# Patient Record
Sex: Male | Born: 1970 | Race: White | Hispanic: No | Smoking: Never smoker
Health system: Southern US, Community
[De-identification: ages and names within clinical notes are randomized; demographics above are authoritative.]

## PROBLEM LIST (undated history)

## (undated) DIAGNOSIS — I10 Essential (primary) hypertension: Secondary | ICD-10-CM

## (undated) DIAGNOSIS — H5440 Blindness, one eye, unspecified eye: Secondary | ICD-10-CM

## (undated) DIAGNOSIS — I209 Angina pectoris, unspecified: Secondary | ICD-10-CM

## (undated) DIAGNOSIS — F419 Anxiety disorder, unspecified: Secondary | ICD-10-CM

## (undated) DIAGNOSIS — K219 Gastro-esophageal reflux disease without esophagitis: Secondary | ICD-10-CM

## (undated) DIAGNOSIS — E119 Type 2 diabetes mellitus without complications: Secondary | ICD-10-CM

## (undated) DIAGNOSIS — I219 Acute myocardial infarction, unspecified: Secondary | ICD-10-CM

## (undated) DIAGNOSIS — E785 Hyperlipidemia, unspecified: Secondary | ICD-10-CM

## (undated) DIAGNOSIS — E113599 Type 2 diabetes mellitus with proliferative diabetic retinopathy without macular edema, unspecified eye: Secondary | ICD-10-CM

## (undated) DIAGNOSIS — H269 Unspecified cataract: Secondary | ICD-10-CM

## (undated) DIAGNOSIS — M7500 Adhesive capsulitis of unspecified shoulder: Secondary | ICD-10-CM

## (undated) HISTORY — DX: Blindness, one eye, unspecified eye: H54.40

## (undated) HISTORY — DX: Essential (primary) hypertension: I10

## (undated) HISTORY — DX: Unspecified cataract: H26.9

## (undated) HISTORY — DX: Adhesive capsulitis of unspecified shoulder: M75.00

## (undated) HISTORY — DX: Hyperlipidemia, unspecified: E78.5

## (undated) HISTORY — DX: Gastro-esophageal reflux disease without esophagitis: K21.9

## (undated) HISTORY — DX: Type 2 diabetes mellitus without complications: E11.9

## (undated) HISTORY — PX: CORONARY ARTERY BYPASS GRAFT: SHX141

## (undated) HISTORY — PX: APPENDECTOMY: SHX54

## (undated) HISTORY — DX: Acute myocardial infarction, unspecified: I21.9

## (undated) HISTORY — PX: EYE SURGERY: SHX253

## (undated) HISTORY — DX: Anxiety disorder, unspecified: F41.9

## (undated) HISTORY — PX: CARPAL TUNNEL RELEASE: SHX101

## (undated) HISTORY — DX: Type 2 diabetes mellitus with proliferative diabetic retinopathy without macular edema, unspecified eye: E11.3599

---

## 2004-05-24 ENCOUNTER — Inpatient Hospital Stay: Payer: Self-pay | Admitting: Surgery

## 2004-08-09 ENCOUNTER — Emergency Department: Payer: Self-pay | Admitting: General Practice

## 2005-06-03 ENCOUNTER — Ambulatory Visit: Payer: Self-pay | Admitting: Orthopedic Surgery

## 2006-03-30 ENCOUNTER — Inpatient Hospital Stay: Payer: Self-pay | Admitting: Internal Medicine

## 2007-08-11 HISTORY — PX: SHOULDER SURGERY: SHX246

## 2010-04-15 ENCOUNTER — Ambulatory Visit: Payer: Self-pay | Admitting: Family Medicine

## 2012-08-10 HISTORY — PX: OTHER SURGICAL HISTORY: SHX169

## 2013-05-29 DIAGNOSIS — E109 Type 1 diabetes mellitus without complications: Secondary | ICD-10-CM | POA: Insufficient documentation

## 2013-07-05 DIAGNOSIS — H2510 Age-related nuclear cataract, unspecified eye: Secondary | ICD-10-CM | POA: Insufficient documentation

## 2015-02-04 ENCOUNTER — Other Ambulatory Visit: Payer: Self-pay | Admitting: Family Medicine

## 2015-02-04 ENCOUNTER — Telehealth: Payer: Self-pay | Admitting: Family Medicine

## 2015-02-04 NOTE — Telephone Encounter (Signed)
Pt called needs refill on Tramadol. Pharm is [facility]. Thanks.

## 2015-02-05 MED ORDER — TRAMADOL HCL 50 MG PO TABS: 50.0000 mg | ORAL_TABLET | Freq: Four times a day (QID) | ORAL | Status: DC | PRN

## 2015-02-05 NOTE — Telephone Encounter (Signed)
rx

## 2015-02-05 NOTE — Telephone Encounter (Signed)
rx printed

## 2015-02-26 ENCOUNTER — Telehealth: Payer: Self-pay | Admitting: Family Medicine

## 2015-02-26 NOTE — Telephone Encounter (Signed)
Pt came in and stated that he had been notified that he has jury duty and he says that due to his condition he's unable to sit through it. Pt stated that for the first time the court is asking a hard copy of his excuse/doctors note. Pt would like a call back at [phone] when the letter/note can be picked up.

## 2015-03-08 ENCOUNTER — Telehealth: Payer: Self-pay

## 2015-03-08 NOTE — Telephone Encounter (Signed)
Pt called stated he needs refill on BP medication. Pharm is [facility]. Thanks.

## 2015-03-11 DIAGNOSIS — IMO0002 Reserved for concepts with insufficient information to code with codable children: Secondary | ICD-10-CM | POA: Insufficient documentation

## 2015-03-11 DIAGNOSIS — I10 Essential (primary) hypertension: Secondary | ICD-10-CM | POA: Insufficient documentation

## 2015-03-11 DIAGNOSIS — E1059 Type 1 diabetes mellitus with other circulatory complications: Secondary | ICD-10-CM | POA: Insufficient documentation

## 2015-03-11 DIAGNOSIS — K219 Gastro-esophageal reflux disease without esophagitis: Secondary | ICD-10-CM | POA: Insufficient documentation

## 2015-03-11 DIAGNOSIS — E1065 Type 1 diabetes mellitus with hyperglycemia: Secondary | ICD-10-CM | POA: Insufficient documentation

## 2015-03-11 DIAGNOSIS — E108 Type 1 diabetes mellitus with unspecified complications: Secondary | ICD-10-CM

## 2015-03-11 DIAGNOSIS — E785 Hyperlipidemia, unspecified: Secondary | ICD-10-CM | POA: Insufficient documentation

## 2015-03-11 NOTE — Telephone Encounter (Signed)
Diovan Rx to [facility]

## 2015-04-03 ENCOUNTER — Other Ambulatory Visit: Payer: Self-pay | Admitting: Family Medicine

## 2015-04-10 ENCOUNTER — Telehealth: Payer: Self-pay | Admitting: Family Medicine

## 2015-04-10 ENCOUNTER — Other Ambulatory Visit: Payer: Self-pay | Admitting: Family Medicine

## 2015-04-10 MED ORDER — TRAMADOL HCL 50 MG PO TABS: 100.0000 mg | ORAL_TABLET | Freq: Four times a day (QID) | ORAL | Status: DC | PRN

## 2015-04-10 NOTE — Telephone Encounter (Signed)
Tramadol has historically been written for 2 Tab Q6H - patient has run out Please send new Rx to [facility]

## 2015-04-10 NOTE — Telephone Encounter (Signed)
Pt called in and needed to clear up confusion with his tramadol rx, he thinks [facility] but hes not sure

## 2015-04-30 ENCOUNTER — Other Ambulatory Visit: Payer: Self-pay | Admitting: Family Medicine

## 2015-05-28 ENCOUNTER — Other Ambulatory Visit: Payer: Self-pay | Admitting: Family Medicine

## 2015-05-28 ENCOUNTER — Encounter: Payer: Self-pay | Admitting: Family Medicine

## 2015-05-28 ENCOUNTER — Ambulatory Visit (INDEPENDENT_AMBULATORY_CARE_PROVIDER_SITE_OTHER): Payer: 59 | Admitting: Family Medicine

## 2015-05-28 VITALS — BP 129/77 | HR 83 | Temp 98.5°F | Ht 65.2 in | Wt 196.0 lb

## 2015-05-28 DIAGNOSIS — R140 Abdominal distension (gaseous): Secondary | ICD-10-CM

## 2015-05-28 DIAGNOSIS — E1051 Type 1 diabetes mellitus with diabetic peripheral angiopathy without gangrene: Secondary | ICD-10-CM | POA: Insufficient documentation

## 2015-05-28 DIAGNOSIS — K219 Gastro-esophageal reflux disease without esophagitis: Secondary | ICD-10-CM | POA: Diagnosis not present

## 2015-05-28 DIAGNOSIS — E113599 Type 2 diabetes mellitus with proliferative diabetic retinopathy without macular edema, unspecified eye: Secondary | ICD-10-CM | POA: Insufficient documentation

## 2015-05-28 DIAGNOSIS — I10 Essential (primary) hypertension: Secondary | ICD-10-CM | POA: Diagnosis not present

## 2015-05-28 DIAGNOSIS — E108 Type 1 diabetes mellitus with unspecified complications: Secondary | ICD-10-CM

## 2015-05-28 DIAGNOSIS — E1065 Type 1 diabetes mellitus with hyperglycemia: Secondary | ICD-10-CM | POA: Diagnosis not present

## 2015-05-28 DIAGNOSIS — E785 Hyperlipidemia, unspecified: Secondary | ICD-10-CM | POA: Diagnosis not present

## 2015-05-28 DIAGNOSIS — Z23 Encounter for immunization: Secondary | ICD-10-CM | POA: Diagnosis not present

## 2015-05-28 DIAGNOSIS — IMO0002 Reserved for concepts with insufficient information to code with codable children: Secondary | ICD-10-CM

## 2015-05-28 DIAGNOSIS — E103553 Type 1 diabetes mellitus with stable proliferative diabetic retinopathy, bilateral: Secondary | ICD-10-CM | POA: Insufficient documentation

## 2015-05-28 DIAGNOSIS — R109 Unspecified abdominal pain: Secondary | ICD-10-CM

## 2015-05-28 DIAGNOSIS — E103599 Type 1 diabetes mellitus with proliferative diabetic retinopathy without macular edema, unspecified eye: Secondary | ICD-10-CM

## 2015-05-28 DIAGNOSIS — Z0000 Encounter for general adult medical examination without abnormal findings: Secondary | ICD-10-CM

## 2015-05-28 LAB — URINALYSIS, ROUTINE W REFLEX MICROSCOPIC
Bilirubin, UA: NEGATIVE
Ketones, UA: NEGATIVE
LEUKOCYTES UA: NEGATIVE
Nitrite, UA: NEGATIVE
PH UA: 5 (ref 5.0–7.5)
Protein, UA: NEGATIVE
Specific Gravity, UA: 1.005 (ref 1.005–1.030)
Urobilinogen, Ur: 0.2 mg/dL (ref 0.2–1.0)

## 2015-05-28 LAB — BAYER DCA HB A1C WAIVED: HB A1C (BAYER DCA - WAIVED): 9.4 % — ABNORMAL HIGH (ref <7.0)

## 2015-05-28 LAB — MICROSCOPIC EXAMINATION

## 2015-05-28 MED ORDER — INSULIN LISPRO 100 UNIT/ML (KWIKPEN): 20.0000 [IU] | PEN_INJECTOR | Freq: Three times a day (TID) | SUBCUTANEOUS | Status: DC

## 2015-05-28 MED ORDER — ESOMEPRAZOLE MAGNESIUM 40 MG PO PACK: 40.0000 mg | PACK | Freq: Every day | ORAL | Status: DC

## 2015-05-28 MED ORDER — VALSARTAN 320 MG PO TABS: 320.0000 mg | ORAL_TABLET | Freq: Every day | ORAL | Status: DC

## 2015-05-28 MED ORDER — INSULIN GLARGINE 100 UNIT/ML SOLOSTAR PEN: 60.0000 [IU] | PEN_INJECTOR | Freq: Every day | SUBCUTANEOUS | Status: DC

## 2015-05-28 MED ORDER — DAPAGLIFLOZIN PROPANEDIOL 10 MG PO TABS: 10.0000 mg | ORAL_TABLET | Freq: Every day | ORAL | Status: DC

## 2015-05-28 MED ORDER — ATORVASTATIN CALCIUM 20 MG PO TABS: 20.0000 mg | ORAL_TABLET | Freq: Every day | ORAL | Status: DC

## 2015-05-28 NOTE — Assessment & Plan Note (Signed)
Reviewed with patient's hemoglobin A1c today of 9.4 Discussed need for better control for life Will refer back to endocrinology to further optimize care She indicates he will also try harder

## 2015-05-28 NOTE — Progress Notes (Signed)
BP 129/77 mmHg  Pulse 83  Temp(Src) 98.5 F (36.9 C)  Ht 5' 5.2" (1.656 m)  Wt 196 lb (88.905 kg)  BMI 32.42 kg/m2  SpO2 99%   Subjective:    Patient ID: Mike Kline, male    DOB: [birth year], 44 y.o.   MRN: [id number]  HPI: DOMINICO ROD is a 44 y.o. male  Chief Complaint  Patient presents with  . Annual Exam   doing poorly diabetes going up and down adjusting insulin to carbohydrate intake Still having a great deal of difficulty adjusting insulin and getting good glucose control. 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   Relevant past medical, surgical, family and social history reviewed and updated as indicated. Interim medical history since our last visit reviewed. Allergies and medications reviewed and updated.  Other than noted above  Review of Systems  Constitutional: Negative.   HENT: Negative.   Eyes: Negative.   Respiratory: Negative.   Cardiovascular: Negative.   Gastrointestinal: Negative.   Endocrine: Positive for polyuria.  Genitourinary: Negative.   Musculoskeletal: Positive for joint swelling and gait problem.       Patient unaware if he has claudication as he can't walk very much  Skin: Negative.   Allergic/Immunologic: Negative.   Neurological: Negative.   Hematological: Negative.   Psychiatric/Behavioral: Negative.     Per HPI unless specifically indicated above     Objective:    BP 129/77 mmHg  Pulse 83  Temp(Src) 98.5 F (36.9 C)  Ht 5' 5.2" (1.656 m)  Wt 196 lb (88.905 kg)  BMI 32.42 kg/m2  SpO2 99%  Wt Readings from Last 3 Encounters:  05/28/15 196 lb (88.905 kg)  11/22/14 189 lb (85.73 kg)    Physical Exam  Constitutional: He is oriented to person, place, and time. He appears  well-developed and well-nourished.  HENT:  Head: Normocephalic and atraumatic.  Right Ear: External ear normal.  Left Ear: External ear normal.  Eyes: Conjunctivae and EOM are normal. Pupils are equal, round, and reactive to light.  Neck: Normal range of motion. Neck supple.  Cardiovascular: Normal rate, regular rhythm, normal heart sounds and intact distal pulses.   Pulmonary/Chest: Effort normal and breath sounds normal.  Abdominal: Soft. Bowel sounds are normal. There is no splenomegaly or hepatomegaly.  Genitourinary: Rectum normal, prostate normal and penis normal.  Musculoskeletal:  Patient very stiff and inflexible unable to bend enough to reach his feet. Absent peripheral pulses  Neurological: He is alert and oriented to person, place, and time. He has normal reflexes.  Markedly decreased sensation both feet  Skin: No rash noted. No erythema.  Psychiatric: He has a normal mood and affect. His behavior is normal. Judgment and thought content normal.    No results found for this or any previous visit.    Assessment & Plan:   Problem List Items Addressed This Visit      Cardiovascular and Mediastinum   Hypertension    The current medical regimen is effective;  continue present plan and medications.       Relevant Medications   atorvastatin (LIPITOR) 20 MG tablet   valsartan (DIOVAN) 320 MG tablet   Diabetes mellitus type 1 with peripheral artery disease (HCC)    Patient with absent  peripheral pulses will refer to [place] [facility] to further evaluate for PAD      Relevant Medications   atorvastatin (LIPITOR) 20 MG tablet   dapagliflozin propanediol (FARXIGA) 10 MG TABS tablet   insulin lispro (HUMALOG KWIKPEN) 100 UNIT/ML KiwkPen   Insulin Glargine (LANTUS SOLOSTAR) 100 UNIT/ML Solostar Pen   valsartan (DIOVAN) 320 MG tablet   Other Relevant Orders   Ambulatory referral to Vascular Surgery     Digestive   GERD (gastroesophageal reflux disease)     Discuss abdominal pain will start with abdominal ultrasound      Relevant Medications   esomeprazole (NEXIUM) 40 MG packet   Other Relevant Orders   US Abdomen Complete     Endocrine   Type I diabetes mellitus with manifestations, uncontrolled (HCC)    Reviewed with patient's hemoglobin A1c today of 9.4 Discussed need for better control for life Will refer back to endocrinology to further optimize care She indicates he will also try harder       Relevant Medications   atorvastatin (LIPITOR) 20 MG tablet   dapagliflozin propanediol (FARXIGA) 10 MG TABS tablet   insulin lispro (HUMALOG KWIKPEN) 100 UNIT/ML KiwkPen   Insulin Glargine (LANTUS SOLOSTAR) 100 UNIT/ML Solostar Pen   valsartan (DIOVAN) 320 MG tablet   Other Relevant Orders   Ambulatory referral to Endocrinology     Other   Hyperlipidemia    The current medical regimen is effective;  continue present plan and medications.       Relevant Medications   atorvastatin (LIPITOR) 20 MG tablet   valsartan (DIOVAN) 320 MG tablet   Diabetic, retinopathy, proliferative (HCC)   Relevant Medications   atorvastatin (LIPITOR) 20 MG tablet   dapagliflozin propanediol (FARXIGA) 10 MG TABS tablet   insulin lispro (HUMALOG KWIKPEN) 100 UNIT/ML KiwkPen   Insulin Glargine (LANTUS SOLOSTAR) 100 UNIT/ML Solostar Pen   valsartan (DIOVAN) 320 MG tablet   Other Relevant Orders   Bayer DCA Hb A1c Waived    Other Visit Diagnoses    Immunization due    -  Primary    Relevant Orders    Flu Vaccine QUAD 36+ mos PF IM (Fluarix & Fluzone Quad PF) (Completed)    PE (physical exam), annual        Relevant Orders    Comprehensive metabolic panel    Lipid panel    CBC with Differential/Platelet    Urinalysis, Routine w reflex microscopic (not at [facility])    TSH        Follow up plan: No Follow-up on file.

## 2015-05-28 NOTE — Assessment & Plan Note (Signed)
Patient with absent peripheral pulses will refer to [place] [facility] to further evaluate for PAD

## 2015-05-28 NOTE — Assessment & Plan Note (Signed)
Discuss abdominal pain will start with abdominal ultrasound

## 2015-05-28 NOTE — Assessment & Plan Note (Signed)
The current medical regimen is effective;  continue present plan and medications.  

## 2015-05-29 ENCOUNTER — Encounter: Payer: Self-pay | Admitting: Family Medicine

## 2015-05-29 LAB — COMPREHENSIVE METABOLIC PANEL
ALBUMIN: 4.3 g/dL (ref 3.5–5.5)
ALT: 25 IU/L (ref 0–44)
AST: 24 IU/L (ref 0–40)
Albumin/Globulin Ratio: 1.8 (ref 1.1–2.5)
Alkaline Phosphatase: 109 IU/L (ref 39–117)
BUN / CREAT RATIO: 14 (ref 9–20)
BUN: 15 mg/dL (ref 6–24)
Bilirubin Total: 0.2 mg/dL (ref 0.0–1.2)
CALCIUM: 9.4 mg/dL (ref 8.7–10.2)
CO2: 24 mmol/L (ref 18–29)
CREATININE: 1.05 mg/dL (ref 0.76–1.27)
Chloride: 101 mmol/L (ref 97–106)
GFR, EST AFRICAN AMERICAN: 98 mL/min/{1.73_m2} (ref >59)
GFR, EST NON AFRICAN AMERICAN: 85 mL/min/{1.73_m2} (ref >59)
GLOBULIN, TOTAL: 2.4 g/dL (ref 1.5–4.5)
GLUCOSE: 125 mg/dL — AB (ref 65–99)
Potassium: 4.3 mmol/L (ref 3.5–5.2)
SODIUM: 139 mmol/L (ref 136–144)
TOTAL PROTEIN: 6.7 g/dL (ref 6.0–8.5)

## 2015-05-29 LAB — CBC WITH DIFFERENTIAL/PLATELET
BASOS ABS: 0 10*3/uL (ref 0.0–0.2)
BASOS: 0 %
EOS (ABSOLUTE): 0.2 10*3/uL (ref 0.0–0.4)
Eos: 3 %
Hematocrit: 43.9 % (ref 37.5–51.0)
Hemoglobin: 14.3 g/dL (ref 12.6–17.7)
IMMATURE GRANS (ABS): 0 10*3/uL (ref 0.0–0.1)
IMMATURE GRANULOCYTES: 1 %
LYMPHS: 33 %
Lymphocytes Absolute: 1.9 10*3/uL (ref 0.7–3.1)
MCH: 27.1 pg (ref 26.6–33.0)
MCHC: 32.6 g/dL (ref 31.5–35.7)
MCV: 83 fL (ref 79–97)
MONOS ABS: 0.3 10*3/uL (ref 0.1–0.9)
Monocytes: 5 %
NEUTROS PCT: 58 %
Neutrophils Absolute: 3.5 10*3/uL (ref 1.4–7.0)
PLATELETS: 270 10*3/uL (ref 150–379)
RBC: 5.28 x10E6/uL (ref 4.14–5.80)
RDW: 14.3 % (ref 12.3–15.4)
WBC: 5.9 10*3/uL (ref 3.4–10.8)

## 2015-05-29 LAB — LIPID PANEL
CHOL/HDL RATIO: 3.8 ratio (ref 0.0–5.0)
CHOLESTEROL TOTAL: 204 mg/dL — AB (ref 100–199)
HDL: 54 mg/dL (ref >39)
LDL CALC: 133 mg/dL — AB (ref 0–99)
Triglycerides: 87 mg/dL (ref 0–149)
VLDL CHOLESTEROL CAL: 17 mg/dL (ref 5–40)

## 2015-05-29 LAB — TSH: TSH: 1.57 u[IU]/mL (ref 0.450–4.500)

## 2015-06-24 ENCOUNTER — Telehealth: Payer: Self-pay | Admitting: Family Medicine

## 2015-06-24 MED ORDER — TRAMADOL HCL 50 MG PO TABS: 100.0000 mg | ORAL_TABLET | Freq: Four times a day (QID) | ORAL | Status: DC | PRN

## 2015-06-24 NOTE — Telephone Encounter (Signed)
Forward to provider

## 2015-06-24 NOTE — Telephone Encounter (Signed)
Pt needs Tramadol sent to [facility].

## 2015-06-25 ENCOUNTER — Ambulatory Visit: Payer: 59 | Admitting: Family Medicine

## 2015-07-02 ENCOUNTER — Ambulatory Visit: Payer: 59 | Admitting: Family Medicine

## 2015-07-15 ENCOUNTER — Ambulatory Visit: Payer: Self-pay

## 2015-07-23 ENCOUNTER — Encounter: Payer: Self-pay | Admitting: Family Medicine

## 2015-07-23 ENCOUNTER — Telehealth: Payer: Self-pay

## 2015-07-23 ENCOUNTER — Ambulatory Visit (INDEPENDENT_AMBULATORY_CARE_PROVIDER_SITE_OTHER): Payer: 59 | Admitting: Family Medicine

## 2015-07-23 VITALS — BP 113/71 | HR 86 | Temp 98.5°F | Ht 65.2 in | Wt 199.0 lb

## 2015-07-23 DIAGNOSIS — R1012 Left upper quadrant pain: Secondary | ICD-10-CM

## 2015-07-23 DIAGNOSIS — J019 Acute sinusitis, unspecified: Secondary | ICD-10-CM

## 2015-07-23 DIAGNOSIS — J329 Chronic sinusitis, unspecified: Secondary | ICD-10-CM | POA: Insufficient documentation

## 2015-07-23 DIAGNOSIS — R109 Unspecified abdominal pain: Secondary | ICD-10-CM | POA: Insufficient documentation

## 2015-07-23 MED ORDER — DEXLANSOPRAZOLE 60 MG PO CPDR: 60.0000 mg | DELAYED_RELEASE_CAPSULE | Freq: Every day | ORAL | Status: DC

## 2015-07-23 MED ORDER — AMOXICILLIN 875 MG PO TABS: 875.0000 mg | ORAL_TABLET | Freq: Two times a day (BID) | ORAL | Status: DC

## 2015-07-23 NOTE — Progress Notes (Signed)
BP 113/71 mmHg  Pulse 86  Temp(Src) 98.5 F (36.9 C)  Ht 5' 5.2" (1.656 m)  Wt 199 lb (90.266 kg)  BMI 32.92 kg/m2  SpO2 99%   Subjective:    Patient ID: Mike Kline, male    DOB: 05/18/1971, 44 y.o.   MRN: [id number]  HPI: Mike Kline is a 44 y.o. male  Chief Complaint  Patient presents with  . URI  . Gastroesophageal Reflux   patient recheck has several issues For 2-3 weeks been having a lot of head cold drainage congestion and facial pain feeling bad cough cold some low-grade fever and getting worse.  Patient's abdominal pain continuing has to take Tums and excellent to keep pain at bay had been doing a little better and canceled ultrasounds but is going to reschedule that  Relevant past medical, surgical, family and social history reviewed and updated as indicated. Interim medical history since our last visit reviewed. Allergies and medications reviewed and updated.  Review of Systems  Constitutional: Positive for fatigue. Negative for fever.  HENT: Positive for congestion, rhinorrhea, sinus pressure, sneezing and sore throat.   Respiratory: Positive for cough.   Cardiovascular: Negative.   Gastrointestinal: Positive for abdominal pain and abdominal distention.    Per HPI unless specifically indicated above     Objective:    BP 113/71 mmHg  Pulse 86  Temp(Src) 98.5 F (36.9 C)  Ht 5' 5.2" (1.656 m)  Wt 199 lb (90.266 kg)  BMI 32.92 kg/m2  SpO2 99%  Wt Readings from Last 3 Encounters:  07/23/15 199 lb (90.266 kg)  05/28/15 196 lb (88.905 kg)  11/22/14 189 lb (85.73 kg)    Physical Exam  Constitutional: He is oriented to person, place, and time. He appears well-developed and well-nourished. No distress.  HENT:  Head: Normocephalic and atraumatic.  Right Ear: Hearing and external ear normal.  Left Ear: Hearing and external ear normal.  Nose: Nose normal.  Mouth/Throat: Oropharyngeal exudate present.  Eyes: Conjunctivae and lids are normal. Right eye  exhibits no discharge. Left eye exhibits no discharge. No scleral icterus.  Pulmonary/Chest: Effort normal. No respiratory distress.  Abdominal:  Abdomen tender left upper quadrant  Musculoskeletal: Normal range of motion.  Neurological: He is alert and oriented to person, place, and time.  Skin: Skin is intact. No rash noted.  Psychiatric: He has a normal mood and affect. His speech is normal and behavior is normal. Judgment and thought content normal. Cognition and memory are normal.    Results for orders placed or performed in visit on 05/28/15  Microscopic Examination  Result Value Ref Range   WBC, UA 0-5 0 -  5 /hpf   RBC, UA 0-2 0 -  2 /hpf   Epithelial Cells (non renal) 0-10 0 - 10 /hpf   Bacteria, UA Few None seen/Few  Bayer DCA Hb A1c Waived  Result Value Ref Range   Bayer DCA Hb A1c Waived 9.4 (H) <7.0 %  Comprehensive metabolic panel  Result Value Ref Range   Glucose 125 (H) 65 - 99 mg/dL   BUN 15 6 - 24 mg/dL   Creatinine, Ser 7.821.05 0.76 - 1.27 mg/dL   GFR calc non Af Amer 85 >59 mL/min/1.73   GFR calc Af Amer 98 >59 mL/min/1.73   BUN/Creatinine Ratio 14 9 - 20   Sodium 139 136 - 144 mmol/L   Potassium 4.3 3.5 - 5.2 mmol/L   Chloride 101 97 - 106 mmol/L   CO2  24 18 - 29 mmol/L   Calcium 9.4 8.7 - 10.2 mg/dL   Total Protein 6.7 6.0 - 8.5 g/dL   Albumin 4.3 3.5 - 5.5 g/dL   Globulin, Total 2.4 1.5 - 4.5 g/dL   Albumin/Globulin Ratio 1.8 1.1 - 2.5   Bilirubin Total 0.2 0.0 - 1.2 mg/dL   Alkaline Phosphatase 109 39 - 117 IU/L   AST 24 0 - 40 IU/L   ALT 25 0 - 44 IU/L  Lipid panel  Result Value Ref Range   Cholesterol, Total 204 (H) 100 - 199 mg/dL   Triglycerides 87 0 - 149 mg/dL   HDL 54 >16 mg/dL   VLDL Cholesterol Cal 17 5 - 40 mg/dL   LDL Calculated 109 (H) 0 - 99 mg/dL   Chol/HDL Ratio 3.8 0.0 - 5.0 ratio units  CBC with Differential/Platelet  Result Value Ref Range   WBC 5.9 3.4 - 10.8 x10E3/uL   RBC 5.28 4.14 - 5.80 x10E6/uL   Hemoglobin 14.3 12.6 -  17.7 g/dL   Hematocrit 60.4 54.0 - 51.0 %   MCV 83 79 - 97 fL   MCH 27.1 26.6 - 33.0 pg   MCHC 32.6 31.5 - 35.7 g/dL   RDW 98.1 19.1 - 47.8 %   Platelets 270 150 - 379 x10E3/uL   Neutrophils 58 %   Lymphs 33 %   Monocytes 5 %   Eos 3 %   Basos 0 %   Neutrophils Absolute 3.5 1.4 - 7.0 x10E3/uL   Lymphocytes Absolute 1.9 0.7 - 3.1 x10E3/uL   Monocytes Absolute 0.3 0.1 - 0.9 x10E3/uL   EOS (ABSOLUTE) 0.2 0.0 - 0.4 x10E3/uL   Basophils Absolute 0.0 0.0 - 0.2 x10E3/uL   Immature Granulocytes 1 %   Immature Grans (Abs) 0.0 0.0 - 0.1 x10E3/uL  Urinalysis, Routine w reflex microscopic (not at [facility])  Result Value Ref Range   Specific Gravity, UA 1.005 1.005 - 1.030   pH, UA 5.0 5.0 - 7.5   Color, UA Yellow Yellow   Appearance Ur Clear Clear   Leukocytes, UA Negative Negative   Protein, UA Negative Negative/Trace   Glucose, UA 3+ (A) Negative   Ketones, UA Negative Negative   RBC, UA Trace (A) Negative   Bilirubin, UA Negative Negative   Urobilinogen, Ur 0.2 0.2 - 1.0 mg/dL   Nitrite, UA Negative Negative   Microscopic Examination See below:   TSH  Result Value Ref Range   TSH 1.570 0.450 - 4.500 uIU/mL      Assessment & Plan:   Problem List Items Addressed This Visit      Respiratory   Sinusitis    Discussed care and treatment of sinusitis as of over-the-counter medications Antibiotics      Relevant Medications   amoxicillin (AMOXIL) 875 MG tablet     Other   Abdominal pain - Primary    Discuss abdominal pain will order H pylori test Proceed with ultrasound If all negative will consider abdominal CT      Relevant Orders   Helicobacter pylori abs-IgG+IgA, bld       Follow up plan: Return in about 3 months (around 10/21/2015) for BMP, lipid panel, AST, ALT.

## 2015-07-23 NOTE — Telephone Encounter (Signed)
Rx request for Dexilant DR 60 mg caps.  Dexilant is NOT on the formulary. The rx was originally written 11/14/2014. It was last filled 05/16/2015.  You will have to reorder this Rx & override notice about it not being covered.

## 2015-07-23 NOTE — Assessment & Plan Note (Signed)
Discuss abdominal pain will order H pylori test Proceed with ultrasound If all negative will consider abdominal CT

## 2015-07-23 NOTE — Assessment & Plan Note (Signed)
Discussed care and treatment of sinusitis as of over-the-counter medications Antibiotics

## 2015-07-25 LAB — HELICOBACTER PYLORI ABS-IGG+IGA, BLD

## 2015-07-26 ENCOUNTER — Ambulatory Visit
Admission: RE | Admit: 2015-07-26 | Discharge: 2015-07-26 | Disposition: A | Discharge status: Home / Self Care | Payer: 59 | Source: Ambulatory Visit | Attending: Family Medicine | Admitting: Family Medicine

## 2015-07-26 DIAGNOSIS — K219 Gastro-esophageal reflux disease without esophagitis: Secondary | ICD-10-CM | POA: Diagnosis not present

## 2015-07-26 DIAGNOSIS — R140 Abdominal distension (gaseous): Secondary | ICD-10-CM

## 2015-07-26 DIAGNOSIS — R109 Unspecified abdominal pain: Secondary | ICD-10-CM | POA: Insufficient documentation

## 2015-07-29 ENCOUNTER — Telehealth: Payer: Self-pay | Admitting: Family Medicine

## 2015-07-29 DIAGNOSIS — R1012 Left upper quadrant pain: Secondary | ICD-10-CM

## 2015-07-29 NOTE — Telephone Encounter (Signed)
-----   Message from Lurlean HornsNancy H Wilson, CMA sent at 07/29/2015  5:08 PM EST ----- labs

## 2015-07-29 NOTE — Telephone Encounter (Signed)
Phone call Discussed with patient still having marked left upper quadrant abdominal pain ultrasound unrevealing essentially will schedule abdominal CT Most likely diagnosis though is gastroparesis

## 2015-08-09 ENCOUNTER — Ambulatory Visit
Admission: RE | Admit: 2015-08-09 | Discharge: 2015-08-09 | Disposition: A | Discharge status: Home / Self Care | Payer: 59 | Source: Ambulatory Visit | Attending: Family Medicine | Admitting: Family Medicine

## 2015-08-09 DIAGNOSIS — R1012 Left upper quadrant pain: Secondary | ICD-10-CM

## 2015-08-09 DIAGNOSIS — I2510 Atherosclerotic heart disease of native coronary artery without angina pectoris: Secondary | ICD-10-CM | POA: Insufficient documentation

## 2015-08-09 MED ORDER — IOHEXOL 350 MG/ML SOLN
100.0000 mL | Freq: Once | INTRAVENOUS | Status: AC | PRN
  Administered 2015-08-09: 100 mL via INTRAVENOUS

## 2015-08-13 ENCOUNTER — Telehealth: Payer: Self-pay | Admitting: Family Medicine

## 2015-08-13 NOTE — Telephone Encounter (Signed)
-----   Message from Lurlean HornsNancy H Wilson, CMA sent at 08/13/2015  5:11 PM EST ----- ct

## 2015-08-13 NOTE — Telephone Encounter (Signed)
On call Discussed with patient is artery gotten CT report from endocrinology and is using laxatives feeling much better

## 2015-08-16 ENCOUNTER — Telehealth: Payer: Self-pay | Admitting: Family Medicine

## 2015-08-16 NOTE — Telephone Encounter (Signed)
call

## 2015-08-16 NOTE — Telephone Encounter (Signed)
discuss cont care with uri and no more antibiotcs

## 2015-08-16 NOTE — Telephone Encounter (Signed)
Pt called stated his illness is still lingering wants to know if more antibiotics can be called in to [facility]. Thanks.

## 2015-10-07 ENCOUNTER — Telehealth: Payer: Self-pay | Admitting: Family Medicine

## 2015-10-07 NOTE — Telephone Encounter (Signed)
Pt called stated he needs a refill on Tramadol. Pharm is [facility].  Pt also needs Humalog sent to [facility].    Thanks.

## 2015-10-08 MED ORDER — TRAMADOL HCL 50 MG PO TABS: 100.0000 mg | ORAL_TABLET | Freq: Four times a day (QID) | ORAL | Status: DC | PRN

## 2015-10-08 NOTE — Telephone Encounter (Signed)
Pt called and informed RX is ready for pick up. Thanks.

## 2015-10-22 ENCOUNTER — Encounter: Payer: Self-pay | Admitting: Family Medicine

## 2015-10-22 ENCOUNTER — Telehealth: Payer: Self-pay | Admitting: Gastroenterology

## 2015-10-22 ENCOUNTER — Ambulatory Visit (INDEPENDENT_AMBULATORY_CARE_PROVIDER_SITE_OTHER): Payer: 59 | Admitting: Family Medicine

## 2015-10-22 VITALS — BP 121/74 | HR 84 | Temp 98.0°F | Ht 64.4 in | Wt 204.0 lb

## 2015-10-22 DIAGNOSIS — E7800 Pure hypercholesterolemia, unspecified: Secondary | ICD-10-CM | POA: Insufficient documentation

## 2015-10-22 DIAGNOSIS — I10 Essential (primary) hypertension: Secondary | ICD-10-CM | POA: Diagnosis not present

## 2015-10-22 DIAGNOSIS — E1051 Type 1 diabetes mellitus with diabetic peripheral angiopathy without gangrene: Secondary | ICD-10-CM

## 2015-10-22 DIAGNOSIS — E785 Hyperlipidemia, unspecified: Secondary | ICD-10-CM

## 2015-10-22 DIAGNOSIS — E103599 Type 1 diabetes mellitus with proliferative diabetic retinopathy without macular edema, unspecified eye: Secondary | ICD-10-CM | POA: Diagnosis not present

## 2015-10-22 DIAGNOSIS — R1012 Left upper quadrant pain: Secondary | ICD-10-CM | POA: Diagnosis not present

## 2015-10-22 LAB — LP+ALT+AST PICCOLO, WAIVED
ALT (SGPT) PICCOLO, WAIVED: 21 U/L (ref 10–47)
AST (SGOT) PICCOLO, WAIVED: 35 U/L (ref 11–38)
CHOL/HDL RATIO PICCOLO,WAIVE: 4.3 mg/dL
Cholesterol Piccolo, Waived: 182 mg/dL (ref <200)
HDL Chol Piccolo, Waived: 42 mg/dL — ABNORMAL LOW (ref >59)
LDL Chol Calc Piccolo Waived: 100 mg/dL — ABNORMAL HIGH (ref <100)
TRIGLYCERIDES PICCOLO,WAIVED: 196 mg/dL — AB (ref <150)
VLDL CHOL CALC PICCOLO,WAIVE: 39 mg/dL — AB (ref <30)

## 2015-10-22 LAB — BAYER DCA HB A1C WAIVED: HB A1C: 8.3 % — AB (ref <7.0)

## 2015-10-22 MED ORDER — ATORVASTATIN CALCIUM 40 MG PO TABS: 40.0000 mg | ORAL_TABLET | Freq: Every day | ORAL | Status: DC

## 2015-10-22 NOTE — Assessment & Plan Note (Signed)
Discuss poor control of cholesterol still will increase Lipitor from 20mg  to 40mg 

## 2015-10-22 NOTE — Assessment & Plan Note (Signed)
Has not appointment in a few weeks.

## 2015-10-22 NOTE — Assessment & Plan Note (Signed)
Continued nonspecific abdominal pain possibility of gastroparesis discussed Will refer to gastroenterology to further evaluate.

## 2015-10-22 NOTE — Telephone Encounter (Signed)
I have called patient to make an appointment per referral for left upper quadrant pain. No answer. I have left a message on voicemail. Patient will need an appointment with GI.

## 2015-10-22 NOTE — Assessment & Plan Note (Signed)
Followed by endocrinology 

## 2015-10-22 NOTE — Assessment & Plan Note (Signed)
The current medical regimen is effective;  continue present plan and medications.  

## 2015-10-22 NOTE — Progress Notes (Signed)
BP 121/74 mmHg  Pulse 84  Temp(Src) 98 F (36.7 C)  Ht 5' 4.4" (1.636 m)  Wt 204 lb (92.534 kg)  BMI 34.57 kg/m2  SpO2 96%   Subjective:    Patient ID: Mike Kline, male    DOB: Oct 27, 1970, 45 y.o.   MRN: [id number]  HPI: Mike Kline is a 45 y.o. male  Chief Complaint  Patient presents with  . Diabetes  . Hyperlipidemia  . Hypertension  . upper abdominal pain   follow-up having continued left upper quadrant abdominal pain which bothers him sometimes it's debilitating so severe last couple hours reviewed testing labs which were all normal.   Patient following with endocrinology for diabetes has appointment in a couple weeks once A1c done here so that it will be ready for his endocrinology appointment. Patient's been doing better with his insulin  And also doing well with blood pressure and cholesterol. Relevant past medical, surgical, family and social history reviewed and updated as indicated. Interim medical history since our last visit reviewed. Allergies and medications reviewed and updated.  Review of Systems  Constitutional: Negative.   Respiratory: Negative.   Cardiovascular: Negative.     Per HPI unless specifically indicated above     Objective:    BP 121/74 mmHg  Pulse 84  Temp(Src) 98 F (36.7 C)  Ht 5' 4.4" (1.636 m)  Wt 204 lb (92.534 kg)  BMI 34.57 kg/m2  SpO2 96%  Wt Readings from Last 3 Encounters:  10/22/15 204 lb (92.534 kg)  07/23/15 199 lb (90.266 kg)  05/28/15 196 lb (88.905 kg)    Physical Exam  Constitutional: He is oriented to person, place, and time. He appears well-developed and well-nourished. No distress.  HENT:  Head: Normocephalic and atraumatic.  Right Ear: Hearing normal.  Left Ear: Hearing normal.  Nose: Nose normal.  Eyes: Conjunctivae and lids are normal. Right eye exhibits no discharge. Left eye exhibits no discharge. No scleral icterus.  Cardiovascular: Normal rate, regular rhythm and normal heart sounds.    Pulmonary/Chest: Effort normal and breath sounds normal. No respiratory distress.  Musculoskeletal: Normal range of motion.  Neurological: He is alert and oriented to person, place, and time.  Skin: Skin is intact. No rash noted.  Psychiatric: He has a normal mood and affect. His speech is normal and behavior is normal. Judgment and thought content normal. Cognition and memory are normal.    Results for orders placed or performed in visit on 07/23/15  Helicobacter pylori abs-IgG+IgA, bld  Result Value Ref Range   H. pylori, IgA Abs <9.0 0.0 - 8.9 units      Assessment & Plan:   Problem List Items Addressed This Visit      Cardiovascular and Mediastinum   Hypertension    The current medical regimen is effective;  continue present plan and medications.       Relevant Medications   atorvastatin (LIPITOR) 40 MG tablet   Other Relevant Orders   LP+ALT+AST Piccolo, Waived   Bayer DCA Hb A1c Waived   Basic metabolic panel   Diabetes mellitus type 1 with peripheral artery disease (HCC) - Primary    Followed by endocrinology      Relevant Medications   atorvastatin (LIPITOR) 40 MG tablet   Other Relevant Orders   LP+ALT+AST Piccolo, Waived   Bayer DCA Hb A1c Waived   Basic metabolic panel     Other   Hyperlipidemia    Discuss poor control of cholesterol still will  increase Lipitor from 20mg  to 40mg       Relevant Medications   atorvastatin (LIPITOR) 40 MG tablet   Other Relevant Orders   LP+ALT+AST Piccolo, Waived   Bayer DCA Hb A1c Waived   Basic metabolic panel   Diabetic, retinopathy, proliferative (HCC)    Has not appointment in a few weeks.      Relevant Medications   atorvastatin (LIPITOR) 40 MG tablet   Abdominal pain    Continued nonspecific abdominal pain possibility of gastroparesis discussed Will refer to gastroenterology to further evaluate.      Relevant Orders   Ambulatory referral to Gastroenterology   Hypercholesterolemia   Relevant Medications    atorvastatin (LIPITOR) 40 MG tablet       Follow up plan: Return in about 6 months (around 04/23/2016) for Physical Exam.

## 2015-10-23 ENCOUNTER — Encounter: Payer: Self-pay | Admitting: Family Medicine

## 2015-10-23 LAB — BASIC METABOLIC PANEL
BUN/Creatinine Ratio: 14 (ref 9–20)
BUN: 13 mg/dL (ref 6–24)
CALCIUM: 9.5 mg/dL (ref 8.7–10.2)
CHLORIDE: 99 mmol/L (ref 96–106)
CO2: 23 mmol/L (ref 18–29)
Creatinine, Ser: 0.95 mg/dL (ref 0.76–1.27)
GFR calc non Af Amer: 96 mL/min/{1.73_m2} (ref >59)
GFR, EST AFRICAN AMERICAN: 111 mL/min/{1.73_m2} (ref >59)
Glucose: 65 mg/dL (ref 65–99)
POTASSIUM: 4.5 mmol/L (ref 3.5–5.2)
SODIUM: 140 mmol/L (ref 134–144)

## 2015-10-23 NOTE — Telephone Encounter (Signed)
appointment made with Dr Servando SnareWohl.

## 2015-10-30 ENCOUNTER — Other Ambulatory Visit: Payer: Self-pay | Admitting: Family Medicine

## 2015-11-11 ENCOUNTER — Other Ambulatory Visit: Payer: Self-pay | Admitting: Family Medicine

## 2015-11-11 ENCOUNTER — Telehealth: Payer: Self-pay

## 2015-11-11 NOTE — Telephone Encounter (Signed)
[facility]

## 2015-11-12 ENCOUNTER — Other Ambulatory Visit: Payer: Self-pay | Admitting: Family Medicine

## 2015-11-13 ENCOUNTER — Telehealth: Payer: Self-pay | Admitting: Family Medicine

## 2015-11-13 NOTE — Telephone Encounter (Signed)
Pt notified. Thanks.

## 2015-11-13 NOTE — Telephone Encounter (Signed)
Pt called stated he needs a refill on Tramadol. Please call when RX is ready for pick up. Thanks.

## 2015-11-21 ENCOUNTER — Encounter: Payer: Self-pay | Admitting: Gastroenterology

## 2015-11-21 ENCOUNTER — Ambulatory Visit (INDEPENDENT_AMBULATORY_CARE_PROVIDER_SITE_OTHER): Payer: 59 | Admitting: Gastroenterology

## 2015-11-21 VITALS — BP 139/70 | HR 80 | Temp 98.2°F | Ht 64.0 in | Wt 202.0 lb

## 2015-11-21 DIAGNOSIS — R109 Unspecified abdominal pain: Secondary | ICD-10-CM | POA: Diagnosis not present

## 2015-11-21 NOTE — Progress Notes (Signed)
in   Gastroenterology Consultation  Referring Provider:     Steele Sizer, MD Primary Care Physician:  Vonita Moss, MD Primary Gastroenterologist:  Dr. Servando Snare     Reason for Consultation:      Abdominal pain        HPI:   Mike Kline is a 45 y.o. y/o male referred for consultation & management of  Abdominal pain by Dr. Vonita Moss, MD.   This patient comes in today with a report of abdominal pain in the left side of his abdomen. The patient states that the pain is worse when he moves around. He also states it is worse when he mows his lawn.  The patient has multiple joint and muscle pain. There is no report of the pain getting worse with eating or drinking. The patient also denies any change in bowel habits. The patient suffers from adhesive capsulitis. There is no report of any unexplained weight loss.  The patient has had imaging that has not show any cause for his symptoms. The patient also reports that he has bloating with only the left side of his abdomen protruding.  Past Medical History  Diagnosis Date  . Hypertension   . Adhesive capsulitis     permanent disability 07/2010, left shoulder  . Hyperlipidemia   . GERD (gastroesophageal reflux disease)   . Diabetic, retinopathy, proliferative (HCC)   . Blindness of left eye   . Diabetes mellitus without complication [facility])     Past Surgical History  Procedure Laterality Date  . Appendectomy    . Carpal tunnel release Bilateral   . Eye surgery Left   . Cataract surgery Bilateral 2014    Prior to Admission medications   Medication Sig Start Date End Date Taking? Authorizing Provider  atorvastatin (LIPITOR) 40 MG tablet Take 1 tablet (40 mg total) by mouth daily. 10/22/15  Yes Steele Sizer, MD  dapagliflozin propanediol (FARXIGA) 10 MG TABS tablet Take 10 mg by mouth daily. 05/28/15  Yes Steele Sizer, MD  fexofenadine (ALLEGRA) 30 MG tablet Take 30 mg by mouth at bedtime.   Yes Historical Provider, MD  HUMALOG  KWIKPEN 100 UNIT/ML KiwkPen USE THREE TIMES A DAY AS DIRECTED.(UP TO 50 UNITS A DAY OR AS DIRECTED BY SLIDING SCALE) 11/11/15  Yes Steele Sizer, MD  Insulin Glargine (LANTUS SOLOSTAR) 100 UNIT/ML Solostar Pen Inject 60 Units into the skin daily at 10 pm. 05/28/15  Yes Steele Sizer, MD  lansoprazole (PREVACID) 30 MG capsule Take 30 mg by mouth daily at 12 noon.   Yes Historical Provider, MD  metFORMIN (GLUCOPHAGE-XR) 500 MG 24 hr tablet 1,000 mg daily. Reported on 10/22/2015 07/10/15  Yes Historical Provider, MD  ranitidine (ZANTAC) 150 MG tablet Take 150 mg by mouth 2 (two) times daily.   Yes Historical Provider, MD  traMADol (ULTRAM) 50 MG tablet TAKE 2 TABLETS EVERY 6 HOURS AS NEEDED FOR PAIN 11/11/15  Yes Steele Sizer, MD  valsartan (DIOVAN) 320 MG tablet TAKE 1 TABLET DAILY. 10/30/15  Yes Megan Holly Bodily, DO    History reviewed. No pertinent family history.   Social History  Substance Use Topics  . Smoking status: Never Smoker   . Smokeless tobacco: Never Used  . Alcohol Use: No    Allergies as of 11/21/2015  . (No Known Allergies)    Review of Systems:    All systems reviewed and negative except where noted in HPI.   Physical Exam:  BP 139/70  mmHg  Pulse 80  Temp(Src) 98.2 F (36.8 C) (Oral)  Ht 5\' 4"  (1.626 m)  Wt 202 lb (91.627 kg)  BMI 34.66 kg/m2 No LMP for male patient. Psych:  Alert and cooperative. Normal mood and affect. General:   Alert,  Well-developed, well-nourished, pleasant and cooperative in NAD Head:  Normocephalic and atraumatic. Eyes:  Sclera clear, no icterus.   Conjunctiva pink. Ears:  Normal auditory acuity. Nose:  No deformity, discharge, or lesions. Mouth:  No deformity or lesions,oropharynx pink & moist. Neck:  Supple; no masses or thyromegaly. Lungs:  Respirations even and unlabored.  Clear throughout to auscultation.   No wheezes, crackles, or rhonchi. No acute distress. Heart:  Regular rate and rhythm; no murmurs, clicks, rubs, or  gallops. Abdomen:  Normal bowel sounds.  No bruits.  Soft,Tenderness to one finger palpation while flexing the abdominal wall muscles, and non-distended without masses, hepatosplenomegaly or hernias noted.  No guarding or rebound tenderness.  Negative Carnett sign.   Rectal:  Deferred.  Msk:  Multiple scars on his arms. The range of motion of his knees and shoulder are restricted Pulses:  Normal pulses noted. Extremities:  No clubbing or edema.  No cyanosis. Neurologic:  Alert and oriented x3;  grossly normal neurologically. Skin:  Intact without significant lesions or rashes.  No jaundice. Lymph Nodes:  No significant cervical adenopathy. Psych:  Alert and cooperative. Normal mood and affect.  Imaging Studies: No results found.  Assessment and Plan:   Mike Kline is a 45 y.o. y/o male who comes in today with abdominal pain that is consistent with muscular pain. There are no G.I. Components of the pain. It is unlikely to be related to his diabetes since he is not having any nausea or vomiting. The patient has chronic muscular and joint pain. The patient has a positive Carnett Sign. He reports that a started after he was taking up his son extensively to put him to sleep at night.  The patient has been told to use warm compresses to his left abdomen and he should consider wearing a support belt. The patient has been explained the plan and agrees with it.   Note: This dictation was prepared with Dragon dictation along with smaller phrase technology. Any transcriptional errors that result from this process are unintentional.

## 2015-12-02 ENCOUNTER — Other Ambulatory Visit: Payer: Self-pay | Admitting: Family Medicine

## 2015-12-02 NOTE — Telephone Encounter (Signed)
Your patient 

## 2015-12-13 ENCOUNTER — Other Ambulatory Visit: Payer: Self-pay | Admitting: Family Medicine

## 2015-12-16 ENCOUNTER — Telehealth: Payer: Self-pay | Admitting: Family Medicine

## 2015-12-16 ENCOUNTER — Other Ambulatory Visit: Payer: Self-pay | Admitting: Family Medicine

## 2015-12-16 NOTE — Telephone Encounter (Signed)
Pt called stated he needs a refill on Tramadol. Pharm [facility]. Pt has 4 pills left. Thanks.

## 2015-12-17 ENCOUNTER — Other Ambulatory Visit: Payer: Self-pay | Admitting: Family Medicine

## 2015-12-17 ENCOUNTER — Other Ambulatory Visit: Payer: Self-pay

## 2015-12-17 DIAGNOSIS — M7500 Adhesive capsulitis of unspecified shoulder: Secondary | ICD-10-CM | POA: Insufficient documentation

## 2015-12-17 NOTE — Telephone Encounter (Signed)
Pt called and asked that we double check this, pt stated he had last refill was on 11/13/15. Pt upset, stated he is almost out of this medication. Please call pt ASAP. Thanks.

## 2015-12-17 NOTE — Telephone Encounter (Signed)
Pt called and stated he contacted the Pharmacy and they stated it was not too soon for a refill on Tramadol. Pt stated pharmacy stated the problem is its always prescribed with no refills. Thanks.

## 2015-12-17 NOTE — Telephone Encounter (Signed)
Phone call Discussed with patient high-dose tramadol will need to be prescribed by pain clinic discuss lower dose tramadol which patient is interested in Patient will taper tramadol 1 pill a week to get down to 4 a day. Give refill still then

## 2015-12-17 NOTE — Assessment & Plan Note (Signed)
Takes tramadol high-dose

## 2015-12-30 ENCOUNTER — Other Ambulatory Visit: Payer: Self-pay | Admitting: Family Medicine

## 2016-01-03 ENCOUNTER — Other Ambulatory Visit: Payer: Self-pay

## 2016-01-03 DIAGNOSIS — IMO0002 Reserved for concepts with insufficient information to code with codable children: Secondary | ICD-10-CM

## 2016-01-03 DIAGNOSIS — E1065 Type 1 diabetes mellitus with hyperglycemia: Secondary | ICD-10-CM

## 2016-01-03 DIAGNOSIS — E108 Type 1 diabetes mellitus with unspecified complications: Principal | ICD-10-CM

## 2016-01-03 NOTE — Telephone Encounter (Signed)
[facility],  [facility]

## 2016-01-07 MED ORDER — INSULIN GLARGINE 100 UNIT/ML SOLOSTAR PEN: 60.0000 [IU] | PEN_INJECTOR | Freq: Every day | SUBCUTANEOUS | Status: DC

## 2016-02-27 ENCOUNTER — Other Ambulatory Visit: Payer: Self-pay | Admitting: Family Medicine

## 2016-02-28 MED ORDER — TRAMADOL HCL 50 MG PO TABS: 50.0000 mg | ORAL_TABLET | Freq: Three times a day (TID) | ORAL | Status: DC | PRN

## 2016-02-28 NOTE — Telephone Encounter (Signed)
I will fill #[phone] until [facility] is back.

## 2016-03-04 ENCOUNTER — Other Ambulatory Visit: Payer: Self-pay | Admitting: Family Medicine

## 2016-03-04 ENCOUNTER — Telehealth: Payer: Self-pay | Admitting: Family Medicine

## 2016-03-04 MED ORDER — TRAMADOL HCL 50 MG PO TABS: ORAL_TABLET | ORAL | 1 refills | Status: DC

## 2016-03-04 NOTE — Telephone Encounter (Signed)
printed

## 2016-03-04 NOTE — Telephone Encounter (Signed)
Called patient, Rx for Tramadol was taken over to [facility], left VM

## 2016-04-08 ENCOUNTER — Other Ambulatory Visit: Payer: Self-pay | Admitting: Family Medicine

## 2016-04-09 ENCOUNTER — Telehealth: Payer: Self-pay | Admitting: Family Medicine

## 2016-04-09 NOTE — Telephone Encounter (Signed)
Pt came in for a refill on Tramadol. Pt was informed he would need to wait for Dr. Dossie Arbourrissman to return 04/14/16. Patient became very angry stated he would never come back to this office again. Fleet ContrasRachel stated she did not feel comfortable refilling this medication. Stated he would talk to the Print production plannerffice Manager. Thanks.

## 2016-04-14 ENCOUNTER — Other Ambulatory Visit: Payer: Self-pay | Admitting: Family Medicine

## 2016-04-14 ENCOUNTER — Telehealth: Payer: Self-pay | Admitting: Family Medicine

## 2016-04-14 NOTE — Telephone Encounter (Signed)
Pt would like to speak to PCP about traMADol (ULTRAM) 50 MG tablet

## 2016-04-14 NOTE — Telephone Encounter (Signed)
Call pt 

## 2016-04-14 NOTE — Progress Notes (Signed)
Discuss  with patient over use tramadol patient's gone through withdrawal doesn't ever want to take another tramadol again or a controlled drug. Discussed diabetic peripheral neuropathy care and treatment suggested Cymbalta Will observe for now.

## 2016-04-16 ENCOUNTER — Telehealth: Payer: Self-pay

## 2016-04-16 MED ORDER — PRAMIPEXOLE DIHYDROCHLORIDE 0.125 MG PO TABS
0.1250 mg | ORAL_TABLET | Freq: Every evening | ORAL | 3 refills | Status: DC
Start: 2016-04-16 — End: 2016-05-28

## 2016-04-16 NOTE — Telephone Encounter (Signed)
Call pt 

## 2016-04-16 NOTE — Telephone Encounter (Signed)
[facility] patient requesting refill for   Pramipexole 0.125mg  Tab  Rx not in patient's current med list

## 2016-04-16 NOTE — Telephone Encounter (Signed)
Phone call Discussed with patient has used Mirapex in the past for the symptoms and leg symptoms will try again.

## 2016-04-16 NOTE — Telephone Encounter (Signed)
No answer will call back.

## 2016-04-16 NOTE — Addendum Note (Signed)
Addended byVonita Moss: Fatimah Sundquist on: 04/16/2016 05:22 PM   Modules accepted: Orders

## 2016-04-28 ENCOUNTER — Encounter (INDEPENDENT_AMBULATORY_CARE_PROVIDER_SITE_OTHER): Payer: Self-pay

## 2016-05-28 ENCOUNTER — Ambulatory Visit (INDEPENDENT_AMBULATORY_CARE_PROVIDER_SITE_OTHER): Payer: 59 | Admitting: Family Medicine

## 2016-05-28 ENCOUNTER — Encounter: Payer: Self-pay | Admitting: Family Medicine

## 2016-05-28 VITALS — BP 161/83 | HR 85 | Ht 64.0 in | Wt 202.0 lb

## 2016-05-28 DIAGNOSIS — M7502 Adhesive capsulitis of left shoulder: Secondary | ICD-10-CM | POA: Diagnosis not present

## 2016-05-28 DIAGNOSIS — Z0000 Encounter for general adult medical examination without abnormal findings: Secondary | ICD-10-CM

## 2016-05-28 DIAGNOSIS — J019 Acute sinusitis, unspecified: Secondary | ICD-10-CM | POA: Diagnosis not present

## 2016-05-28 DIAGNOSIS — E1051 Type 1 diabetes mellitus with diabetic peripheral angiopathy without gangrene: Secondary | ICD-10-CM

## 2016-05-28 DIAGNOSIS — I10 Essential (primary) hypertension: Secondary | ICD-10-CM | POA: Diagnosis not present

## 2016-05-28 DIAGNOSIS — M7501 Adhesive capsulitis of right shoulder: Secondary | ICD-10-CM

## 2016-05-28 DIAGNOSIS — E7800 Pure hypercholesterolemia, unspecified: Secondary | ICD-10-CM | POA: Diagnosis not present

## 2016-05-28 DIAGNOSIS — G4700 Insomnia, unspecified: Secondary | ICD-10-CM

## 2016-05-28 LAB — URINALYSIS, ROUTINE W REFLEX MICROSCOPIC
BILIRUBIN UA: NEGATIVE
Ketones, UA: NEGATIVE
Leukocytes, UA: NEGATIVE
Nitrite, UA: NEGATIVE
PH UA: 6 (ref 5.0–7.5)
Protein, UA: NEGATIVE
RBC, UA: NEGATIVE
Specific Gravity, UA: <1.005 — ABNORMAL LOW (ref 1.005–1.030)
UUROB: 0.2 mg/dL (ref 0.2–1.0)

## 2016-05-28 LAB — MICROALBUMIN, URINE WAIVED
Creatinine, Urine Waived: 10 mg/dL (ref 10–300)
MICROALB, UR WAIVED: 10 mg/L (ref 0–19)

## 2016-05-28 MED ORDER — ATORVASTATIN CALCIUM 40 MG PO TABS: 40.0000 mg | ORAL_TABLET | Freq: Every day | ORAL | 4 refills | Status: DC

## 2016-05-28 MED ORDER — AMOXICILLIN-POT CLAVULANATE 875-125 MG PO TABS
1.0000 | ORAL_TABLET | Freq: Two times a day (BID) | ORAL | 0 refills | Status: DC
Start: 2016-05-28 — End: 2016-06-19

## 2016-05-28 MED ORDER — PRAMIPEXOLE DIHYDROCHLORIDE 0.125 MG PO TABS: 0.1250 mg | ORAL_TABLET | Freq: Every evening | ORAL | 4 refills | Status: DC

## 2016-05-28 MED ORDER — TRAZODONE HCL 50 MG PO TABS: 25.0000 mg | ORAL_TABLET | Freq: Every evening | ORAL | 3 refills | Status: DC | PRN

## 2016-05-28 MED ORDER — VALSARTAN 320 MG PO TABS: 320.0000 mg | ORAL_TABLET | Freq: Every day | ORAL | 4 refills | Status: DC

## 2016-05-28 NOTE — Assessment & Plan Note (Signed)
Discuss insomnia care and treatment will start with trazodone

## 2016-05-28 NOTE — Progress Notes (Signed)
BP (!) 161/83   Pulse 85   Ht '5\' 4"'  (1.626 m)   Wt 202 lb (91.6 kg)   SpO2 98%   BMI 34.67 kg/m    Subjective:    Patient ID: Mike Kline, male    DOB: [birth year], 45 y.o.   MRN: [id number]  HPI: Mike Kline is a 45 y.o. male  Chief Complaint  Patient presents with  . Annual Exam  . PHQ-9    SCORE:8  Patient pain doing okay taking 2 BC just about every day but gets good pain relief with that. Is able to work out and do [employer] training again which feels some much better. Biggest concern is not sleeping [place] stopped tramadol. Last A1c was 10 done at [facility] clinic endocrinology patient feels it should be much better now as he's done better this was during the problems of withdrawal from tramadol.  Has developed a head cold cough congestion Has been ongoing for 2 months just not going away with facial pressure feeling bad and heavy coughing especially at night. Having a great deal of thick mucus coming out of his head with pressure.  Pressure also elevated takes medications faithfully no issues or concerns chart review blood pressure also elevated endocrinology in August. Other blood pressure readings earlier in the year were normal. Has not been checking blood pressure outside of offices.  Relevant past medical, surgical, family and social history reviewed and updated as indicated. Interim medical history since our last visit reviewed. Allergies and medications reviewed and updated.  Other than patient's usual complaints Review of Systems  Constitutional: Negative.   HENT: Negative.   Eyes: Negative.   Respiratory: Negative.   Cardiovascular: Negative.   Gastrointestinal: Negative.   Endocrine: Negative.   Genitourinary: Negative.   Musculoskeletal: Negative.   Skin: Negative.   Allergic/Immunologic: Negative.   Neurological: Negative.   Hematological: Negative.   Psychiatric/Behavioral: Negative.     Per HPI unless specifically indicated above     Objective:      BP (!) 161/83   Pulse 85   Ht '5\' 4"'  (1.626 m)   Wt 202 lb (91.6 kg)   SpO2 98%   BMI 34.67 kg/m   Wt Readings from Last 3 Encounters:  05/28/16 202 lb (91.6 kg)  11/21/15 202 lb (91.6 kg)  10/22/15 204 lb (92.5 kg)    Physical Exam  Constitutional: He is oriented to person, place, and time. He appears well-developed and well-nourished.  HENT:  Head: Normocephalic and atraumatic.  Right Ear: External ear normal.  Left Ear: External ear normal.  Eyes: Conjunctivae and EOM are normal. Pupils are equal, round, and reactive to light.  Neck: Normal range of motion. Neck supple.  Cardiovascular: Normal rate, regular rhythm, normal heart sounds and intact distal pulses.   Pulmonary/Chest: Effort normal and breath sounds normal.  Abdominal: Soft. Bowel sounds are normal. There is no splenomegaly or hepatomegaly.  Genitourinary: Rectum normal, prostate normal and penis normal.  Musculoskeletal: Normal range of motion.  Neurological: He is alert and oriented to person, place, and time. He has normal reflexes.  Skin: No rash noted. No erythema.  Psychiatric: He has a normal mood and affect. His behavior is normal. Judgment and thought content normal.    Results for orders placed or performed in visit on 10/22/15  LP+ALT+AST Piccolo, [employer]  Result Value Ref Range   ALT (SGPT) Piccolo, Waived 21 10 - 47 U/L   AST (SGOT) Piccolo, Waived 35 11 -  38 U/L   Cholesterol Piccolo, Waived 182 <200 mg/dL   HDL Chol Piccolo, Waived 42 (L) >59 mg/dL   Triglycerides Piccolo,Waived 196 (H) <150 mg/dL   Chol/HDL Ratio Piccolo,Waive 4.3 mg/dL   LDL Chol Calc Piccolo Waived 100 (H) <100 mg/dL   VLDL Chol Calc Piccolo,Waive 39 (H) <30 mg/dL  Bayer DCA Hb A1c Waived  Result Value Ref Range   Bayer DCA Hb A1c Waived 8.3 (H) <0.1 %  Basic metabolic panel  Result Value Ref Range   Glucose 65 65 - 99 mg/dL   BUN 13 6 - 24 mg/dL   Creatinine, Ser 0.95 0.76 - 1.27 mg/dL   GFR calc non Af Amer 96 >59  mL/min/1.73   GFR calc Af Amer 111 >59 mL/min/1.73   BUN/Creatinine Ratio 14 9 - 20   Sodium 140 134 - 144 mmol/L   Potassium 4.5 3.5 - 5.2 mmol/L   Chloride 99 96 - 106 mmol/L   CO2 23 18 - 29 mmol/L   Calcium 9.5 8.7 - 10.2 mg/dL      Assessment & Plan:   Problem List Items Addressed This Visit      Cardiovascular and Mediastinum   Hypertension    Elevated blood pressure patient will do better with lifestyle will treat sinus infection if blood pressure stay and elevated will need to start medications then recheck a month later.      Relevant Medications   valsartan (DIOVAN) 320 MG tablet   atorvastatin (LIPITOR) 40 MG tablet   Diabetes mellitus type 1 with peripheral artery disease (HCC) - Primary    Followed by endo      Relevant Medications   valsartan (DIOVAN) 320 MG tablet   atorvastatin (LIPITOR) 40 MG tablet   Other Relevant Orders   CBC w/Diff   Comp Met (CMET)   Urinalysis, Routine w reflex microscopic   TSH   Lipid panel   PSA   Microalbumin, urine     Musculoskeletal and Integument   Adhesive capsulitis    stable        Other   Hyperlipidemia    The current medical regimen is effective;  continue present plan and medications.       Relevant Medications   valsartan (DIOVAN) 320 MG tablet   atorvastatin (LIPITOR) 40 MG tablet   Insomnia    Discuss insomnia care and treatment will start with trazodone       Other Visit Diagnoses    PE (physical exam), annual       Relevant Orders   CBC w/Diff   Comp Met (CMET)   Urinalysis, Routine w reflex microscopic   TSH   Lipid panel   PSA   Microalbumin, urine   Acute sinusitis, recurrence not specified, unspecified location       Discussed sinusitis care and treatment nasal rinse, Mucinex, use of antibiotics will treat with Augmentin patient education given   Relevant Medications   amoxicillin-clavulanate (AUGMENTIN) 875-125 MG tablet       Follow up plan: Return in about 6 months (around  11/26/2016) for BMP,  Lipids, ALT, AST.

## 2016-05-28 NOTE — Assessment & Plan Note (Signed)
Elevated blood pressure patient will do better with lifestyle will treat sinus infection if blood pressure stay and elevated will need to start medications then recheck a month later.

## 2016-05-28 NOTE — Assessment & Plan Note (Signed)
Followed by endo.  

## 2016-05-28 NOTE — Addendum Note (Signed)
Addended by: Luella CookMCINTYRE, DIRECE E on: 05/28/2016 03:42 PM   Modules accepted: Orders

## 2016-05-28 NOTE — Assessment & Plan Note (Signed)
The current medical regimen is effective;  continue present plan and medications.  

## 2016-05-28 NOTE — Assessment & Plan Note (Signed)
stable °

## 2016-05-29 LAB — COMPREHENSIVE METABOLIC PANEL
A/G RATIO: 1.8 (ref 1.2–2.2)
ALT: 27 IU/L (ref 0–44)
AST: 20 IU/L (ref 0–40)
Albumin: 4.6 g/dL (ref 3.5–5.5)
Alkaline Phosphatase: 110 IU/L (ref 39–117)
BILIRUBIN TOTAL: 0.3 mg/dL (ref 0.0–1.2)
BUN / CREAT RATIO: 15 (ref 9–20)
BUN: 13 mg/dL (ref 6–24)
CHLORIDE: 99 mmol/L (ref 96–106)
CO2: 24 mmol/L (ref 18–29)
Calcium: 9.6 mg/dL (ref 8.7–10.2)
Creatinine, Ser: 0.88 mg/dL (ref 0.76–1.27)
GFR calc non Af Amer: 102 mL/min/{1.73_m2} (ref >59)
GFR, EST AFRICAN AMERICAN: 118 mL/min/{1.73_m2} (ref >59)
Globulin, Total: 2.5 g/dL (ref 1.5–4.5)
Glucose: 163 mg/dL — ABNORMAL HIGH (ref 65–99)
POTASSIUM: 4.3 mmol/L (ref 3.5–5.2)
Sodium: 139 mmol/L (ref 134–144)
TOTAL PROTEIN: 7.1 g/dL (ref 6.0–8.5)

## 2016-05-29 LAB — CBC WITH DIFFERENTIAL/PLATELET
BASOS: 0 %
Basophils Absolute: 0 10*3/uL (ref 0.0–0.2)
EOS (ABSOLUTE): 0.1 10*3/uL (ref 0.0–0.4)
Eos: 1 %
Hematocrit: 41.5 % (ref 37.5–51.0)
Hemoglobin: 14 g/dL (ref 12.6–17.7)
IMMATURE GRANS (ABS): 0 10*3/uL (ref 0.0–0.1)
Immature Granulocytes: 0 %
LYMPHS: 16 %
Lymphocytes Absolute: 1.5 10*3/uL (ref 0.7–3.1)
MCH: 27.7 pg (ref 26.6–33.0)
MCHC: 33.7 g/dL (ref 31.5–35.7)
MCV: 82 fL (ref 79–97)
MONOS ABS: 0.5 10*3/uL (ref 0.1–0.9)
Monocytes: 6 %
Neutrophils Absolute: 6.8 10*3/uL (ref 1.4–7.0)
Neutrophils: 77 %
PLATELETS: 279 10*3/uL (ref 150–379)
RBC: 5.06 x10E6/uL (ref 4.14–5.80)
RDW: 13.6 % (ref 12.3–15.4)
WBC: 9 10*3/uL (ref 3.4–10.8)

## 2016-05-29 LAB — LIPID PANEL
CHOL/HDL RATIO: 2.7 ratio (ref 0.0–5.0)
Cholesterol, Total: 120 mg/dL (ref 100–199)
HDL: 44 mg/dL (ref >39)
LDL Calculated: 62 mg/dL (ref 0–99)
TRIGLYCERIDES: 71 mg/dL (ref 0–149)
VLDL Cholesterol Cal: 14 mg/dL (ref 5–40)

## 2016-05-29 LAB — TSH: TSH: 1.72 u[IU]/mL (ref 0.450–4.500)

## 2016-05-29 LAB — PSA: PROSTATE SPECIFIC AG, SERUM: 0.3 ng/mL (ref 0.0–4.0)

## 2016-06-01 ENCOUNTER — Encounter: Payer: Self-pay | Admitting: Family Medicine

## 2016-06-16 ENCOUNTER — Telehealth: Payer: Self-pay | Admitting: Family Medicine

## 2016-06-16 MED ORDER — AZITHROMYCIN 250 MG PO TABS: ORAL_TABLET | ORAL | 0 refills | Status: DC

## 2016-06-16 NOTE — Telephone Encounter (Signed)
Routing to provider  

## 2016-06-16 NOTE — Telephone Encounter (Signed)
rx sent

## 2016-06-16 NOTE — Telephone Encounter (Signed)
Pt called and stated that he still has a cough from his appt 05/28/16. He has completed the amoxicillin-clavulanate (AUGMENTIN) 875-125 MG tablet but the cough won't go away. [facility]

## 2016-06-19 ENCOUNTER — Ambulatory Visit (INDEPENDENT_AMBULATORY_CARE_PROVIDER_SITE_OTHER): Payer: 59 | Admitting: Family Medicine

## 2016-06-19 ENCOUNTER — Ambulatory Visit
Admission: RE | Admit: 2016-06-19 | Discharge: 2016-06-19 | Disposition: A | Discharge status: Home / Self Care | Payer: 59 | Source: Ambulatory Visit | Attending: Family Medicine | Admitting: Family Medicine

## 2016-06-19 ENCOUNTER — Telehealth: Payer: Self-pay | Admitting: Family Medicine

## 2016-06-19 ENCOUNTER — Encounter: Payer: Self-pay | Admitting: Family Medicine

## 2016-06-19 VITALS — BP 166/74 | HR 86 | Temp 98.2°F | Wt 202.8 lb

## 2016-06-19 DIAGNOSIS — R05 Cough: Secondary | ICD-10-CM

## 2016-06-19 DIAGNOSIS — I517 Cardiomegaly: Secondary | ICD-10-CM | POA: Insufficient documentation

## 2016-06-19 DIAGNOSIS — R059 Cough, unspecified: Secondary | ICD-10-CM

## 2016-06-19 DIAGNOSIS — R918 Other nonspecific abnormal finding of lung field: Secondary | ICD-10-CM | POA: Diagnosis not present

## 2016-06-19 MED ORDER — PREDNISONE 10 MG PO TABS: ORAL_TABLET | ORAL | 0 refills | Status: DC

## 2016-06-19 MED ORDER — BENZONATATE 100 MG PO CAPS: 100.0000 mg | ORAL_CAPSULE | Freq: Two times a day (BID) | ORAL | 0 refills | Status: DC | PRN

## 2016-06-19 MED ORDER — ALBUTEROL SULFATE (2.5 MG/3ML) 0.083% IN NEBU: 2.5000 mg | INHALATION_SOLUTION | Freq: Once | RESPIRATORY_TRACT | Status: DC

## 2016-06-19 NOTE — Telephone Encounter (Signed)
Patient notified

## 2016-06-19 NOTE — Progress Notes (Signed)
BP (!) 166/74 (BP Location: Left Arm, Patient Position: Sitting, Cuff Size: Large)   Pulse 86   Temp 98.2 F (36.8 C)   Wt 202 lb 12.8 oz (92 kg)   SpO2 99%   BMI 34.81 kg/m    Subjective:    Patient ID: Mike Kline, male    DOB: 03/03/1971, 45 y.o.   MRN: [id number]  HPI: Mike Kline is a 45 y.o. male  Chief Complaint  Patient presents with  . Cough   UPPER RESPIRATORY TRACT INFECTION Duration: 3 months Worst symptom: cough Fever: no Cough: yes Shortness of breath: yes Wheezing: yes Chest pain: yes, with cough Chest tightness: yes Chest congestion: no Nasal congestion: yes Runny nose: no Post nasal drip: no Sneezing: yes Sore throat: yes Swollen glands: no Sinus pressure: yes Headache: yes Face pain: yes Toothache: yes Ear pain: no  Ear pressure: no  Eyes red/itching:no Eye drainage/crusting: no  Vomiting: no Rash: no Fatigue: yes Sick contacts: yes Strep contacts: no  Context: worse Recurrent sinusitis: no Relief with OTC cold/cough medications: no  Treatments attempted: cold/sinus, mucinex, anti-histamine, pseudoephedrine, cough syrup and antibiotics   Relevant past medical, surgical, family and social history reviewed and updated as indicated. Interim medical history since our last visit reviewed. Allergies and medications reviewed and updated.  Review of Systems  Constitutional: Negative.   HENT: Positive for congestion, postnasal drip, rhinorrhea, sinus pressure and sneezing. Negative for dental problem, drooling, ear discharge, ear pain, facial swelling, hearing loss, mouth sores, nosebleeds, sinus pain, sore throat, tinnitus, trouble swallowing and voice change.   Respiratory: Positive for cough, shortness of breath and wheezing. Negative for apnea, choking, chest tightness and stridor.   Cardiovascular: Negative.   Psychiatric/Behavioral: Negative.     Per HPI unless specifically indicated above     Objective:    BP (!) 166/74 (BP  Location: Left Arm, Patient Position: Sitting, Cuff Size: Large)   Pulse 86   Temp 98.2 F (36.8 C)   Wt 202 lb 12.8 oz (92 kg)   SpO2 99%   BMI 34.81 kg/m   Wt Readings from Last 3 Encounters:  06/19/16 202 lb 12.8 oz (92 kg)  05/28/16 202 lb (91.6 kg)  11/21/15 202 lb (91.6 kg)    Physical Exam  Constitutional: He is oriented to person, place, and time. He appears well-developed and well-nourished. No distress.  HENT:  Head: Normocephalic and atraumatic.  Right Ear: Hearing and external ear normal.  Left Ear: Hearing and external ear normal.  Nose: Nose normal.  Mouth/Throat: Oropharynx is clear and moist. No oropharyngeal exudate.  Eyes: Conjunctivae, EOM and lids are normal. Pupils are equal, round, and reactive to light. Right eye exhibits no discharge. Left eye exhibits no discharge. No scleral icterus.  Neck: Normal range of motion. Neck supple. No JVD present. No tracheal deviation present. No thyromegaly present.  Cardiovascular: Normal rate, regular rhythm, normal heart sounds and intact distal pulses.  Exam reveals no gallop and no friction rub.   No murmur heard. Pulmonary/Chest: Effort normal and breath sounds normal. No stridor. No respiratory distress. He has no wheezes. He has no rales. He exhibits no tenderness.  Barking cough  Musculoskeletal: Normal range of motion.  Lymphadenopathy:    He has no cervical adenopathy.  Neurological: He is alert and oriented to person, place, and time.  Skin: Skin is warm, dry and intact. No rash noted. He is not diaphoretic. No erythema. No pallor.  Psychiatric: He has a  normal mood and affect. His speech is normal and behavior is normal. Judgment and thought content normal. Cognition and memory are normal.  Nursing note and vitals reviewed.   Results for orders placed or performed in visit on 05/28/16  CBC w/Diff  Result Value Ref Range   WBC 9.0 3.4 - 10.8 x10E3/uL   RBC 5.06 4.14 - 5.80 x10E6/uL   Hemoglobin 14.0 12.6 -  17.7 g/dL   Hematocrit 41.5 37.5 - 51.0 %   MCV 82 79 - 97 fL   MCH 27.7 26.6 - 33.0 pg   MCHC 33.7 31.5 - 35.7 g/dL   RDW 13.6 12.3 - 15.4 %   Platelets 279 150 - 379 x10E3/uL   Neutrophils 77 Not Estab. %   Lymphs 16 Not Estab. %   Monocytes 6 Not Estab. %   Eos 1 Not Estab. %   Basos 0 Not Estab. %   Neutrophils Absolute 6.8 1.4 - 7.0 x10E3/uL   Lymphocytes Absolute 1.5 0.7 - 3.1 x10E3/uL   Monocytes Absolute 0.5 0.1 - 0.9 x10E3/uL   EOS (ABSOLUTE) 0.1 0.0 - 0.4 x10E3/uL   Basophils Absolute 0.0 0.0 - 0.2 x10E3/uL   Immature Granulocytes 0 Not Estab. %   Immature Grans (Abs) 0.0 0.0 - 0.1 x10E3/uL  Comp Met (CMET)  Result Value Ref Range   Glucose 163 (H) 65 - 99 mg/dL   BUN 13 6 - 24 mg/dL   Creatinine, Ser 0.88 0.76 - 1.27 mg/dL   GFR calc non Af Amer 102 >59 mL/min/1.73   GFR calc Af Amer 118 >59 mL/min/1.73   BUN/Creatinine Ratio 15 9 - 20   Sodium 139 134 - 144 mmol/L   Potassium 4.3 3.5 - 5.2 mmol/L   Chloride 99 96 - 106 mmol/L   CO2 24 18 - 29 mmol/L   Calcium 9.6 8.7 - 10.2 mg/dL   Total Protein 7.1 6.0 - 8.5 g/dL   Albumin 4.6 3.5 - 5.5 g/dL   Globulin, Total 2.5 1.5 - 4.5 g/dL   Albumin/Globulin Ratio 1.8 1.2 - 2.2   Bilirubin Total 0.3 0.0 - 1.2 mg/dL   Alkaline Phosphatase 110 39 - 117 IU/L   AST 20 0 - 40 IU/L   ALT 27 0 - 44 IU/L  Urinalysis, Routine w reflex microscopic  Result Value Ref Range   Specific Gravity, UA <1.005 (L) 1.005 - 1.030   pH, UA 6.0 5.0 - 7.5   Color, UA Yellow Yellow   Appearance Ur Clear Clear   Leukocytes, UA Negative Negative   Protein, UA Negative Negative/Trace   Glucose, UA 1+ (A) Negative   Ketones, UA Negative Negative   RBC, UA Negative Negative   Bilirubin, UA Negative Negative   Urobilinogen, Ur 0.2 0.2 - 1.0 mg/dL   Nitrite, UA Negative Negative  TSH  Result Value Ref Range   TSH 1.720 0.450 - 4.500 uIU/mL  Lipid panel  Result Value Ref Range   Cholesterol, Total 120 100 - 199 mg/dL   Triglycerides 71 0 -  149 mg/dL   HDL 44 >39 mg/dL   VLDL Cholesterol Cal 14 5 - 40 mg/dL   LDL Calculated 62 0 - 99 mg/dL   Chol/HDL Ratio 2.7 0.0 - 5.0 ratio units  PSA  Result Value Ref Range   Prostate Specific Ag, Serum 0.3 0.0 - 4.0 ng/mL  Microalbumin, Urine Waived  Result Value Ref Range   Microalb, Ur Waived 10 0 - 19 mg/L   Creatinine, Urine Waived  10 10 - 300 mg/dL   Microalb/Creat Ratio 30-300 (H) <30 mg/g      Assessment & Plan:   Problem List Items Addressed This Visit    None    Visit Diagnoses    Cough    -  Primary   No better with abx, lungs clear, no sign of bacterial infection. Slightly better after neb. Will start him on prednisone and tessalon perles. Recheck 2 weeks.   Relevant Medications   albuterol (PROVENTIL) (2.5 MG/3ML) 0.083% nebulizer solution 2.5 mg   Other Relevant Orders   DG Chest 2 View       Follow up plan: Return in about 2 weeks (around 07/03/2016) for Lung recheck and BP check with spiro.

## 2016-06-19 NOTE — Telephone Encounter (Signed)
Please let him know that his x-ray was normal. Thanks!

## 2016-06-23 ENCOUNTER — Telehealth: Payer: Self-pay | Admitting: Family Medicine

## 2016-06-23 ENCOUNTER — Emergency Department: Payer: 59

## 2016-06-23 ENCOUNTER — Emergency Department
Admission: EM | Admit: 2016-06-23 | Discharge: 2016-06-23 | Disposition: A | Discharge status: Home / Self Care | Payer: 59 | Attending: Emergency Medicine | Admitting: Emergency Medicine

## 2016-06-23 DIAGNOSIS — J40 Bronchitis, not specified as acute or chronic: Secondary | ICD-10-CM | POA: Diagnosis not present

## 2016-06-23 DIAGNOSIS — Z79899 Other long term (current) drug therapy: Secondary | ICD-10-CM | POA: Diagnosis not present

## 2016-06-23 DIAGNOSIS — I10 Essential (primary) hypertension: Secondary | ICD-10-CM | POA: Diagnosis not present

## 2016-06-23 DIAGNOSIS — E109 Type 1 diabetes mellitus without complications: Secondary | ICD-10-CM | POA: Insufficient documentation

## 2016-06-23 DIAGNOSIS — R05 Cough: Secondary | ICD-10-CM | POA: Diagnosis present

## 2016-06-23 DIAGNOSIS — Z7984 Long term (current) use of oral hypoglycemic drugs: Secondary | ICD-10-CM | POA: Diagnosis not present

## 2016-06-23 LAB — BASIC METABOLIC PANEL
ANION GAP: 10 (ref 5–15)
BUN: 12 mg/dL (ref 6–20)
CALCIUM: 8.8 mg/dL — AB (ref 8.9–10.3)
CO2: 24 mmol/L (ref 22–32)
Chloride: 101 mmol/L (ref 101–111)
Creatinine, Ser: 1.01 mg/dL (ref 0.61–1.24)
GFR calc Af Amer: >60 mL/min (ref >60)
GLUCOSE: 184 mg/dL — AB (ref 65–99)
Potassium: 3.5 mmol/L (ref 3.5–5.1)
Sodium: 135 mmol/L (ref 135–145)

## 2016-06-23 LAB — CBC
HEMATOCRIT: 42.1 % (ref 40.0–52.0)
HEMOGLOBIN: 14.2 g/dL (ref 13.0–18.0)
MCH: 27.9 pg (ref 26.0–34.0)
MCHC: 33.8 g/dL (ref 32.0–36.0)
MCV: 82.4 fL (ref 80.0–100.0)
Platelets: 265 10*3/uL (ref 150–440)
RBC: 5.11 MIL/uL (ref 4.40–5.90)
RDW: 13.2 % (ref 11.5–14.5)
WBC: 12.8 10*3/uL — ABNORMAL HIGH (ref 3.8–10.6)

## 2016-06-23 MED ORDER — IPRATROPIUM-ALBUTEROL 0.5-2.5 (3) MG/3ML IN SOLN
3.0000 mL | Freq: Once | RESPIRATORY_TRACT | Status: AC
  Administered 2016-06-23: 3 mL via RESPIRATORY_TRACT

## 2016-06-23 MED ORDER — HYDROCOD POLST-CPM POLST ER 10-8 MG/5ML PO SUER
ORAL | Status: AC
  Filled 2016-06-23: qty 5

## 2016-06-23 MED ORDER — HYDROCOD POLST-CPM POLST ER 10-8 MG/5ML PO SUER
5.0000 mL | Freq: Once | ORAL | Status: AC
  Administered 2016-06-23: 5 mL via ORAL

## 2016-06-23 MED ORDER — KETOROLAC TROMETHAMINE 60 MG/2ML IM SOLN
15.0000 mg | Freq: Once | INTRAMUSCULAR | Status: AC
  Administered 2016-06-23: 15 mg via INTRAMUSCULAR

## 2016-06-23 MED ORDER — ALBUTEROL SULFATE (2.5 MG/3ML) 0.083% IN NEBU
5.0000 mg | INHALATION_SOLUTION | Freq: Once | RESPIRATORY_TRACT | Status: AC
  Administered 2016-06-23: 5 mg via RESPIRATORY_TRACT

## 2016-06-23 MED ORDER — ALBUTEROL SULFATE (2.5 MG/3ML) 0.083% IN NEBU
INHALATION_SOLUTION | RESPIRATORY_TRACT | Status: AC
  Filled 2016-06-23: qty 6

## 2016-06-23 MED ORDER — KETOROLAC TROMETHAMINE 30 MG/ML IJ SOLN
INTRAMUSCULAR | Status: AC
  Filled 2016-06-23: qty 1

## 2016-06-23 MED ORDER — HYDROCOD POLST-CPM POLST ER 10-8 MG/5ML PO SUER: 5.0000 mL | Freq: Four times a day (QID) | ORAL | 0 refills | Status: DC | PRN

## 2016-06-23 MED ORDER — ALBUTEROL SULFATE HFA 108 (90 BASE) MCG/ACT IN AERS: 2.0000 | INHALATION_SPRAY | RESPIRATORY_TRACT | 0 refills | Status: DC | PRN

## 2016-06-23 MED ORDER — IPRATROPIUM-ALBUTEROL 0.5-2.5 (3) MG/3ML IN SOLN
RESPIRATORY_TRACT | Status: AC
  Filled 2016-06-23: qty 3

## 2016-06-23 MED ORDER — DOXYCYCLINE HYCLATE 100 MG PO CAPS: 100.0000 mg | ORAL_CAPSULE | Freq: Two times a day (BID) | ORAL | 0 refills | Status: DC

## 2016-06-23 NOTE — ED Notes (Signed)
Pt reports he has had non productive cough since Saturday.

## 2016-06-23 NOTE — Telephone Encounter (Signed)
Patient had x-ray done 4 days ago which did not show pneumonia. If he is getting worse, please have him come in to see someone to see if he needs another antibiotic.

## 2016-06-23 NOTE — ED Provider Notes (Signed)
[facility] Emergency Department Provider Note  ____________________________________________  Time seen: Approximately 5:46 PM  I have reviewed the triage vital signs and the nursing notes.   HISTORY  Chief Complaint Cough and Shortness of Breath    HPI Mike Kline is a 45 y.o. male who complains of frequent nonproductive coughing fits over the past week. Prior to that he had had a URI that seemed viral with sore throat and rhinorrhea. Afterward he developed forceful rapid coughing. He completed a course of Augmentin 2 weeks ago from his primary care doctor for sinusitis. He then was given a course of azithromycin which he finished 3 days ago. Denies fevers or chills. No chest pain except for some discomfort in the left lower chest when he coughs very hard. Feels little bit wheezy and mildly short of breath. Has a history of diabetes, takes his medications. Has an appointment with his primary care doctor tomorrow. No recent travel trauma hospitalization or surgery. No history of DVT or PE    Past Medical History:  Diagnosis Date  . Adhesive capsulitis    permanent disability 07/2010, left shoulder  . Blindness of left eye   . Diabetes mellitus without complication (HCC)   . Diabetic, retinopathy, proliferative (HCC)   . GERD (gastroesophageal reflux disease)   . Hyperlipidemia   . Hypertension      Patient Active Problem List   Diagnosis Date Noted  . Insomnia 05/28/2016  . Adhesive capsulitis 12/17/2015  . Hypercholesterolemia 10/22/2015  . Abdominal pain 07/23/2015  . Diabetic, retinopathy, proliferative (HCC) 05/28/2015  . Diabetes mellitus type 1 with peripheral artery disease (HCC) 05/28/2015  . Hypertension   . Type I diabetes mellitus with manifestations, uncontrolled (HCC)   . Hyperlipidemia   . GERD (gastroesophageal reflux disease)   . Nuclear sclerotic cataract 07/05/2013  . Type 1 diabetes mellitus (HCC) 05/29/2013     Past  Surgical History:  Procedure Laterality Date  . APPENDECTOMY    . CARPAL TUNNEL RELEASE Bilateral   . cataract surgery Bilateral 2014  . EYE SURGERY Left      Prior to Admission medications   Medication Sig Start Date End Date Taking? Authorizing Provider  atorvastatin (LIPITOR) 40 MG tablet Take 1 tablet (40 mg total) by mouth daily. 05/28/16   Steele SizerMark A Crissman, MD  azithromycin (ZITHROMAX) 250 MG tablet 2 now then 1 a day 06/16/16   Steele SizerMark A Crissman, MD  benzonatate (TESSALON) 100 MG capsule Take 1 capsule (100 mg total) by mouth 2 (two) times daily as needed for cough. 06/19/16   Megan P Johnson, DO  chlorpheniramine-HYDROcodone (TUSSIONEX PENNKINETIC ER) 10-8 MG/5ML SUER Take 5 mLs by mouth every 6 (six) hours as needed for cough. 06/23/16   Sharman CheekPhillip Rahsaan Weakland, MD  doxycycline (VIBRAMYCIN) 100 MG capsule Take 1 capsule (100 mg total) by mouth 2 (two) times daily. 06/23/16   Sharman CheekPhillip Caralynn Gelber, MD  fexofenadine (ALLEGRA) 30 MG tablet Take 30 mg by mouth at bedtime.    Historical Provider, MD  HUMALOG KWIKPEN 100 UNIT/ML KiwkPen USE THREE TIMES A DAY AS DIRECTED.(UP TO 50 UNITS A DAY OR AS DIRECTED BY SLIDING SCALE) 12/30/15   Steele SizerMark A Crissman, MD  Insulin Glargine (LANTUS SOLOSTAR) 100 UNIT/ML Solostar Pen Inject 60 Units into the skin daily at 10 pm. 01/07/16   Steele SizerMark A Crissman, MD  lansoprazole (PREVACID) 30 MG capsule Take 30 mg by mouth daily at 12 noon.    Historical Provider, MD  metFORMIN (GLUCOPHAGE-XR) 500 MG  24 hr tablet 1,000 mg daily. Reported on 10/22/2015 07/10/15   Historical Provider, MD  pramipexole (MIRAPEX) 0.125 MG tablet Take 1 tablet (0.125 mg total) by mouth every evening. 05/28/16   Steele Sizer, MD  predniSONE (DELTASONE) 10 MG tablet 6 tabs today and tomorrow, 5 tabs for next 2 days, decrease by 1 every other day until gone. 06/19/16   Megan P Johnson, DO  ranitidine (ZANTAC) 150 MG tablet Take 150 mg by mouth 2 (two) times daily.    Historical Provider, MD  traZODone  (DESYREL) 50 MG tablet Take 0.5-1 tablets (25-50 mg total) by mouth at bedtime as needed for sleep. 05/28/16   Steele Sizer, MD  valsartan (DIOVAN) 320 MG tablet Take 1 tablet (320 mg total) by mouth daily. 05/28/16   Steele Sizer, MD     Allergies Tramadol   No family history on file.  Social History Social History  Substance Use Topics  . Smoking status: Never Smoker  . Smokeless tobacco: Never Used  . Alcohol use No    Review of Systems  Constitutional:   No fever or chills.  ENT:   Positive sore throat. Positive rhinorrhea. Cardiovascular:   No chest pain. Respiratory:   Positive mild shortness breath and nonproductive cough. Gastrointestinal:   Negative for abdominal pain, vomiting and diarrhea.   Musculoskeletal:   Negative for focal pain or swelling 10-point ROS otherwise negative.  ____________________________________________   PHYSICAL EXAM:  VITAL SIGNS: ED Triage Vitals  Enc Vitals Group     BP 06/23/16 1259 (!) 159/91     Pulse Rate 06/23/16 1257 85     Resp 06/23/16 1257 (!) 28     Temp --      Temp src --      SpO2 06/23/16 1257 100 %     Weight 06/23/16 1257 200 lb (90.7 kg)     Height 06/23/16 1257 5\' 7"  (1.702 m)     Head Circumference --      Peak Flow --      Pain Score 06/23/16 1258 8     Pain Loc --      Pain Edu? --      Excl. in GC? --     Vital signs reviewed, nursing assessments reviewed.   Constitutional:   Alert and oriented.Uncomfortable but not in distress. Eyes:   No scleral icterus. No conjunctival pallor. PERRL. EOMI.  No nystagmus. ENT   Head:   Normocephalic and atraumatic.   Nose:   No congestion/rhinnorhea. No septal hematoma   Mouth/Throat:   MMM, mild pharyngeal erythema. No peritonsillar mass.    Neck:   No stridor. No SubQ emphysema. No meningismus. Hematological/Lymphatic/Immunilogical:   No cervical lymphadenopathy. Cardiovascular:   RRR. Symmetric bilateral radial and DP pulses.  No murmurs.   Respiratory:   Normal respiratory effort without tachypnea nor retractions. Diffuse expiratory wheezing. Coarse breath sounds diffusely. Normal expiratory phase.  Gastrointestinal:   Soft and nontender. Non distended. There is no CVA tenderness.  No rebound, rigidity, or guarding. Genitourinary:   deferred Musculoskeletal:   Nontender with normal range of motion in all extremities. No joint effusions.  No lower extremity tenderness.  No edema. Neurologic:   Normal speech and language.  CN 2-10 normal. Motor grossly intact. No gross focal neurologic deficits are appreciated.  Skin:    Skin is warm, dry and intact. No rash noted.  No petechiae, purpura, or bullae.  ____________________________________________    LABS (pertinent positives/negatives) (all  labs ordered are listed, but only abnormal results are displayed) Labs Reviewed  BASIC METABOLIC PANEL - Abnormal; Notable for the following:       Result Value   Glucose, Bld 184 (*)    Calcium 8.8 (*)    All other components within normal limits  CBC - Abnormal; Notable for the following:    WBC 12.8 (*)    All other components within normal limits   ____________________________________________   EKG    ____________________________________________    RADIOLOGY  Chest x-ray unremarkable  ____________________________________________   PROCEDURES Procedures  ____________________________________________   INITIAL IMPRESSION / ASSESSMENT AND PLAN / ED COURSE  Pertinent labs & imaging results that were available during my care of the patient were reviewed by me and considered in my medical decision making (see chart for details).  Patient well appearing no acute distress, presents with acute bronchitis. The pattern of his symptoms are concerning for pertussis, but the patient is artery and is to course of azithromycin and so likely this is artery been treated and he is not infectious is still having residual airway  inflammation. He is on a course of prednisone from his primary care doctor starting at 60 mg daily she is currently down to 40 mg daily. I advised him to continue this and continue monitoring his blood sugars and corrected with with his insulins as advised by his doctor.  No evidence of pneumothorax, acute pneumonia, sepsis, ACS PE dissection pericarditis. I'll give doxycycline for further atypical agent coverage given his diabetes and ongoing symptoms. He also may gain and anti-inflammatory effect from this as well. Albuterol, Tussionex.     Clinical Course    ____________________________________________   FINAL CLINICAL IMPRESSION(S) / ED DIAGNOSES  Final diagnoses:  Bronchitis       Portions of this note were generated with dragon dictation software. Dictation errors may occur despite best attempts at proofreading.    Sharman CheekPhillip Kody Vigil, MD 06/23/16 [phone]

## 2016-06-23 NOTE — Telephone Encounter (Signed)
Dr. Laural BenesJohnson, you saw patient last week for this.

## 2016-06-23 NOTE — ED Triage Notes (Signed)
Pt has had a cough for the past couple of weeks and has had a course of abx without relief, states since Saturday the cough has worsened with wheezing.. Pt has a constant barking cough upon arrival..

## 2016-06-23 NOTE — Telephone Encounter (Signed)
Patient is scheduled for tomorrow, they will try otc cough medication

## 2016-06-23 NOTE — Telephone Encounter (Signed)
Pt's wife called and is concerned the pt may have pneumonia. Would like to know if something can be ordered to have his lungs checked for fluid or pneumonia. Wife stated pt is wheezing really badly. Please call pt's wife to advise. Thanks.

## 2016-06-24 ENCOUNTER — Ambulatory Visit: Payer: 59 | Admitting: Family Medicine

## 2016-11-26 ENCOUNTER — Ambulatory Visit: Payer: 59 | Admitting: Family Medicine

## 2016-12-15 ENCOUNTER — Ambulatory Visit (INDEPENDENT_AMBULATORY_CARE_PROVIDER_SITE_OTHER): Payer: 59 | Admitting: Family Medicine

## 2016-12-15 ENCOUNTER — Encounter: Payer: Self-pay | Admitting: Family Medicine

## 2016-12-15 VITALS — BP 136/76 | HR 96 | Wt 202.8 lb

## 2016-12-15 DIAGNOSIS — E7800 Pure hypercholesterolemia, unspecified: Secondary | ICD-10-CM | POA: Diagnosis not present

## 2016-12-15 DIAGNOSIS — E1051 Type 1 diabetes mellitus with diabetic peripheral angiopathy without gangrene: Secondary | ICD-10-CM | POA: Diagnosis not present

## 2016-12-15 DIAGNOSIS — E1065 Type 1 diabetes mellitus with hyperglycemia: Secondary | ICD-10-CM | POA: Diagnosis not present

## 2016-12-15 DIAGNOSIS — K219 Gastro-esophageal reflux disease without esophagitis: Secondary | ICD-10-CM

## 2016-12-15 DIAGNOSIS — IMO0002 Reserved for concepts with insufficient information to code with codable children: Secondary | ICD-10-CM

## 2016-12-15 DIAGNOSIS — I10 Essential (primary) hypertension: Secondary | ICD-10-CM

## 2016-12-15 DIAGNOSIS — E108 Type 1 diabetes mellitus with unspecified complications: Secondary | ICD-10-CM

## 2016-12-15 NOTE — Progress Notes (Signed)
BP 136/76 (BP Location: Left Arm)   Pulse 96   Wt 202 lb 12.8 oz (92 kg)   SpO2 99%   BMI 31.76 kg/m    Subjective:    Patient ID: Mike Kline, male    DOB: [birth year], 46 y.o.   MRN: [id number]  HPI: Mike Kline is a 46 y.o. male  Chief Complaint  Patient presents with  . Follow-up  . Hypertension  . Hyperlipidemia   Patient recheck all in all doing well except for diabetes with A1c still remained elevated struggling to maintain diabetes in control but doing home glucose monitoring which helps a great deal. Blood pressure endocrinology added hydrochlorothiazide and getting better control of blood pressure. Taking medications without problems area Cholesterol also doing well. Patient embarking on an exercise program and better nutrition which should help all of the above. Relevant past medical, surgical, family and social history reviewed and updated as indicated. Interim medical history since our last visit reviewed. Allergies and medications reviewed and updated.  Review of Systems  Constitutional: Negative.   Respiratory: Negative.   Cardiovascular: Negative.     Per HPI unless specifically indicated above     Objective:    BP 136/76 (BP Location: Left Arm)   Pulse 96   Wt 202 lb 12.8 oz (92 kg)   SpO2 99%   BMI 31.76 kg/m   Wt Readings from Last 3 Encounters:  12/15/16 202 lb 12.8 oz (92 kg)  06/23/16 200 lb (90.7 kg)  06/19/16 202 lb 12.8 oz (92 kg)    Physical Exam  Constitutional: He is oriented to person, place, and time. He appears well-developed and well-nourished.  HENT:  Head: Normocephalic and atraumatic.  Eyes: Conjunctivae and EOM are normal.  Neck: Normal range of motion.  Cardiovascular: Normal rate, regular rhythm and normal heart sounds.   Pulmonary/Chest: Effort normal and breath sounds normal.  Musculoskeletal: Normal range of motion.  Neurological: He is alert and oriented to person, place, and time.  Skin: No erythema.    Psychiatric: He has a normal mood and affect. His behavior is normal. Judgment and thought content normal.    Results for orders placed or performed during the hospital encounter of 06/23/16  Basic metabolic panel  Result Value Ref Range   Sodium 135 135 - 145 mmol/L   Potassium 3.5 3.5 - 5.1 mmol/L   Chloride 101 101 - 111 mmol/L   CO2 24 22 - 32 mmol/L   Glucose, Bld 184 (H) 65 - 99 mg/dL   BUN 12 6 - 20 mg/dL   Creatinine, Ser 4.09 0.61 - 1.24 mg/dL   Calcium 8.8 (L) 8.9 - 10.3 mg/dL   GFR calc non Af Amer >60 >60 mL/min   GFR calc Af Amer >60 >60 mL/min   Anion gap 10 5 - 15  CBC  Result Value Ref Range   WBC 12.8 (H) 3.8 - 10.6 K/uL   RBC 5.11 4.40 - 5.90 MIL/uL   Hemoglobin 14.2 13.0 - 18.0 g/dL   HCT 81.1 91.4 - 78.2 %   MCV 82.4 80.0 - 100.0 fL   MCH 27.9 26.0 - 34.0 pg   MCHC 33.8 32.0 - 36.0 g/dL   RDW 95.6 21.3 - 08.6 %   Platelets 265 150 - 440 K/uL      Assessment & Plan:   Problem List Items Addressed This Visit      Cardiovascular and Mediastinum   Hypertension    The current medical  regimen is effective;  continue present plan and medications.       Relevant Medications   valsartan-hydrochlorothiazide (DIOVAN-HCT) 320-12.5 MG tablet   Other Relevant Orders   Basic metabolic panel   LP+ALT+AST Piccolo, Waived   Diabetes mellitus type 1 with peripheral artery disease (HCC)    Followed by endocrinology      Relevant Medications   valsartan-hydrochlorothiazide (DIOVAN-HCT) 320-12.5 MG tablet     Digestive   GERD (gastroesophageal reflux disease)    The current medical regimen is effective;  continue present plan and medications.         Endocrine   Type I diabetes mellitus with manifestations, uncontrolled (HCC)   Relevant Medications   valsartan-hydrochlorothiazide (DIOVAN-HCT) 320-12.5 MG tablet   Other Relevant Orders   Basic metabolic panel   LP+ALT+AST Piccolo, Waived     Other   Hypercholesterolemia - Primary    The current  medical regimen is effective;  continue present plan and medications.       Relevant Medications   valsartan-hydrochlorothiazide (DIOVAN-HCT) 320-12.5 MG tablet   Other Relevant Orders   Basic metabolic panel   LP+ALT+AST Piccolo, Waived   RESOLVED: Hyperlipidemia   Relevant Medications   valsartan-hydrochlorothiazide (DIOVAN-HCT) 320-12.5 MG tablet   Other Relevant Orders   Basic metabolic panel   LP+ALT+AST Piccolo, Waived       Follow up plan: Return in about 6 months (around 06/17/2017) for Physical Exam.

## 2016-12-15 NOTE — Assessment & Plan Note (Signed)
The current medical regimen is effective;  continue present plan and medications.  

## 2016-12-15 NOTE — Assessment & Plan Note (Signed)
Followed by endocrinology 

## 2016-12-16 ENCOUNTER — Encounter: Payer: Self-pay | Admitting: Family Medicine

## 2016-12-16 LAB — BASIC METABOLIC PANEL
BUN/Creatinine Ratio: 13 (ref 9–20)
BUN: 13 mg/dL (ref 6–24)
CO2: 24 mmol/L (ref 18–29)
Calcium: 8.9 mg/dL (ref 8.7–10.2)
Chloride: 92 mmol/L — ABNORMAL LOW (ref 96–106)
Creatinine, Ser: 1.04 mg/dL (ref 0.76–1.27)
GFR calc Af Amer: 98 mL/min/{1.73_m2} (ref >59)
GFR calc non Af Amer: 85 mL/min/{1.73_m2} (ref >59)
Glucose: 394 mg/dL — ABNORMAL HIGH (ref 65–99)
Potassium: 4.7 mmol/L (ref 3.5–5.2)
Sodium: 130 mmol/L — ABNORMAL LOW (ref 134–144)

## 2016-12-16 LAB — LP+ALT+AST PICCOLO, WAIVED
ALT (SGPT) Piccolo, Waived: 35 U/L (ref 10–47)
AST (SGOT) Piccolo, Waived: 31 U/L (ref 11–38)
Chol/HDL Ratio Piccolo,Waive: 3.3 mg/dL
Cholesterol Piccolo, Waived: 103 mg/dL (ref <200)
HDL Chol Piccolo, Waived: 31 mg/dL — ABNORMAL LOW (ref >59)
LDL Chol Calc Piccolo Waived: 55 mg/dL (ref <100)
Triglycerides Piccolo,Waived: 85 mg/dL (ref <150)
VLDL Chol Calc Piccolo,Waive: 17 mg/dL (ref <30)

## 2016-12-17 ENCOUNTER — Other Ambulatory Visit: Payer: Self-pay

## 2016-12-17 DIAGNOSIS — E7800 Pure hypercholesterolemia, unspecified: Secondary | ICD-10-CM

## 2016-12-17 NOTE — Telephone Encounter (Signed)
Last OV: 12/15/16 Next OV: 06/22/17  Lab Results  Component Value Date   CHOL 103 12/15/2016   HDL 44 05/28/2016   LDLCALC 62 05/28/2016   TRIG 85 12/15/2016   CHOLHDL 2.7 05/28/2016   Lab Results  Component Value Date   CREATININE 1.04 12/15/2016   BUN 13 12/15/2016   NA 130 (L) 12/15/2016   K 4.7 12/15/2016   CL 92 (L) 12/15/2016   CO2 24 12/15/2016

## 2016-12-18 ENCOUNTER — Other Ambulatory Visit: Payer: Self-pay

## 2016-12-18 DIAGNOSIS — E7800 Pure hypercholesterolemia, unspecified: Secondary | ICD-10-CM

## 2016-12-18 NOTE — Telephone Encounter (Signed)
Routing to provider. Next appt in 06/2017.

## 2016-12-21 MED ORDER — ATORVASTATIN CALCIUM 40 MG PO TABS
40.0000 mg | ORAL_TABLET | Freq: Every day | ORAL | 4 refills | Status: DC
Start: 2016-12-21 — End: 2018-09-05

## 2017-06-22 ENCOUNTER — Encounter: Payer: 59 | Admitting: Family Medicine

## 2017-09-29 ENCOUNTER — Ambulatory Visit (INDEPENDENT_AMBULATORY_CARE_PROVIDER_SITE_OTHER): Payer: 59 | Admitting: Family Medicine

## 2017-09-29 ENCOUNTER — Encounter: Payer: Self-pay | Admitting: Family Medicine

## 2017-09-29 VITALS — BP 122/70 | HR 91 | Temp 98.5°F | Wt 202.0 lb

## 2017-09-29 DIAGNOSIS — R6889 Other general symptoms and signs: Secondary | ICD-10-CM | POA: Diagnosis not present

## 2017-09-29 DIAGNOSIS — J111 Influenza due to unidentified influenza virus with other respiratory manifestations: Secondary | ICD-10-CM

## 2017-09-29 LAB — VERITOR FLU A/B WAIVED
INFLUENZA B: NEGATIVE
Influenza A: POSITIVE — AB

## 2017-09-29 MED ORDER — HYDROCOD POLST-CPM POLST ER 10-8 MG/5ML PO SUER: 2.5000 mL | Freq: Two times a day (BID) | ORAL | 0 refills | Status: DC | PRN

## 2017-09-29 MED ORDER — AZITHROMYCIN 250 MG PO TABS: ORAL_TABLET | ORAL | 0 refills | Status: DC

## 2017-09-29 NOTE — Progress Notes (Signed)
BP 122/70   Pulse 91   Temp 98.5 F (36.9 C) (Oral)   Wt 202 lb (91.6 kg)   SpO2 92%   BMI 31.64 kg/m    Subjective:    Patient ID: Mike Kline, male    DOB: 25-Dec-1970, 47 y.o.   MRN: [id number]  HPI: Mike Kline is a 47 y.o. male  Chief Complaint  Patient presents with  . Cough  . Nasal Congestion  Patient with marked flu symptoms cough cold congestion feeling bad been ongoing for 6 days now and getting worse.  Cough and a great deal call to even just talking. Tried some over-the-counter medications with minimal success. Fever and generalized aching  Relevant past medical, surgical, family and social history reviewed and updated as indicated. Interim medical history since our last visit reviewed. Allergies and medications reviewed and updated.  Review of Systems  Constitutional: Positive for chills, diaphoresis, fatigue and fever.  HENT: Positive for congestion, sinus pressure and sore throat.   Respiratory: Positive for cough, choking and shortness of breath.   Cardiovascular: Negative.     Per HPI unless specifically indicated above     Objective:    BP 122/70   Pulse 91   Temp 98.5 F (36.9 C) (Oral)   Wt 202 lb (91.6 kg)   SpO2 92%   BMI 31.64 kg/m   Wt Readings from Last 3 Encounters:  09/29/17 202 lb (91.6 kg)  12/15/16 202 lb 12.8 oz (92 kg)  06/23/16 200 lb (90.7 kg)    Physical Exam  Constitutional: He is oriented to person, place, and time. He appears well-developed and well-nourished.  HENT:  Head: Normocephalic and atraumatic.  Eyes: Conjunctivae and EOM are normal.  Neck: Normal range of motion.  Cardiovascular: Normal rate, regular rhythm and normal heart sounds.  Pulmonary/Chest: Effort normal.  Marked rhonchi and some wheezes especially in right lower lobe.  Musculoskeletal: Normal range of motion.  Neurological: He is alert and oriented to person, place, and time.  Skin: No erythema.  Psychiatric: He has a normal mood and affect.  His behavior is normal. Judgment and thought content normal.  Flu test positive  Results for orders placed or performed in visit on 12/15/16  Basic metabolic panel  Result Value Ref Range   Glucose 394 (H) 65 - 99 mg/dL   BUN 13 6 - 24 mg/dL   Creatinine, Ser 1.19 0.76 - 1.27 mg/dL   GFR calc non Af Amer 85 >59 mL/min/1.73   GFR calc Af Amer 98 >59 mL/min/1.73   BUN/Creatinine Ratio 13 9 - 20   Sodium 130 (L) 134 - 144 mmol/L   Potassium 4.7 3.5 - 5.2 mmol/L   Chloride 92 (L) 96 - 106 mmol/L   CO2 24 18 - 29 mmol/L   Calcium 8.9 8.7 - 10.2 mg/dL  LP+ALT+AST Piccolo, Waived  Result Value Ref Range   ALT (SGPT) Piccolo, Waived 35 10 - 47 U/L   AST (SGOT) Piccolo, Waived 31 11 - 38 U/L   Cholesterol Piccolo, Waived 103 <200 mg/dL   HDL Chol Piccolo, Waived 31 (L) >59 mg/dL   Triglycerides Piccolo,Waived 85 <150 mg/dL   Chol/HDL Ratio Piccolo,Waive 3.3 mg/dL   LDL Chol Calc Piccolo Waived 55 <100 mg/dL   VLDL Chol Calc Piccolo,Waive 17 <30 mg/dL      Assessment & Plan:   Problem List Items Addressed This Visit    None    Visit Diagnoses    Flu-like symptoms    -  Primary   Relevant Orders   Veritor Flu A/B Waived   Influenza       Relevant Medications   azithromycin (ZITHROMAX) 250 MG tablet      Discuss flu care and treatment symptoms started 6 days ago so too late for antiviral medications but with patient's symptoms of sinus infection bronchitis possible early pneumonia will give Z-Pak and Tussionex. Patient education given on care Tylenol Mucinex Tylenol sinus not driving etc. Follow up plan: Return if symptoms worsen or fail to improve, for As scheduled.

## 2018-08-11 ENCOUNTER — Encounter: Payer: Self-pay | Admitting: Emergency Medicine

## 2018-08-11 ENCOUNTER — Emergency Department: Payer: Medicare Other

## 2018-08-11 ENCOUNTER — Emergency Department
Admission: EM | Admit: 2018-08-11 | Discharge: 2018-08-11 | Disposition: A | Discharge status: Home / Self Care | Payer: Medicare Other | Attending: Emergency Medicine | Admitting: Emergency Medicine

## 2018-08-11 ENCOUNTER — Other Ambulatory Visit: Payer: Self-pay

## 2018-08-11 DIAGNOSIS — Z794 Long term (current) use of insulin: Secondary | ICD-10-CM | POA: Insufficient documentation

## 2018-08-11 DIAGNOSIS — J209 Acute bronchitis, unspecified: Secondary | ICD-10-CM

## 2018-08-11 DIAGNOSIS — I10 Essential (primary) hypertension: Secondary | ICD-10-CM | POA: Diagnosis not present

## 2018-08-11 DIAGNOSIS — Z79899 Other long term (current) drug therapy: Secondary | ICD-10-CM | POA: Insufficient documentation

## 2018-08-11 DIAGNOSIS — R05 Cough: Secondary | ICD-10-CM | POA: Diagnosis present

## 2018-08-11 DIAGNOSIS — E103599 Type 1 diabetes mellitus with proliferative diabetic retinopathy without macular edema, unspecified eye: Secondary | ICD-10-CM | POA: Insufficient documentation

## 2018-08-11 LAB — INFLUENZA PANEL BY PCR (TYPE A & B)
INFLAPCR: NEGATIVE
INFLBPCR: NEGATIVE

## 2018-08-11 LAB — GLUCOSE, CAPILLARY: GLUCOSE-CAPILLARY: 253 mg/dL — AB (ref 70–99)

## 2018-08-11 MED ORDER — HYDROCOD POLST-CPM POLST ER 10-8 MG/5ML PO SUER
5.0000 mL | Freq: Once | ORAL | Status: AC
  Administered 2018-08-11: 5 mL via ORAL
  Filled 2018-08-11: qty 5

## 2018-08-11 MED ORDER — ALBUTEROL SULFATE (2.5 MG/3ML) 0.083% IN NEBU: 5.0000 mg | INHALATION_SOLUTION | Freq: Once | RESPIRATORY_TRACT | Status: DC

## 2018-08-11 MED ORDER — BENZONATATE 100 MG PO CAPS: 100.0000 mg | ORAL_CAPSULE | Freq: Four times a day (QID) | ORAL | 0 refills | Status: DC | PRN

## 2018-08-11 MED ORDER — AZITHROMYCIN 250 MG PO TABS: ORAL_TABLET | ORAL | 0 refills | Status: DC

## 2018-08-11 MED ORDER — AZITHROMYCIN 250 MG PO TABS: ORAL_TABLET | ORAL | 0 refills | Status: AC

## 2018-08-11 NOTE — ED Notes (Signed)
Called pharmacy because medication (Tussionex) needed to be verified,.

## 2018-08-11 NOTE — ED Provider Notes (Signed)
[facility] Emergency Department Provider Note ______   First MD Initiated Contact with Patient 08/11/18 2021     (approximate)  I have reviewed the triage vital signs and the nursing notes.   HISTORY  Chief Complaint Cough and Dehydration    HPI Mike Kline is a 48 y.o. male with below list of chronic medical conditions including diabetes presents to the emergency department with 2-day history of cough congestion chills.  Patient also states that he feels dehydrated as well.  Patient did not receive a flu vaccine this year.  No known sick contact.   Past Medical History:  Diagnosis Date  . Adhesive capsulitis    permanent disability 07/2010, left shoulder  . Blindness of left eye   . Diabetes mellitus without complication (HCC)   . Diabetic, retinopathy, proliferative (HCC)   . GERD (gastroesophageal reflux disease)   . Hyperlipidemia   . Hypertension     Patient Active Problem List   Diagnosis Date Noted  . Insomnia 05/28/2016  . Adhesive capsulitis 12/17/2015  . Hypercholesterolemia 10/22/2015  . Diabetic, retinopathy, proliferative (HCC) 05/28/2015  . Diabetes mellitus type 1 with peripheral artery disease (HCC) 05/28/2015  . Hypertension   . Type I diabetes mellitus with manifestations, uncontrolled (HCC)   . GERD (gastroesophageal reflux disease)   . Nuclear sclerotic cataract 07/05/2013  . Type 1 diabetes mellitus (HCC) 05/29/2013    Past Surgical History:  Procedure Laterality Date  . APPENDECTOMY    . CARPAL TUNNEL RELEASE Bilateral   . cataract surgery Bilateral 2014  . EYE SURGERY Left     Prior to Admission medications   Medication Sig Start Date End Date Taking? Authorizing Provider  albuterol (PROVENTIL HFA) 108 (90 Base) MCG/ACT inhaler Inhale 2 puffs into the lungs every 4 (four) hours as needed for wheezing or shortness of breath. 06/23/16   Sharman Cheek, MD  atorvastatin (LIPITOR) 40 MG tablet Take 1 tablet  (40 mg total) by mouth daily. 12/21/16   Johnson, Megan P, DO  azithromycin (ZITHROMAX Z-PAK) 250 MG tablet Take 2 tablets (500 mg) on  Day 1,  followed by 1 tablet (250 mg) once daily on Days 2 through 5. 08/11/18 08/16/18  Darci Current, MD  azithromycin (ZITHROMAX) 250 MG tablet 2 now then 1 a day 09/29/17   Steele Sizer, MD  benzonatate (TESSALON PERLES) 100 MG capsule Take 1 capsule (100 mg total) by mouth every 6 (six) hours as needed for cough. 08/11/18 08/11/19  Darci Current, MD  chlorpheniramine-HYDROcodone [facility] PENNKINETIC ER) 10-8 MG/5ML SUER Take 2.5-5 mLs by mouth every 12 (twelve) hours as needed for cough. 09/29/17   Steele Sizer, MD  fexofenadine (ALLEGRA) 30 MG tablet Take 30 mg by mouth at bedtime.    [provider]  HUMALOG KWIKPEN 100 UNIT/ML KiwkPen USE THREE TIMES A DAY AS DIRECTED.(UP TO 50 UNITS A DAY OR AS DIRECTED BY SLIDING SCALE) 12/30/15   Steele Sizer, MD  Insulin Glargine (LANTUS SOLOSTAR) 100 UNIT/ML Solostar Pen Inject 60 Units into the skin daily at 10 pm. 01/07/16   Steele Sizer, MD  lansoprazole (PREVACID) 30 MG capsule Take 30 mg by mouth daily at 12 noon.    [provider]  metFORMIN (GLUCOPHAGE-XR) 500 MG 24 hr tablet 1,000 mg daily. Reported on 10/22/2015 07/10/15   [provider]  pramipexole (MIRAPEX) 0.125 MG tablet Take 1 tablet (0.125 mg total) by mouth every evening. Patient not taking: Reported  on 09/29/2017 05/28/16   Steele Sizer, MD  ranitidine (ZANTAC) 150 MG tablet Take 150 mg by mouth 2 (two) times daily.    [provider]  valsartan-hydrochlorothiazide (DIOVAN-HCT) 320-12.5 MG tablet Take 1 tablet by mouth daily.    [provider]    Allergies Tramadol  No family history on file.  Social History Social History   Tobacco Use  . Smoking status: Never Smoker  . Smokeless tobacco: Never Used  Substance Use Topics  . Alcohol use: No  . Drug use: No    Review of  Systems Constitutional: Positive for chills Eyes: No visual changes. ENT: No sore throat. Cardiovascular: Denies chest pain. Respiratory: Denies shortness of breath.  Positive for cough Gastrointestinal: No abdominal pain.  No nausea, no vomiting.  No diarrhea.  No constipation. Genitourinary: Negative for dysuria. Musculoskeletal: Negative for neck pain.  Negative for back pain. Integumentary: Negative for rash. Neurological: Negative for headaches, focal weakness or numbness.  ____________________________________________   PHYSICAL EXAM:  VITAL SIGNS: ED Triage Vitals  Enc Vitals Group     BP 08/11/18 2128 117/61     Pulse Rate 08/11/18 1902 95     Resp 08/11/18 1902 18     Temp 08/11/18 2128 98.1 F (36.7 C)     Temp Source 08/11/18 1902 Oral     SpO2 08/11/18 2128 96 %     Weight 08/11/18 1902 74.8 kg (165 lb)     Height 08/11/18 1902 1.702 m (5\' 7" )     Head Circumference --      Peak Flow --      Pain Score 08/11/18 1902 10     Pain Loc --      Pain Edu? --      Excl. in GC? --     Constitutional: Alert and oriented. Well appearing and in no acute distress. Eyes: Conjunctivae are normal. Mouth/Throat: Mucous membranes are moist.  Oropharynx non-erythematous. Neck: No stridor.   Cardiovascular: Normal rate, regular rhythm. Good peripheral circulation. Grossly normal heart sounds. Respiratory: Normal respiratory effort.  No retractions. Lungs CTAB. Gastrointestinal: Soft and nontender. No distention.  Musculoskeletal: No lower extremity tenderness nor edema. No gross deformities of extremities. Neurologic:  Normal speech and language. No gross focal neurologic deficits are appreciated.  Skin:  Skin is warm, dry and intact. No rash noted.   ____________________________________________   LABS (all labs ordered are listed, but only abnormal results are displayed)  Labs Reviewed  GLUCOSE, CAPILLARY - Abnormal; Notable for the following components:      Result  Value   Glucose-Capillary 253 (*)    All other components within normal limits  INFLUENZA PANEL BY PCR (TYPE A & B)   _____________  RADIOLOGY I, Vidette N Ronniesha Seibold, personally viewed and evaluated these images (plain radiographs) as part of my medical decision making, as well as reviewing the written report by the radiologist.  ED MD interpretation: No active cardiopulmonary disease noted on chest x-ray.  Official radiology report(s): Dg Chest 2 View  Result Date: 08/11/2018 CLINICAL DATA:  Worsening cough. Short of breath and chills over the last several days. EXAM: CHEST - 2 VIEW COMPARISON:  06/23/2016 FINDINGS: Normal heart, mediastinum and hila. Clear lungs.  No pleural effusion or pneumothorax. Skeletal structures are intact. IMPRESSION: No active cardiopulmonary disease. Electronically Signed   By: Amie Portland M.D.   On: 08/11/2018 19:42     Procedures   ____________________________________________   INITIAL IMPRESSION / ASSESSMENT AND PLAN /  ED COURSE  As part of my medical decision making, I reviewed the following data within the electronic MEDICAL RECORD NUMBER  48 year old male presenting with above-stated history and physical exam concerning for pneumonia bronchitis or influenza.  Chest x-ray revealed no evidence of pneumonia influenza negative.  Suspect bronchitis as etiology for the patient's symptoms.  Patient given Tussionex and azithromycin in the emergency department will be prescribed Tessalon and azithromycin for home ____________________________________________  FINAL CLINICAL IMPRESSION(S) / ED DIAGNOSES  Final diagnoses:  Acute bronchitis, unspecified organism     MEDICATIONS GIVEN DURING THIS VISIT:  Medications  albuterol (PROVENTIL) (2.5 MG/3ML) 0.083% nebulizer solution 5 mg (has no administration in time range)  chlorpheniramine-HYDROcodone (TUSSIONEX) 10-8 MG/5ML suspension 5 mL (5 mLs Oral Given 08/11/18 2125)     ED Discharge Orders          Ordered    benzonatate (TESSALON PERLES) 100 MG capsule  Every 6 hours PRN,   Status:  Discontinued     08/11/18 2101    azithromycin (ZITHROMAX Z-PAK) 250 MG tablet  Status:  Discontinued     08/11/18 2101    azithromycin (ZITHROMAX Z-PAK) 250 MG tablet     08/11/18 2127    benzonatate (TESSALON PERLES) 100 MG capsule  Every 6 hours PRN     08/11/18 2127           Note:  This document was prepared using Dragon voice recognition software and may include unintentional dictation errors.    Darci CurrentBrown, [place] N, MD 08/11/18 [phone]

## 2018-08-11 NOTE — ED Triage Notes (Signed)
Pt here with c/o worsening cough, shob, chills over the past few days, wife states possible dehydration as well. Pt diabetic, bg in triage 235. No resp distress at this time.

## 2018-09-05 ENCOUNTER — Encounter

## 2018-09-05 ENCOUNTER — Ambulatory Visit
Admission: RE | Admit: 2018-09-05 | Discharge: 2018-09-05 | Disposition: A | Discharge status: Home / Self Care | Payer: Medicare Other | Source: Ambulatory Visit | Attending: Family Medicine | Admitting: Family Medicine

## 2018-09-05 ENCOUNTER — Ambulatory Visit (INDEPENDENT_AMBULATORY_CARE_PROVIDER_SITE_OTHER): Payer: Medicare Other | Admitting: Family Medicine

## 2018-09-05 ENCOUNTER — Encounter: Payer: Self-pay | Admitting: Family Medicine

## 2018-09-05 VITALS — BP 148/84 | HR 89 | Temp 98.5°F | Ht 67.0 in | Wt 176.6 lb

## 2018-09-05 DIAGNOSIS — R062 Wheezing: Secondary | ICD-10-CM | POA: Insufficient documentation

## 2018-09-05 DIAGNOSIS — J069 Acute upper respiratory infection, unspecified: Secondary | ICD-10-CM

## 2018-09-05 MED ORDER — ALBUTEROL SULFATE (2.5 MG/3ML) 0.083% IN NEBU
2.5000 mg | INHALATION_SOLUTION | Freq: Once | RESPIRATORY_TRACT | Status: AC
  Administered 2018-09-05: 2.5 mg via RESPIRATORY_TRACT

## 2018-09-05 MED ORDER — PROMETHAZINE-DM 6.25-15 MG/5ML PO SYRP: 5.0000 mL | ORAL_SOLUTION | Freq: Four times a day (QID) | ORAL | 0 refills | Status: DC | PRN

## 2018-09-05 MED ORDER — PREDNISONE 10 MG PO TABS: ORAL_TABLET | ORAL | 0 refills | Status: DC

## 2018-09-05 MED ORDER — DOXYCYCLINE HYCLATE 100 MG PO TABS: 100.0000 mg | ORAL_TABLET | Freq: Two times a day (BID) | ORAL | 0 refills | Status: DC

## 2018-09-05 NOTE — Progress Notes (Signed)
BP (!) 148/84 (BP Location: Left Arm, Patient Position: Sitting, Cuff Size: Normal)   Pulse 89   Temp 98.5 F (36.9 C)   Ht 5\' 7"  (1.702 m)   Wt 176 lb 9 oz (80.1 kg)   SpO2 99%   BMI 27.65 kg/m    Subjective:    Patient ID: Mike Kline, male    DOB: 09/13/1970, 48 y.o.   MRN: [id number]  HPI: Mike DrownDavid T Gilder is a 48 y.o. male  Chief Complaint  Patient presents with  . Cough    patient has been on several different medications, he finished his prednisone this morning, still coughing, not any better. He was diagnosed with pneumonia at Urgent Care   Here today with over a month of URI sxs. Dx'd Jan 3rd in ER with bronchitis and given a zpak which did not help. Then had another zpak and still not getting better. Tessalon perles not helping with the cough. [facility] and given albuterol, levaquin, cough syrups, and prednisone with mild temporary relief but still feeling about the same overall.   Relevant past medical, surgical, family and social history reviewed and updated as indicated. Interim medical history since our last visit reviewed. Allergies and medications reviewed and updated.  Review of Systems  Per HPI unless specifically indicated above     Objective:    BP (!) 148/84 (BP Location: Left Arm, Patient Position: Sitting, Cuff Size: Normal)   Pulse 89   Temp 98.5 F (36.9 C)   Ht 5\' 7"  (1.702 m)   Wt 176 lb 9 oz (80.1 kg)   SpO2 99%   BMI 27.65 kg/m   Wt Readings from Last 3 Encounters:  09/05/18 176 lb 9 oz (80.1 kg)  08/11/18 165 lb (74.8 kg)  09/29/17 202 lb (91.6 kg)    Physical Exam Vitals signs and nursing note reviewed.  Constitutional:      Appearance: He is well-developed.  HENT:     Head: Atraumatic.     Right Ear: External ear normal.     Left Ear: External ear normal.     Nose:     Comments: Nasal mucosa erythematous    Mouth/Throat:     Pharynx: Posterior oropharyngeal erythema present. No oropharyngeal exudate.   Eyes:     Conjunctiva/sclera: Conjunctivae normal.     Pupils: Pupils are equal, round, and reactive to light.  Neck:     Musculoskeletal: Normal range of motion and neck supple.  Cardiovascular:     Rate and Rhythm: Normal rate and regular rhythm.  Pulmonary:     Effort: Pulmonary effort is normal. No respiratory distress.     Breath sounds: Wheezing present. No rales.  Musculoskeletal: Normal range of motion.  Lymphadenopathy:     Cervical: No cervical adenopathy.  Skin:    General: Skin is warm and dry.  Neurological:     Mental Status: He is alert and oriented to person, place, and time.  Psychiatric:        Behavior: Behavior normal.     Results for orders placed or performed during the hospital encounter of 08/11/18  Glucose, capillary  Result Value Ref Range   Glucose-Capillary 253 (H) 70 - 99 mg/dL  Influenza panel by PCR (type A & B)  Result Value Ref Range   Influenza A By PCR NEGATIVE NEGATIVE   Influenza B By PCR NEGATIVE NEGATIVE      Assessment & Plan:   Problem  List Items Addressed This Visit    None    Visit Diagnoses    Upper respiratory tract infection, unspecified type    -  Primary   Appears improved on exam but still with significant inflammation in chest. Will start doxycycline and another round of pred. Promethazine DM for cough   Wheezing       Nebulizer tx given in clinic with good relief.    Relevant Medications   albuterol (PROVENTIL) (2.5 MG/3ML) 0.083% nebulizer solution 2.5 mg (Completed)   Other Relevant Orders   DG Chest 2 View (Completed)       Follow up plan: Return in about 2 weeks (around 09/19/2018) for Lung recheck.

## 2018-09-12 ENCOUNTER — Telehealth: Payer: Self-pay | Admitting: Family Medicine

## 2018-09-12 NOTE — Telephone Encounter (Signed)
If he's still not better he needs to be seen. He's had 2 rounds of prednisone and 3 antibiotics now

## 2018-09-12 NOTE — Telephone Encounter (Signed)
Copied from CRM [id number]. Topic: General - Inquiry >> Sep 12, 2018 10:03 AM Maia Petties wrote: Reason for CRM: pt states that he is still coughing constantly. Sometimes it is productive but other times it is not and he'll cough for an hour non-stop. He takes the cough medicine at night because it knocks him out (promethazine-dextromethorphan (PROMETHAZINE-DM) 6.25-15 MG/5ML syrup). Pt states that he had xray 09/05/2018 and was advised he did not have pneumonia. 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 Pt is asking for advise on what to do. Pt has had tessalon pearls before and they didn't help him. OTC cough medication is not helping either. Please call back.

## 2018-09-12 NOTE — Telephone Encounter (Signed)
Appointment scheduled.

## 2018-09-13 ENCOUNTER — Encounter: Payer: Self-pay | Admitting: Family Medicine

## 2018-09-13 ENCOUNTER — Ambulatory Visit (INDEPENDENT_AMBULATORY_CARE_PROVIDER_SITE_OTHER): Payer: Medicare Other | Admitting: Family Medicine

## 2018-09-13 VITALS — BP 131/68 | HR 86 | Temp 98.4°F | Ht 67.0 in | Wt 172.4 lb

## 2018-09-13 DIAGNOSIS — R062 Wheezing: Secondary | ICD-10-CM | POA: Diagnosis not present

## 2018-09-13 DIAGNOSIS — J40 Bronchitis, not specified as acute or chronic: Secondary | ICD-10-CM | POA: Diagnosis not present

## 2018-09-13 MED ORDER — CETIRIZINE HCL 10 MG PO TABS: 10.0000 mg | ORAL_TABLET | Freq: Every day | ORAL | 11 refills | Status: DC

## 2018-09-13 MED ORDER — ALBUTEROL SULFATE (2.5 MG/3ML) 0.083% IN NEBU
2.5000 mg | INHALATION_SOLUTION | Freq: Once | RESPIRATORY_TRACT | Status: AC
  Administered 2018-09-13: 2.5 mg via RESPIRATORY_TRACT

## 2018-09-13 MED ORDER — ALBUTEROL SULFATE HFA 108 (90 BASE) MCG/ACT IN AERS: 2.0000 | INHALATION_SPRAY | RESPIRATORY_TRACT | 3 refills | Status: DC | PRN

## 2018-09-13 MED ORDER — PROMETHAZINE-DM 6.25-15 MG/5ML PO SYRP: 5.0000 mL | ORAL_SOLUTION | Freq: Four times a day (QID) | ORAL | 0 refills | Status: DC | PRN

## 2018-09-13 NOTE — Progress Notes (Signed)
BP 131/68 (BP Location: Right Arm, Patient Position: Sitting, Cuff Size: Normal)   Pulse 86   Temp 98.4 F (36.9 C) (Oral)   Ht 5\' 7"  (1.702 m)   Wt 172 lb 6.4 oz (78.2 kg)   SpO2 98%   BMI 27.00 kg/m    Subjective:    Patient ID: Mike Kline, male    DOB: 11/15/1970, 48 y.o.   MRN: [id number]  HPI: Mike DrownDavid T Charity is a 48 y.o. male  Chief Complaint  Patient presents with  . Cough    Ongoing since Christmas. Cough worsening. Productive. Patient states he becomes dizzy from coughing so hard.  . Shortness of Breath  . Chest Pain   Here today following up on pneumonia diagnosed at UC 2 weeks ago. Has now completed a course of azithromycin, levaquin, and doxycycline plus two steroid tapers. Not feeling much better, and nothing seems to be helping other than the phenergan DM for bedtime. States he's still having chest tightness, severe cough, SOB. No fevers the past week, and CXR last week without evidence of pneumonia.   Relevant past medical, surgical, family and social history reviewed and updated as indicated. Interim medical history since our last visit reviewed. Allergies and medications reviewed and updated.  Review of Systems  Per HPI unless specifically indicated above     Objective:    BP 131/68 (BP Location: Right Arm, Patient Position: Sitting, Cuff Size: Normal)   Pulse 86   Temp 98.4 F (36.9 C) (Oral)   Ht 5\' 7"  (1.702 m)   Wt 172 lb 6.4 oz (78.2 kg)   SpO2 98%   BMI 27.00 kg/m   Wt Readings from Last 3 Encounters:  09/13/18 172 lb 6.4 oz (78.2 kg)  09/05/18 176 lb 9 oz (80.1 kg)  08/11/18 165 lb (74.8 kg)    Physical Exam Vitals signs and nursing note reviewed.  Constitutional:      Appearance: He is well-developed.  HENT:     Head: Atraumatic.     Right Ear: External ear normal.     Left Ear: External ear normal.     Nose:     Comments: Nasal mucosa erythematous    Mouth/Throat:     Pharynx: Posterior oropharyngeal erythema present. No  oropharyngeal exudate.  Eyes:     Conjunctiva/sclera: Conjunctivae normal.     Pupils: Pupils are equal, round, and reactive to light.  Neck:     Musculoskeletal: Normal range of motion and neck supple.  Cardiovascular:     Rate and Rhythm: Normal rate and regular rhythm.  Pulmonary:     Effort: Pulmonary effort is normal. No respiratory distress.     Breath sounds: Wheezing present. No rales.  Musculoskeletal: Normal range of motion.  Lymphadenopathy:     Cervical: No cervical adenopathy.  Skin:    General: Skin is warm and dry.  Neurological:     Mental Status: He is alert and oriented to person, place, and time.  Psychiatric:        Behavior: Behavior normal.     Results for orders placed or performed during the hospital encounter of 08/11/18  Glucose, capillary  Result Value Ref Range   Glucose-Capillary 253 (H) 70 - 99 mg/dL  Influenza panel by PCR (type A & B)  Result Value Ref Range   Influenza A By PCR NEGATIVE NEGATIVE   Influenza B By PCR NEGATIVE NEGATIVE      Assessment & Plan:   Problem List Items  Addressed This Visit    None    Visit Diagnoses    Bronchitis    -  Primary   Wheezing       Relevant Medications   albuterol (PROVENTIL) (2.5 MG/3ML) 0.083% nebulizer solution 2.5 mg (Completed)    Clinically appears to be improving. Suspect residual sxs from inflammation. Excellent improvement with neb tx in clinic. symbicort inhaler sample given, albuterol and phenergan DM. Strict return precautions given. Recommended initiating daily zyrtec in case allergic component to sxs.   Follow up plan: Return if symptoms worsen or fail to improve.

## 2018-09-13 NOTE — Patient Instructions (Signed)
Use your albuterol inhaler every 4-6 hours, use your symbicort inhaler twice daily (rinse mouth with water after each use)  Use the phenergan DM as needed for cough, especially at bedtime. Do not drive with it because it can be very sedating  Start taking zyrtec (allergy tablet) every day

## 2018-09-19 ENCOUNTER — Ambulatory Visit: Payer: Medicare Other | Admitting: Family Medicine

## 2018-10-21 ENCOUNTER — Other Ambulatory Visit: Payer: Self-pay | Admitting: Family Medicine

## 2018-10-21 NOTE — Telephone Encounter (Signed)
Called pt no answer lmom. If he calls back he needs an appointment to have med refilled

## 2018-10-21 NOTE — Telephone Encounter (Signed)
Requested medication (s) are due for refill today: yes  Requested medication (s) are on the active medication list: yes  Last refill:  Last refilled on 05/28/16 #90 with 4 refills  Future visit scheduled: No  Notes to clinic:  Last refilled in 2017.LOV on 09/13/18    Requested Prescriptions  Pending Prescriptions Disp Refills   pramipexole (MIRAPEX) 0.25 MG tablet [Pharmacy Med Name: PRAMIPEXOLE 0.25 MG TABLET] 15 tablet 0    Sig: TAKE 1/2 TABLET EACH EVENING.     Neurology:  Parkinsonian Agents Passed - 10/21/2018  2:12 PM      Passed - Last BP in normal range    BP Readings from Last 1 Encounters:  09/13/18 131/68         Passed - Valid encounter within last 12 months    Recent Outpatient Visits          1 month ago Bronchitis   [facility] Particia [place], [place]   1 month ago Upper respiratory tract infection, unspecified type   [facility], Salley Hews, [place]   1 year ago Flu-like symptoms   [facility] [place], Redge Gainer, [place]   1 year ago Hypercholesterolemia   [facility] Dossie [place], Redge Gainer, MD   2 years ago Cough   [facility] [place], [place], [place]

## 2018-10-21 NOTE — Telephone Encounter (Signed)
Needs appointment

## 2018-10-24 ENCOUNTER — Encounter: Payer: Self-pay | Admitting: Family Medicine

## 2018-10-24 NOTE — Telephone Encounter (Signed)
Left message on machine for pt to return call to the office.  

## 2018-10-24 NOTE — Telephone Encounter (Signed)
Letter printed to mail for med refill.

## 2018-10-27 ENCOUNTER — Other Ambulatory Visit: Payer: Self-pay | Admitting: Family Medicine

## 2018-10-27 MED ORDER — PRAMIPEXOLE DIHYDROCHLORIDE 0.125 MG PO TABS: 0.1250 mg | ORAL_TABLET | Freq: Every evening | ORAL | 1 refills | Status: DC

## 2019-04-14 ENCOUNTER — Ambulatory Visit
Admission: RE | Admit: 2019-04-14 | Discharge: 2019-04-14 | Disposition: A | Discharge status: Home / Self Care | Payer: Medicare Other | Attending: Family Medicine | Admitting: Family Medicine

## 2019-04-14 ENCOUNTER — Other Ambulatory Visit: Payer: Self-pay

## 2019-04-14 ENCOUNTER — Encounter: Payer: Self-pay | Admitting: Family Medicine

## 2019-04-14 ENCOUNTER — Ambulatory Visit
Admission: RE | Admit: 2019-04-14 | Discharge: 2019-04-14 | Disposition: A | Discharge status: Home / Self Care | Payer: Medicare Other | Source: Ambulatory Visit | Attending: Family Medicine | Admitting: Family Medicine

## 2019-04-14 ENCOUNTER — Ambulatory Visit (INDEPENDENT_AMBULATORY_CARE_PROVIDER_SITE_OTHER): Payer: Medicare Other | Admitting: Family Medicine

## 2019-04-14 VITALS — BP 134/76 | HR 84 | Temp 98.7°F | Ht 67.0 in | Wt 195.0 lb

## 2019-04-14 DIAGNOSIS — R1013 Epigastric pain: Secondary | ICD-10-CM

## 2019-04-14 DIAGNOSIS — R079 Chest pain, unspecified: Secondary | ICD-10-CM | POA: Insufficient documentation

## 2019-04-14 DIAGNOSIS — R0602 Shortness of breath: Secondary | ICD-10-CM | POA: Insufficient documentation

## 2019-04-14 DIAGNOSIS — R9431 Abnormal electrocardiogram [ECG] [EKG]: Secondary | ICD-10-CM

## 2019-04-14 DIAGNOSIS — F419 Anxiety disorder, unspecified: Secondary | ICD-10-CM

## 2019-04-14 DIAGNOSIS — Z23 Encounter for immunization: Secondary | ICD-10-CM

## 2019-04-14 DIAGNOSIS — K219 Gastro-esophageal reflux disease without esophagitis: Secondary | ICD-10-CM

## 2019-04-14 MED ORDER — FLUOXETINE HCL 20 MG PO TABS: 20.0000 mg | ORAL_TABLET | Freq: Every day | ORAL | 3 refills | Status: DC

## 2019-04-14 MED ORDER — SUCRALFATE 1 G PO TABS: 1.0000 g | ORAL_TABLET | Freq: Three times a day (TID) | ORAL | 0 refills | Status: DC

## 2019-04-14 MED ORDER — PANTOPRAZOLE SODIUM 40 MG PO TBEC: 40.0000 mg | DELAYED_RELEASE_TABLET | Freq: Every day | ORAL | 3 refills | Status: DC

## 2019-04-14 NOTE — Patient Instructions (Signed)
[place]

## 2019-04-14 NOTE — Progress Notes (Signed)
BP 134/76   Pulse 84   Temp 98.7 F (37.1 C) (Oral)   Ht 5\' 7"  (1.702 m)   Wt 195 lb (88.5 kg)   SpO2 97%   BMI 30.54 kg/m    Subjective:    Patient ID: Mike Kline, male    DOB: April 20, 1971, 48 y.o.   MRN: [id number]  HPI: Mike Kline is a 48 y.o. male  Chief Complaint  Patient presents with  . Abdominal Pain    upper abdomen discomfort. pt states that bending down and eating make the pain worse. states symptoms started long time ago but gotten worse lately   Patient presenting today for multiple issues.  Chest pain and SOB radiating across chest that's been coming and going for about 6+ months. Does have a hx of reflux and states his friend's prilosec does seem to ease it some. Also has been diagnosed with pulled muscles in the past near this area as he lifts heavy and does boxing exercises often. One episode of vomiting, but phenergan helped. Has some bloating, early satiety, and epigastric pain that seems worse with eating or drinking. No hematemesis, melena, fevers, chills, diarrhea, constipation. Concerned about a hiatal hernia as well as sister has one and states similar issues. No known hx of heart disease or gallbladder issues.   Notes the SOB has been happening since his episode of bronchitis about 8 months ago, but worse since the chest pain started coming on stronger the past few weeks. Seems to be worse with exertion and notes his exercise tolerance is lower than before being sick. Not trying anything OTC for this.   Anxiety for a long period of time now. States he's always just toughed it out but now that the pandemic and his divorce is going on he's struggling more than ever. Feeling constantly anxious, worrying over everything and lacking motivation to go out and do anything. Denies SI/HI.   Depression screen [facility] 2/9 12/15/2016 05/28/2016  Decreased Interest 0 0  Down, Depressed, Hopeless 0 2  PHQ - 2 Score 0 2  Altered sleeping - 3  Tired, decreased energy - 3   Change in appetite - 0  Feeling bad or failure about yourself  - 0  Trouble concentrating - 0  Moving slowly or fidgety/restless - 0  Suicidal thoughts - 0  PHQ-9 Score - 8  No flowsheet data found.  Relevant past medical, surgical, family and social history reviewed and updated as indicated. Interim medical history since our last visit reviewed. Allergies and medications reviewed and updated.  Review of Systems  Per HPI unless specifically indicated above     Objective:    BP 134/76   Pulse 84   Temp 98.7 F (37.1 C) (Oral)   Ht 5\' 7"  (1.702 m)   Wt 195 lb (88.5 kg)   SpO2 97%   BMI 30.54 kg/m   Wt Readings from Last 3 Encounters:  04/14/19 195 lb (88.5 kg)  09/13/18 172 lb 6.4 oz (78.2 kg)  09/05/18 176 lb 9 oz (80.1 kg)    Physical Exam Vitals signs and nursing note reviewed.  Constitutional:      Appearance: Normal appearance.  HENT:     Head: Atraumatic.  Eyes:     Extraocular Movements: Extraocular movements intact.     Conjunctiva/sclera: Conjunctivae normal.  Neck:     Musculoskeletal: Normal range of motion and neck supple.  Cardiovascular:     Rate and Rhythm: Normal rate and regular  rhythm.     Heart sounds: Normal heart sounds.  Pulmonary:     Effort: Pulmonary effort is normal. No respiratory distress.     Breath sounds: Normal breath sounds. No wheezing or rales.  Abdominal:     General: Bowel sounds are normal. There is no distension.     Palpations: Abdomen is soft. There is no mass.     Tenderness: There is abdominal tenderness (mild lower central abdominal pain, moderate epigastric pain that is more mild b/l.). There is no right CVA tenderness, left CVA tenderness, guarding or rebound.     Comments: Neg murphy's sign  Musculoskeletal: Normal range of motion.  Skin:    General: Skin is warm and dry.  Neurological:     General: No focal deficit present.     Mental Status: He is oriented to person, place, and time.  Psychiatric:        Mood  and Affect: Mood normal.        Thought Content: Thought content normal.        Judgment: Judgment normal.     Results for orders placed or performed during the hospital encounter of 08/11/18  Glucose, capillary  Result Value Ref Range   Glucose-Capillary 253 (H) 70 - 99 mg/dL  Influenza panel by PCR (type A & B)  Result Value Ref Range   Influenza A By PCR NEGATIVE NEGATIVE   Influenza B By PCR NEGATIVE NEGATIVE      Assessment & Plan:   Problem List Items Addressed This Visit      Digestive   GERD (gastroesophageal reflux disease)    Will start daily protonix, and prn carafate given current flare which could be gastritis vs gastric ulcer. Will also r/o H pylori infection given persistence of sxs and bloating/early satiety. Referral placed to GI for further evaluation and consideration for colonoscopy as he's due      Relevant Medications   sucralfate (CARAFATE) 1 g tablet   pantoprazole (PROTONIX) 40 MG tablet   Other Relevant Orders   Ambulatory referral to Gastroenterology     Other   Anxiety    Will start prozac daily and monitor closely for benefit. Already takes low dose elavil for insomnia and neuropathy. May need to add a prn such as hydroxyzine if still not under good control. Consider counseling if desired      Relevant Medications   FLUoxetine (PROZAC) 20 MG tablet    Other Visit Diagnoses    Epigastric pain    -  Primary   Unclear etiology, less supsicion for gallbladder etiology. Suspect gastritis. Refer to GI, start protonix and carafate, bland diet. Return precautions given   Relevant Orders   CBC with Differential/Platelet   Comprehensive metabolic panel   Lipase   Helicobacter pylori abs-IgG+IgA, bld   Ambulatory referral to Gastroenterology   Chest pain, unspecified type       EKG showing possible old infarct, but no acute changes. Discussed without cardiac enzymes could not r/o infarct and to go to ER if worsening/not improving   Relevant Orders    EKG 12-Lead (Completed)   DG Chest 2 View   SOB (shortness of breath)       Suspect lingering scarring/inflammation from significant bronchitis/pneumonia earlier this year. CTAB today but obtain CXR for reassurance   Relevant Orders   DG Chest 2 View   Abnormal EKG       Will refer to Cardiology for further evaluation. Strict ER precautions given if not  improving in meantime   Relevant Orders   Ambulatory referral to Cardiology   Flu vaccine need       Relevant Orders   Flu Vaccine QUAD 36+ mos IM (Completed)    Greater than 45 min spent today in direct care and coordination with patient.    Follow up plan: Return in about 4 weeks (around 05/12/2019) for anxiety, CP f/u.

## 2019-04-14 NOTE — Assessment & Plan Note (Signed)
Will start prozac daily and monitor closely for benefit. Already takes low dose elavil for insomnia and neuropathy. May need to add a prn such as hydroxyzine if still not under good control. Consider counseling if desired

## 2019-04-14 NOTE — Assessment & Plan Note (Signed)
Will start daily protonix, and prn carafate given current flare which could be gastritis vs gastric ulcer. Will also r/o H pylori infection given persistence of sxs and bloating/early satiety. Referral placed to GI for further evaluation and consideration for colonoscopy as he's due

## 2019-04-18 ENCOUNTER — Telehealth: Payer: Self-pay

## 2019-04-18 LAB — CBC WITH DIFFERENTIAL/PLATELET
Basophils Absolute: 0 10*3/uL (ref 0.0–0.2)
Basos: 1 %
EOS (ABSOLUTE): 0.1 10*3/uL (ref 0.0–0.4)
Eos: 2 %
Hematocrit: 48.7 % (ref 37.5–51.0)
Hemoglobin: 15.8 g/dL (ref 13.0–17.7)
Immature Grans (Abs): 0 10*3/uL (ref 0.0–0.1)
Immature Granulocytes: 1 %
Lymphocytes Absolute: 1.3 10*3/uL (ref 0.7–3.1)
Lymphs: 21 %
MCH: 27.3 pg (ref 26.6–33.0)
MCHC: 32.4 g/dL (ref 31.5–35.7)
MCV: 84 fL (ref 79–97)
Monocytes Absolute: 0.4 10*3/uL (ref 0.1–0.9)
Monocytes: 7 %
Neutrophils Absolute: 4 10*3/uL (ref 1.4–7.0)
Neutrophils: 68 %
Platelets: 285 10*3/uL (ref 150–450)
RBC: 5.78 x10E6/uL (ref 4.14–5.80)
RDW: 13.7 % (ref 11.6–15.4)
WBC: 5.9 10*3/uL (ref 3.4–10.8)

## 2019-04-18 LAB — COMPREHENSIVE METABOLIC PANEL
ALT: 18 IU/L (ref 0–44)
AST: 17 IU/L (ref 0–40)
Albumin/Globulin Ratio: 1.6 (ref 1.2–2.2)
Albumin: 4.6 g/dL (ref 4.0–5.0)
Alkaline Phosphatase: 95 IU/L (ref 39–117)
BUN/Creatinine Ratio: 9 (ref 9–20)
BUN: 11 mg/dL (ref 6–24)
Bilirubin Total: 0.3 mg/dL (ref 0.0–1.2)
CO2: 25 mmol/L (ref 20–29)
Calcium: 9.6 mg/dL (ref 8.7–10.2)
Chloride: 97 mmol/L (ref 96–106)
Creatinine, Ser: 1.21 mg/dL (ref 0.76–1.27)
GFR calc Af Amer: 80 mL/min/{1.73_m2} (ref >59)
GFR calc non Af Amer: 69 mL/min/{1.73_m2} (ref >59)
Globulin, Total: 2.9 g/dL (ref 1.5–4.5)
Glucose: 170 mg/dL — ABNORMAL HIGH (ref 65–99)
Potassium: 4.1 mmol/L (ref 3.5–5.2)
Sodium: 137 mmol/L (ref 134–144)
Total Protein: 7.5 g/dL (ref 6.0–8.5)

## 2019-04-18 LAB — HELICOBACTER PYLORI ABS-IGG+IGA, BLD
H. pylori, IgA Abs: <9 units (ref 0.0–8.9)
H. pylori, IgG AbS: 0.3 Index Value (ref 0.00–0.79)

## 2019-04-18 LAB — LIPASE: Lipase: 14 U/L (ref 13–78)

## 2019-04-18 NOTE — Telephone Encounter (Signed)
Patient requesting RX be changed to a 90 day supply on Fluoxetine.

## 2019-04-19 NOTE — Telephone Encounter (Signed)
Patient notified

## 2019-04-19 NOTE — Telephone Encounter (Signed)
This is a new start medication which is why it was written for 1 month. Once we settle on a medication/dose we will start doing 90 day supplies

## 2019-04-20 ENCOUNTER — Encounter: Payer: Self-pay | Admitting: Gastroenterology

## 2019-04-20 ENCOUNTER — Ambulatory Visit (INDEPENDENT_AMBULATORY_CARE_PROVIDER_SITE_OTHER): Payer: Medicare Other | Admitting: Gastroenterology

## 2019-04-20 ENCOUNTER — Other Ambulatory Visit: Payer: Self-pay

## 2019-04-20 VITALS — BP 111/72 | HR 77 | Temp 98.3°F | Wt 196.4 lb

## 2019-04-20 DIAGNOSIS — R1900 Intra-abdominal and pelvic swelling, mass and lump, unspecified site: Secondary | ICD-10-CM

## 2019-04-20 DIAGNOSIS — R0789 Other chest pain: Secondary | ICD-10-CM

## 2019-04-20 DIAGNOSIS — K219 Gastro-esophageal reflux disease without esophagitis: Secondary | ICD-10-CM | POA: Diagnosis not present

## 2019-04-20 NOTE — Patient Instructions (Signed)

## 2019-04-23 ENCOUNTER — Encounter: Payer: Self-pay | Admitting: Family Medicine

## 2019-04-23 NOTE — Progress Notes (Signed)
Mike Kline [address]  [address]  [place], [place] [address]  Main: [phone]  Fax: [phone]   Gastroenterology Consultation  Referring Provider:     Particia Nearing,* Primary Care Physician:  Steele Sizer, MD Reason for Consultation:     Epigastric pain        HPI:    Chief Complaint  Patient presents with   Abdominal Pain    Patient is having epigastric pain     CARBON TREADWAY is a 48 y.o. y/o male referred for consultation & management  by Dr. Steele Sizer, MD.  Patient reports intermittent, 4 to 6-week history of chest wall pain, not related to meals or exertion.  Sometimes takes his breath away.  Describes it as a dull pain, nonradiating, 5/10.  No nausea or vomiting.  Describes working out in pain that is elicited by pressing on the chest in the area of the pain.  However, took his friend's omeprazole which helped.  PCP put him on pantoprazole which also helps.  Is pending cardiology work-up due to chest wall pain.  No prior EGD or colonoscopy.  No dysphagia.  No altered bowel habits or blood in stool.  No family history of colon cancer.  Past Medical History:  Diagnosis Date   Adhesive capsulitis    permanent disability 07/2010, left shoulder   Blindness of left eye    Diabetes mellitus without complication (HCC)    Diabetic, retinopathy, proliferative (HCC)    GERD (gastroesophageal reflux disease)    Hyperlipidemia    Hypertension     Past Surgical History:  Procedure Laterality Date   APPENDECTOMY     CARPAL TUNNEL RELEASE Bilateral    cataract surgery Bilateral 2014   EYE SURGERY Left     Prior to Admission medications   Medication Sig Start Date End Date Taking? Authorizing Provider  albuterol (PROVENTIL HFA) 108 (90 Base) MCG/ACT inhaler Inhale 2 puffs into the lungs every 4 (four) hours as needed for wheezing or shortness of breath. 09/13/18  Yes Particia Nearing, PA-C  amLODipine (NORVASC) 5 MG tablet   09/03/18  Yes [provider]  cetirizine (ZYRTEC) 10 MG tablet Take 1 tablet (10 mg total) by mouth daily. 09/13/18  Yes Particia Nearing, PA-C  FLUoxetine (PROZAC) 20 MG tablet Take 1 tablet (20 mg total) by mouth daily. 04/14/19  Yes Particia Nearing, PA-C  HUMALOG KWIKPEN 100 UNIT/ML KiwkPen USE THREE TIMES A DAY AS DIRECTED.(UP TO 50 UNITS A DAY OR AS DIRECTED BY SLIDING SCALE) 12/30/15  Yes Crissman, Redge Gainer, MD  hydrochlorothiazide (HYDRODIURIL) 25 MG tablet  09/03/18  Yes [provider]  Insulin Glargine (LANTUS SOLOSTAR) 100 UNIT/ML Solostar Pen Inject 60 Units into the skin daily at 10 pm. 01/07/16  Yes Steele Sizer, MD  losartan (COZAAR) 100 MG tablet  09/03/18  Yes [provider]  pantoprazole (PROTONIX) 40 MG tablet Take 1 tablet (40 mg total) by mouth daily. 04/14/19  Yes Particia Nearing, PA-C  pramipexole (MIRAPEX) 0.125 MG tablet Take 1 tablet (0.125 mg total) by mouth every evening. 10/27/18  Yes Crissman, Redge Gainer, MD  sucralfate (CARAFATE) 1 g tablet Take 1 tablet (1 g total) by mouth 4 (four) times daily -  with meals and at bedtime. 04/14/19  Yes Particia Nearing, PA-C  amitriptyline Caleb Popp) 25 MG tablet Take by mouth. 02/02/18 02/02/19  [provider]    History reviewed. No pertinent family history.  Social History   Tobacco Use   Smoking status: Never Smoker   Smokeless tobacco: Never Used  Substance Use Topics   Alcohol use: No   Drug use: No    Allergies as of 04/20/2019   (No Known Allergies)    Review of Systems:    All systems reviewed and negative except where noted in HPI.   Physical Exam:  BP 111/72 (BP Location: Left Arm, Patient Position: Sitting, Cuff Size: Normal)    Pulse 77    Temp 98.3 F (36.8 C) (Oral)    Wt 196 lb 6 oz (89.1 kg)    BMI 30.76 kg/m  No LMP for male patient. Psych:  Alert and cooperative. Normal mood and affect. General:   Alert,  Well-developed, well-nourished,  pleasant and cooperative in NAD Head:  Normocephalic and atraumatic. Eyes:  Sclera clear, no icterus.   Conjunctiva pink. Ears:  Normal auditory acuity. Nose:  No deformity, discharge, or lesions. Mouth:  No deformity or lesions,oropharynx pink & moist. Neck:  Supple; no masses or thyromegaly. Chest: Reproducible chest wall pain in the mid chest Abdomen: Unilateral small nontender abdominal wall hernia present , normal bowel sounds.  No bruits.  Soft, non-tender and non-distended without masses, or hepatosplenomegaly noted.  No guarding or rebound tenderness.    Msk:  Symmetrical without gross deformities. Good, equal movement & strength bilaterally. Pulses:  Normal pulses noted. Extremities:  No clubbing or edema.  No cyanosis. Neurologic:  Alert and oriented x3;  grossly normal neurologically. Skin:  Intact without significant lesions or rashes. No jaundice. Lymph Nodes:  No significant cervical adenopathy. Psych:  Alert and cooperative. Normal mood and affect.   Labs: CBC    Component Value Date/Time   WBC 5.9 04/14/2019 1141   WBC 12.8 (H) 06/23/2016 1301   RBC 5.78 04/14/2019 1141   RBC 5.11 06/23/2016 1301   HGB 15.8 04/14/2019 1141   HCT 48.7 04/14/2019 1141   PLT 285 04/14/2019 1141   MCV 84 04/14/2019 1141   MCH 27.3 04/14/2019 1141   MCH 27.9 06/23/2016 1301   MCHC 32.4 04/14/2019 1141   MCHC 33.8 06/23/2016 1301   RDW 13.7 04/14/2019 1141   LYMPHSABS 1.3 04/14/2019 1141   EOSABS 0.1 04/14/2019 1141   BASOSABS 0.0 04/14/2019 1141   CMP     Component Value Date/Time   NA 137 04/14/2019 1141   K 4.1 04/14/2019 1141   CL 97 04/14/2019 1141   CO2 25 04/14/2019 1141   GLUCOSE 170 (H) 04/14/2019 1141   GLUCOSE 184 (H) 06/23/2016 1301   BUN 11 04/14/2019 1141   CREATININE 1.21 04/14/2019 1141   CALCIUM 9.6 04/14/2019 1141   PROT 7.5 04/14/2019 1141   ALBUMIN 4.6 04/14/2019 1141   AST 17 04/14/2019 1141   AST 31 12/15/2016 1418   ALT 18 04/14/2019 1141   ALT  35 12/15/2016 1418   ALKPHOS 95 04/14/2019 1141   BILITOT 0.3 04/14/2019 1141   GFRNONAA 69 04/14/2019 1141   GFRAA 80 04/14/2019 1141    Imaging Studies: Dg Chest 2 View  Result Date: 04/14/2019 CLINICAL DATA:  Shortness of breath, chest pain, and nausea for approximately 6 months. EXAM: CHEST - 2 VIEW COMPARISON:  09/05/2018 FINDINGS: The heart size and mediastinal contours are within normal limits. Both lungs are clear. The visualized skeletal structures are unremarkable. IMPRESSION: No active cardiopulmonary disease. Electronically Signed   By: Marlaine Hind M.D.   On: 04/14/2019 17:09    Assessment and  Plan:   Mike DrownDavid T Kline is a 48 y.o. y/o male has been referred for chest wall pain  Pain is reproducible on palpation Likely musculoskeletal however, patient is pending cardiology work-up due to old changes seen on EKG by PCP  After cardiology work-up is complete, if pain continues, can consider EGD  He is also due for screening colonoscopy However, cardiology clearance and work-up will need to be completed before any endoscopic procedures  Patient is doing well with Protonix once daily currently and may have some underlying reflux given that some of his symptoms get better after PPI  (Risks of PPI use were discussed with patient including bone loss, C. Diff diarrhea, pneumonia, infections, CKD, electrolyte abnormalities. Pt. Verbalizes understanding and chooses to continue the medication.)  Patient educated extensively on acid reflux lifestyle modification, including buying a bed wedge, not eating 3 hrs before bedtime, diet modifications, and handout given for the same.   Referred to general surgery for small abdominal wall hernia.  No pain present at the site.  If patient starts having pain at the site or increase in this growth, he was asked to go to the ER.    Dr Mike BouillonVarnita Alina Gilkey  Speech recognition software was used to dictate the above note.

## 2019-04-24 NOTE — Telephone Encounter (Signed)
Needs appt

## 2019-04-25 ENCOUNTER — Encounter: Payer: Self-pay | Admitting: Family Medicine

## 2019-04-26 ENCOUNTER — Other Ambulatory Visit: Payer: Self-pay | Admitting: Family Medicine

## 2019-04-27 MED ORDER — AMITRIPTYLINE HCL 50 MG PO TABS: 50.0000 mg | ORAL_TABLET | Freq: Every day | ORAL | 0 refills | Status: DC

## 2019-04-27 MED ORDER — SILDENAFIL CITRATE 100 MG PO TABS: 50.0000 mg | ORAL_TABLET | Freq: Every day | ORAL | 0 refills | Status: DC | PRN

## 2019-04-27 NOTE — Telephone Encounter (Signed)
Called patient, discussed d/c of prozac as it doesn't seem to be helping and seems to be causing side effects. Will increase elavil to help with moods, anxiety and sleep further. Will also add prn viagra for his worsening ED issues. Risks and precautions reviewed at length there. F/u in 1 month for recheck

## 2019-05-08 ENCOUNTER — Encounter: Payer: Self-pay | Admitting: Surgery

## 2019-05-08 ENCOUNTER — Other Ambulatory Visit: Payer: Self-pay

## 2019-05-08 ENCOUNTER — Ambulatory Visit: Payer: Medicare Other | Admitting: Surgery

## 2019-05-08 DIAGNOSIS — R1900 Intra-abdominal and pelvic swelling, mass and lump, unspecified site: Secondary | ICD-10-CM | POA: Diagnosis not present

## 2019-05-08 NOTE — Patient Instructions (Addendum)
CT scheduled 05/17/2019 @ 2:45 pm @ [facility]. Nothing to eat or drink 4 hours prior. Please pick up your prep kit today at   [facility].  Mike Kline [id number]  Please see your follow up appointment listed below.

## 2019-05-10 NOTE — Progress Notes (Signed)
Patient ID: Mike Kline, male   DOB: May 09, 1971, 48 y.o.   MRN: [id number]  HPI Mike Kline is a 48 y.o. male seen in consultation at the request of Dr. Maximino Greenland for a potential ventral hernia.  He does have significant history significant for diabetes and reports a new lump to the left of the abdominal wall that was seen on physical exam.  He reports some chronic abdominal pain and epigastric pain.  Pain is intermittent pain is not related to meals.  He does report that when he is having some physical activity pain seems to be worse. He also admits to having some chest discomfort and some reflux symptoms.  He reports having GERD for the last few weeks and helps with PPI.  No prior endoscopies. Given all this atypical chest pain symptoms he has been seen by cardiology who recently did a stress echo and the results are pending.  Of note he did have a history of appendectomy in the past but no other major abdominal operations.  He does inject insulin to his abdominal wall. He denies any fevers or any chills.   HPI  Past Medical History:  Diagnosis Date  . Adhesive capsulitis    permanent disability 07/2010, left shoulder  . Blindness of left eye   . Diabetes mellitus without complication (HCC)   . Diabetic, retinopathy, proliferative (HCC)   . GERD (gastroesophageal reflux disease)   . Hyperlipidemia   . Hypertension     Past Surgical History:  Procedure Laterality Date  . APPENDECTOMY    . CARPAL TUNNEL RELEASE Bilateral   . cataract surgery Bilateral 2014  . EYE SURGERY Left   . SHOULDER SURGERY Left 2009    Family History  Problem Relation Age of Onset  . Bone cancer Mother     Social History Social History   Tobacco Use  . Smoking status: Never Smoker  . Smokeless tobacco: Never Used  Substance Use Topics  . Alcohol use: Yes    Comment: socially  . Drug use: No    No Known Allergies  Current Outpatient Medications  Medication Sig Dispense Refill  . albuterol  (PROVENTIL HFA) 108 (90 Base) MCG/ACT inhaler Inhale 2 puffs into the lungs every 4 (four) hours as needed for wheezing or shortness of breath. 1 Inhaler 3  . amitriptyline (ELAVIL) 50 MG tablet Take 1 tablet (50 mg total) by mouth at bedtime. 30 tablet 0  . amLODipine (NORVASC) 5 MG tablet     . cetirizine (ZYRTEC) 10 MG tablet Take 1 tablet (10 mg total) by mouth daily. 30 tablet 11  . HUMALOG KWIKPEN 100 UNIT/ML KiwkPen USE THREE TIMES A DAY AS DIRECTED.(UP TO 50 UNITS A DAY OR AS DIRECTED BY SLIDING SCALE) 15 mL 6  . hydrochlorothiazide (HYDRODIURIL) 25 MG tablet     . Insulin Glargine (LANTUS SOLOSTAR) 100 UNIT/ML Solostar Pen Inject 60 Units into the skin daily at 10 pm. 45 mL 12  . losartan (COZAAR) 100 MG tablet     . pantoprazole (PROTONIX) 40 MG tablet Take 1 tablet (40 mg total) by mouth daily. 30 tablet 3  . pramipexole (MIRAPEX) 0.125 MG tablet Take 1 tablet (0.125 mg total) by mouth every evening. 90 tablet 1  . sildenafil (VIAGRA) 100 MG tablet Take 0.5-1 tablets (50-100 mg total) by mouth daily as needed for erectile dysfunction. 30 tablet 0  . traZODone (DESYREL) 50 MG tablet Take 0.5-1 tablets (25-50 mg total) by mouth at bedtime as needed  forsleep. 30 tablet 0  . sucralfate (CARAFATE) 1 g tablet Take 1 tablet (1 g total) by mouth 4 (four) times daily -  with meals and at bedtime. 120 tablet 0   No current facility-administered medications for this visit.      Review of Systems Full ROS  was asked and was negative except for the information on the HPI  Physical Exam Blood pressure (!) 149/84, pulse (!) 108, temperature 98.6 F (37 C), temperature source Temporal, height 5\' 7"  (1.702 m), weight 196 lb 3.2 oz (89 kg), SpO2 96 %. CONSTITUTIONAL: NAD EYES: Pupils are equal, round, and reactive to light, Sclera are non-icteric. EARS, NOSE, MOUTH AND THROAT: The oropharynx is clear. The oral mucosa is pink and moist. Hearing is intact to voice. LYMPH NODES:  Lymph nodes in the  neck are normal. RESPIRATORY:  Lungs are clear. There is normal respiratory effort, with equal breath sounds bilaterally, and without pathologic use of accessory muscles. CARDIOVASCULAR: Heart is regular without murmurs, gallops, or rubs. GI: The abdomen is  soft, nontender, and nondistended. There are no palpable masses. There is no hepatosplenomegaly. There are normal bowel sounds in all quadrants. GU: Rectal deferred.   MUSCULOSKELETAL: Normal muscle strength and tone. No cyanosis or edema.   SKIN: Turgor is good and there are no pathologic skin lesions or ulcers. NEUROLOGIC: Motor and sensation is grossly normal. Cranial nerves are grossly intact. PSYCH:  Oriented to person, place and time. Affect is normal.  Data Reviewed  I have personally reviewed the patient's imaging, laboratory findings and medical records.    Assessment/Plan  48 year old male with abdominal pain left lower quadrant abdominal wall mass.  On physical exam seems to be soft tissue mass from abdominal wall but I cannot definitively rule out a ventral hernia.  For those reasons I will obtain a CT scan of the abdomen and pelvis with contrast.  There is no need for emergent surgical intervention at this time.  CT scan will also allow [place] to assess a potential hiatal hernia.  We will also await on stress test recently performed  A copy of this report was sent to the referring provider.    Caroleen Hamman, MD FACS General Surgeon 05/10/2019, 10:58 AM

## 2019-05-17 ENCOUNTER — Ambulatory Visit
Admission: RE | Admit: 2019-05-17 | Discharge: 2019-05-17 | Disposition: A | Discharge status: Home / Self Care | Payer: Medicare Other | Source: Ambulatory Visit | Attending: Surgery | Admitting: Surgery

## 2019-05-17 ENCOUNTER — Other Ambulatory Visit: Payer: Self-pay

## 2019-05-17 DIAGNOSIS — R1900 Intra-abdominal and pelvic swelling, mass and lump, unspecified site: Secondary | ICD-10-CM | POA: Insufficient documentation

## 2019-05-17 MED ORDER — IOHEXOL 300 MG/ML  SOLN
100.0000 mL | Freq: Once | INTRAMUSCULAR | Status: AC | PRN
  Administered 2019-05-17: 100 mL via INTRAVENOUS

## 2019-05-18 ENCOUNTER — Telehealth: Payer: Self-pay

## 2019-05-18 ENCOUNTER — Other Ambulatory Visit
Admission: RE | Admit: 2019-05-18 | Discharge: 2019-05-18 | Disposition: A | Discharge status: Home / Self Care | Payer: Medicare Other | Source: Ambulatory Visit | Attending: Internal Medicine | Admitting: Internal Medicine

## 2019-05-18 DIAGNOSIS — Z01812 Encounter for preprocedural laboratory examination: Secondary | ICD-10-CM | POA: Insufficient documentation

## 2019-05-18 DIAGNOSIS — Z20828 Contact with and (suspected) exposure to other viral communicable diseases: Secondary | ICD-10-CM | POA: Insufficient documentation

## 2019-05-18 LAB — SARS CORONAVIRUS 2 (TAT 6-24 HRS): SARS Coronavirus 2: NEGATIVE

## 2019-05-18 NOTE — Telephone Encounter (Signed)
Patient notified of CT results and reminded to keep appointment 05/29/19.

## 2019-05-22 ENCOUNTER — Ambulatory Visit: Payer: Self-pay | Admitting: Surgery

## 2019-05-22 ENCOUNTER — Other Ambulatory Visit: Payer: Self-pay

## 2019-05-22 ENCOUNTER — Encounter
Admission: RE | Disposition: A | Discharge status: Home / Self Care | Payer: Self-pay | Source: Home / Self Care | Attending: Internal Medicine

## 2019-05-22 ENCOUNTER — Encounter: Payer: Self-pay | Admitting: *Deleted

## 2019-05-22 ENCOUNTER — Ambulatory Visit
Admission: RE | Admit: 2019-05-22 | Discharge: 2019-05-22 | Disposition: A | Discharge status: Home / Self Care | Payer: Medicare Other | Attending: Internal Medicine | Admitting: Internal Medicine

## 2019-05-22 DIAGNOSIS — E782 Mixed hyperlipidemia: Secondary | ICD-10-CM | POA: Diagnosis not present

## 2019-05-22 DIAGNOSIS — E10319 Type 1 diabetes mellitus with unspecified diabetic retinopathy without macular edema: Secondary | ICD-10-CM | POA: Insufficient documentation

## 2019-05-22 DIAGNOSIS — E1042 Type 1 diabetes mellitus with diabetic polyneuropathy: Secondary | ICD-10-CM | POA: Diagnosis not present

## 2019-05-22 DIAGNOSIS — K219 Gastro-esophageal reflux disease without esophagitis: Secondary | ICD-10-CM | POA: Insufficient documentation

## 2019-05-22 DIAGNOSIS — I25110 Atherosclerotic heart disease of native coronary artery with unstable angina pectoris: Secondary | ICD-10-CM | POA: Diagnosis not present

## 2019-05-22 DIAGNOSIS — F329 Major depressive disorder, single episode, unspecified: Secondary | ICD-10-CM | POA: Diagnosis not present

## 2019-05-22 DIAGNOSIS — I200 Unstable angina: Secondary | ICD-10-CM | POA: Diagnosis present

## 2019-05-22 DIAGNOSIS — I10 Essential (primary) hypertension: Secondary | ICD-10-CM | POA: Diagnosis not present

## 2019-05-22 DIAGNOSIS — E669 Obesity, unspecified: Secondary | ICD-10-CM | POA: Diagnosis not present

## 2019-05-22 DIAGNOSIS — E7800 Pure hypercholesterolemia, unspecified: Secondary | ICD-10-CM | POA: Diagnosis not present

## 2019-05-22 DIAGNOSIS — Z6830 Body mass index (BMI) 30.0-30.9, adult: Secondary | ICD-10-CM | POA: Insufficient documentation

## 2019-05-22 DIAGNOSIS — Z79899 Other long term (current) drug therapy: Secondary | ICD-10-CM | POA: Diagnosis not present

## 2019-05-22 DIAGNOSIS — Z794 Long term (current) use of insulin: Secondary | ICD-10-CM | POA: Diagnosis not present

## 2019-05-22 DIAGNOSIS — Z7982 Long term (current) use of aspirin: Secondary | ICD-10-CM | POA: Diagnosis not present

## 2019-05-22 HISTORY — PX: LEFT HEART CATH AND CORONARY ANGIOGRAPHY: CATH118249

## 2019-05-22 HISTORY — DX: Angina pectoris, unspecified: I20.9

## 2019-05-22 LAB — GLUCOSE, CAPILLARY
Glucose-Capillary: 105 mg/dL — ABNORMAL HIGH (ref 70–99)
Glucose-Capillary: 104 mg/dL — ABNORMAL HIGH (ref 70–99)
Glucose-Capillary: 45 mg/dL — ABNORMAL LOW (ref 70–99)
Glucose-Capillary: 64 mg/dL — ABNORMAL LOW (ref 70–99)
Glucose-Capillary: 111 mg/dL — ABNORMAL HIGH (ref 70–99)

## 2019-05-22 SURGERY — LEFT HEART CATH AND CORONARY ANGIOGRAPHY
Anesthesia: Moderate Sedation | Laterality: Left

## 2019-05-22 MED ORDER — SODIUM CHLORIDE 0.9% FLUSH: 3.0000 mL | INTRAVENOUS | Status: DC | PRN

## 2019-05-22 MED ORDER — DEXTROSE-NACL 5-0.45 % IV SOLN
INTRAVENOUS | Status: DC
  Administered 2019-05-22: 15:00:00 via INTRAVENOUS

## 2019-05-22 MED ORDER — HEPARIN (PORCINE) IN NACL 1000-0.9 UT/500ML-% IV SOLN
INTRAVENOUS | Status: DC | PRN
  Administered 2019-05-22: 500 mL

## 2019-05-22 MED ORDER — SODIUM CHLORIDE 0.9 % WEIGHT BASED INFUSION
3.0000 mL/kg/h | INTRAVENOUS | Status: AC
  Administered 2019-05-22: 3 mL/kg/h via INTRAVENOUS

## 2019-05-22 MED ORDER — MIDAZOLAM HCL 2 MG/2ML IJ SOLN
INTRAMUSCULAR | Status: DC | PRN
  Administered 2019-05-22 (×2): 1 mg via INTRAVENOUS

## 2019-05-22 MED ORDER — ASPIRIN 81 MG PO CHEW
81.0000 mg | CHEWABLE_TABLET | ORAL | Status: AC
  Administered 2019-05-22: 81 mg via ORAL

## 2019-05-22 MED ORDER — DEXTROSE 50 % IV SOLN
25.0000 mL | Freq: Once | INTRAVENOUS | Status: AC
  Administered 2019-05-22: 25 mL via INTRAVENOUS

## 2019-05-22 MED ORDER — SODIUM CHLORIDE 0.9% FLUSH: 3.0000 mL | Freq: Two times a day (BID) | INTRAVENOUS | Status: DC

## 2019-05-22 MED ORDER — SODIUM CHLORIDE 0.9 % WEIGHT BASED INFUSION: 1.0000 mL/kg/h | INTRAVENOUS | Status: DC

## 2019-05-22 MED ORDER — DEXTROSE 50 % IV SOLN
INTRAVENOUS | Status: AC
  Filled 2019-05-22: qty 50

## 2019-05-22 MED ORDER — ACETAMINOPHEN 325 MG PO TABS: 650.0000 mg | ORAL_TABLET | ORAL | Status: DC | PRN

## 2019-05-22 MED ORDER — SODIUM CHLORIDE 0.9 % IV SOLN: 250.0000 mL | INTRAVENOUS | Status: DC | PRN

## 2019-05-22 MED ORDER — HYDRALAZINE HCL 20 MG/ML IJ SOLN: 10.0000 mg | INTRAMUSCULAR | Status: DC | PRN

## 2019-05-22 MED ORDER — ONDANSETRON HCL 4 MG/2ML IJ SOLN: 4.0000 mg | Freq: Four times a day (QID) | INTRAMUSCULAR | Status: DC | PRN

## 2019-05-22 MED ORDER — LABETALOL HCL 5 MG/ML IV SOLN: 10.0000 mg | INTRAVENOUS | Status: DC | PRN

## 2019-05-22 MED ORDER — MIDAZOLAM HCL 2 MG/2ML IJ SOLN
INTRAMUSCULAR | Status: AC
  Filled 2019-05-22: qty 2

## 2019-05-22 MED ORDER — HEPARIN (PORCINE) IN NACL 1000-0.9 UT/500ML-% IV SOLN
INTRAVENOUS | Status: AC
  Filled 2019-05-22: qty 1000

## 2019-05-22 MED ORDER — FENTANYL CITRATE (PF) 100 MCG/2ML IJ SOLN
INTRAMUSCULAR | Status: AC
  Filled 2019-05-22: qty 2

## 2019-05-22 MED ORDER — FENTANYL CITRATE (PF) 100 MCG/2ML IJ SOLN
INTRAMUSCULAR | Status: DC | PRN
  Administered 2019-05-22 (×2): 50 ug via INTRAVENOUS

## 2019-05-22 MED ORDER — IOHEXOL 300 MG/ML  SOLN
INTRAMUSCULAR | Status: DC | PRN
  Administered 2019-05-22: 100 mL via INTRA_ARTERIAL

## 2019-05-22 MED ORDER — ASPIRIN 81 MG PO CHEW
CHEWABLE_TABLET | ORAL | Status: AC
  Filled 2019-05-22: qty 1

## 2019-05-22 SURGICAL SUPPLY — 9 items
CATH INFINITI 5FR ANG PIGTAIL (CATHETERS) ×2 IMPLANT
CATH INFINITI 5FR JL4 (CATHETERS) ×2 IMPLANT
CATH INFINITI JR4 5F (CATHETERS) ×2 IMPLANT
DEVICE CLOSURE MYNXGRIP 5F (Vascular Products) ×2 IMPLANT
KIT MANI 3VAL PERCEP (MISCELLANEOUS) ×2 IMPLANT
NEEDLE PERC 18GX7CM (NEEDLE) ×2 IMPLANT
PACK CARDIAC CATH (CUSTOM PROCEDURE TRAY) ×2 IMPLANT
SHEATH AVANTI 5FR X 11CM (SHEATH) ×2 IMPLANT
WIRE GUIDERIGHT .035X150 (WIRE) ×2 IMPLANT

## 2019-05-22 NOTE — Discharge Instructions (Signed)

## 2019-05-23 ENCOUNTER — Encounter: Payer: Self-pay | Admitting: Internal Medicine

## 2019-05-23 DIAGNOSIS — I2510 Atherosclerotic heart disease of native coronary artery without angina pectoris: Secondary | ICD-10-CM | POA: Insufficient documentation

## 2019-05-25 ENCOUNTER — Other Ambulatory Visit: Payer: Self-pay | Admitting: Family Medicine

## 2019-05-25 NOTE — Telephone Encounter (Signed)
Request refill for amitriptyline 50 mg .  Pt needs PHQ-2 or PHQ-9 before refilling.

## 2019-05-29 ENCOUNTER — Ambulatory Visit: Payer: Self-pay | Admitting: Surgery

## 2019-06-02 HISTORY — PX: OTHER SURGICAL HISTORY: SHX169

## 2019-06-03 DIAGNOSIS — Z951 Presence of aortocoronary bypass graft: Secondary | ICD-10-CM | POA: Insufficient documentation

## 2019-06-07 DIAGNOSIS — N179 Acute kidney failure, unspecified: Secondary | ICD-10-CM | POA: Insufficient documentation

## 2019-06-07 DIAGNOSIS — D62 Acute posthemorrhagic anemia: Secondary | ICD-10-CM | POA: Insufficient documentation

## 2019-06-07 DIAGNOSIS — Z9889 Other specified postprocedural states: Secondary | ICD-10-CM | POA: Insufficient documentation

## 2019-06-07 DIAGNOSIS — R112 Nausea with vomiting, unspecified: Secondary | ICD-10-CM | POA: Insufficient documentation

## 2019-06-07 DIAGNOSIS — G8918 Other acute postprocedural pain: Secondary | ICD-10-CM | POA: Insufficient documentation

## 2019-06-07 DIAGNOSIS — R5381 Other malaise: Secondary | ICD-10-CM | POA: Insufficient documentation

## 2019-06-07 DIAGNOSIS — K567 Ileus, unspecified: Secondary | ICD-10-CM | POA: Insufficient documentation

## 2019-06-07 DIAGNOSIS — E871 Hypo-osmolality and hyponatremia: Secondary | ICD-10-CM | POA: Insufficient documentation

## 2019-06-11 ENCOUNTER — Other Ambulatory Visit: Payer: Self-pay | Admitting: Family Medicine

## 2019-06-14 MED ORDER — FLUTICASONE PROPIONATE 50 MCG/ACT NA SUSP
2.00 | NASAL | Status: DC
Start: 2019-06-12 — End: 2019-06-14

## 2019-06-14 MED ORDER — BAYER WOMENS 81-300 MG PO TABS
40.00 | ORAL_TABLET | ORAL | Status: DC
Start: 2019-06-13 — End: 2019-06-14

## 2019-06-14 MED ORDER — MELATONIN 3 MG PO TABS
6.00 | ORAL_TABLET | ORAL | Status: DC
Start: 2019-06-12 — End: 2019-06-14

## 2019-06-14 MED ORDER — OXYCODONE HCL 5 MG PO TABS
5.00 | ORAL_TABLET | ORAL | Status: DC
Start: ? — End: 2019-06-14

## 2019-06-14 MED ORDER — BISACODYL 10 MG RE SUPP
10.00 | RECTAL | Status: DC
Start: 2019-06-13 — End: 2019-06-14

## 2019-06-14 MED ORDER — GUAIFENESIN-DM 100-10 MG/5ML PO SYRP
10.00 | ORAL_SOLUTION | ORAL | Status: DC
Start: ? — End: 2019-06-14

## 2019-06-14 MED ORDER — LIDOCAINE HCL 1 % IJ SOLN
0.50 | INTRAMUSCULAR | Status: DC
Start: ? — End: 2019-06-14

## 2019-06-14 MED ORDER — GUAIFENESIN ER 600 MG PO TB12
600.00 | ORAL_TABLET | ORAL | Status: DC
Start: 2019-06-12 — End: 2019-06-14

## 2019-06-14 MED ORDER — DOXYCYCLINE MONOHYDRATE 100 MG PO TABS
100.00 | ORAL_TABLET | ORAL | Status: DC
Start: 2019-06-12 — End: 2019-06-14

## 2019-06-14 MED ORDER — AMITRIPTYLINE HCL 50 MG PO TABS
50.00 | ORAL_TABLET | ORAL | Status: DC
Start: 2019-06-12 — End: 2019-06-14

## 2019-06-14 MED ORDER — IPRATROPIUM-ALBUTEROL 0.5-2.5 (3) MG/3ML IN SOLN
3.00 | RESPIRATORY_TRACT | Status: DC
Start: ? — End: 2019-06-14

## 2019-06-14 MED ORDER — SALINE NASAL SPRAY 0.65 % NA SOLN
1.00 | NASAL | Status: DC
Start: ? — End: 2019-06-14

## 2019-06-14 MED ORDER — PRAMIPEXOLE DIHYDROCHLORIDE 0.125 MG PO TABS
0.13 | ORAL_TABLET | ORAL | Status: DC
Start: 2019-06-12 — End: 2019-06-14

## 2019-06-14 MED ORDER — INSULIN GLARGINE 100 UNIT/ML ~~LOC~~ SOLN
35.00 | SUBCUTANEOUS | Status: DC
Start: 2019-06-12 — End: 2019-06-14

## 2019-06-14 MED ORDER — SENNOSIDES-DOCUSATE SODIUM 8.6-50 MG PO TABS
2.00 | ORAL_TABLET | ORAL | Status: DC
Start: 2019-06-12 — End: 2019-06-14

## 2019-06-14 MED ORDER — INSULIN LISPRO 100 UNIT/ML ~~LOC~~ SOLN
10.00 | SUBCUTANEOUS | Status: DC
Start: 2019-06-13 — End: 2019-06-14

## 2019-06-14 MED ORDER — FUROSEMIDE 40 MG PO TABS
40.00 | ORAL_TABLET | ORAL | Status: DC
Start: 2019-06-13 — End: 2019-06-14

## 2019-06-14 MED ORDER — FEXOFENADINE HCL 60 MG PO TABS
30.00 | ORAL_TABLET | ORAL | Status: DC
Start: 2019-06-12 — End: 2019-06-14

## 2019-06-14 MED ORDER — ASPIRIN 81 MG PO CHEW
81.00 | CHEWABLE_TABLET | ORAL | Status: DC
Start: 2019-06-13 — End: 2019-06-14

## 2019-06-14 MED ORDER — ONDANSETRON HCL 4 MG/2ML IJ SOLN
4.00 | INTRAMUSCULAR | Status: DC
Start: ? — End: 2019-06-14

## 2019-06-14 MED ORDER — SODIUM CHLORIDE 3 % IN NEBU
4.00 | INHALATION_SOLUTION | RESPIRATORY_TRACT | Status: DC
Start: ? — End: 2019-06-14

## 2019-06-14 MED ORDER — INSULIN LISPRO 100 UNIT/ML ~~LOC~~ SOLN
10.00 | SUBCUTANEOUS | Status: DC
Start: 2019-06-12 — End: 2019-06-14

## 2019-06-14 MED ORDER — INSULIN LISPRO 100 UNIT/ML ~~LOC~~ SOLN
12.00 | SUBCUTANEOUS | Status: DC
Start: 2019-06-13 — End: 2019-06-14

## 2019-06-14 MED ORDER — SPIRONOLACTONE 25 MG PO TABS
25.00 | ORAL_TABLET | ORAL | Status: DC
Start: 2019-06-13 — End: 2019-06-14

## 2019-06-14 MED ORDER — BENICAR 20 MG PO TABS
12.50 | ORAL_TABLET | ORAL | Status: DC
Start: ? — End: 2019-06-14

## 2019-06-14 MED ORDER — GLUCAGON HCL RDNA (DIAGNOSTIC) 1 MG IJ SOLR
1.00 | INTRAMUSCULAR | Status: DC
Start: ? — End: 2019-06-14

## 2019-06-14 MED ORDER — LIDOCAINE 5 % EX PTCH
2.00 | MEDICATED_PATCH | CUTANEOUS | Status: DC
Start: 2019-06-12 — End: 2019-06-14

## 2019-06-14 MED ORDER — COMPOUND W FREEZE OFF EX AERO
0.00 | INHALATION_SPRAY | CUTANEOUS | Status: DC
Start: 2019-06-12 — End: 2019-06-14

## 2019-06-14 MED ORDER — DIOTAME PO
25.00 | ORAL | Status: DC
Start: 2019-06-12 — End: 2019-06-14

## 2019-06-14 MED ORDER — VICON FORTE PO CAPS
17.00 | ORAL_CAPSULE | ORAL | Status: DC
Start: 2019-06-12 — End: 2019-06-14

## 2019-06-19 ENCOUNTER — Inpatient Hospital Stay: Payer: Medicare Other | Admitting: Nurse Practitioner

## 2019-06-26 ENCOUNTER — Encounter: Payer: Self-pay | Admitting: Family Medicine

## 2019-06-26 ENCOUNTER — Telehealth: Payer: Self-pay | Admitting: Family Medicine

## 2019-06-26 ENCOUNTER — Other Ambulatory Visit: Payer: Self-pay | Admitting: Family Medicine

## 2019-06-26 NOTE — Telephone Encounter (Signed)
Patient notified that medication was sent in on 06/12/19.

## 2019-06-26 NOTE — Telephone Encounter (Signed)
Does the patient need an appointment?

## 2019-06-26 NOTE — Telephone Encounter (Signed)
Needs appt

## 2019-06-26 NOTE — Telephone Encounter (Signed)
Patient requesting call back from CMA to discuss refilling pain medication that was prescribed by hospital after surgery. Patient has contacted hospital for refill, they referred him to contact his PCP. Patient unable to come in for appointment.    Please advise.

## 2019-06-26 NOTE — Telephone Encounter (Signed)
Pt scheduled for tomorrow

## 2019-06-27 ENCOUNTER — Other Ambulatory Visit: Payer: Self-pay

## 2019-06-27 ENCOUNTER — Encounter: Payer: Self-pay | Admitting: Family Medicine

## 2019-06-27 ENCOUNTER — Ambulatory Visit (INDEPENDENT_AMBULATORY_CARE_PROVIDER_SITE_OTHER): Payer: Medicare Other | Admitting: Family Medicine

## 2019-06-27 VITALS — BP 120/71 | HR 96 | Temp 98.3°F | Wt 179.6 lb

## 2019-06-27 DIAGNOSIS — L7682 Other postprocedural complications of skin and subcutaneous tissue: Secondary | ICD-10-CM | POA: Diagnosis not present

## 2019-06-27 MED ORDER — GABAPENTIN 300 MG PO CAPS: 300.0000 mg | ORAL_CAPSULE | Freq: Three times a day (TID) | ORAL | 1 refills | Status: DC

## 2019-06-27 NOTE — Progress Notes (Signed)
BP 120/71 Comment: pt reported  Pulse 96   Temp 98.3 F (36.8 C) (Oral)   Wt 179 lb 9.6 oz (81.5 kg)   BMI 28.13 kg/m    Subjective:    Patient ID: Mike Kline, male    DOB: 11/15/1970, 48 y.o.   MRN: [id number]  HPI: Mike Kline is a 48 y.o. male  Chief Complaint  Patient presents with  . Pain    pt requesting pain medication refill    . This visit was completed via WebEx due to the restrictions of the COVID-19 pandemic. All issues as above were discussed and addressed. Physical exam was done as above through visual confirmation on WebEx. If it was felt that the patient should be evaluated in the office, they were directed there. The patient verbally consented to this visit. . Location of the patient: home . Location of the provider: home . Those involved with this call:  . Provider: Roosvelt Maser, PA-C . CMA: Wilhemena Durie, CMA . Front Desk/Registration: Harriet Pho  . Time spent on call: 15 minutes with patient face to face via video conference. More than 50% of this time was spent in counseling and coordination of care. 5 minutes total spent in review of patient's record and preparation of their chart. I verified patient identity using two factors (patient name and date of birth). Patient consents verbally to being seen via telemedicine visit today.   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 Overall feels his recovery is going well, but continues to have searing cutting pain at chest tube insertion site under arm that is mainly bothersome with coughing, sneezing, or rolling over. Had been on oxycodone tablets given by hospital for about 2 weeks after surgery as needed, which helped but he has run out. Currently taking tylenol prn which he states does not do much for it. Having trouble sleeping from the pain. Denies fever, chills, SOB, significant cough, drainage from incisions. Has f/u with Gen Surgery tomorrow for post  op wound check.  Relevant past medical, surgical, family and social history reviewed and updated as indicated. Interim medical history since our last visit reviewed. Allergies and medications reviewed and updated.  Review of Systems  Per HPI unless specifically indicated above     Objective:    BP 120/71 Comment: pt reported  Pulse 96   Temp 98.3 F (36.8 C) (Oral)   Wt 179 lb 9.6 oz (81.5 kg)   BMI 28.13 kg/m   Wt Readings from Last 3 Encounters:  06/27/19 179 lb 9.6 oz (81.5 kg)  05/22/19 195 lb (88.5 kg)  05/08/19 196 lb 3.2 oz (89 kg)    Physical Exam Vitals signs and nursing note reviewed.  Constitutional:      General: He is not in acute distress.    Appearance: Normal appearance.  HENT:     Head: Atraumatic.     Right Ear: External ear normal.     Left Ear: External ear normal.     Nose: Nose normal. No congestion.     Mouth/Throat:     Mouth: Mucous membranes are moist.     Pharynx: Oropharynx is clear. No posterior oropharyngeal erythema.  Eyes:     Extraocular Movements: Extraocular movements intact.     Conjunctiva/sclera: Conjunctivae normal.  Neck:     Musculoskeletal: Normal range of motion.  Cardiovascular:     Comments: Unable to assess via virtual visit Pulmonary:  Effort: Pulmonary effort is normal. No respiratory distress.  Musculoskeletal: Normal range of motion.  Skin:    General: Skin is dry.     Findings: No erythema.  Neurological:     Mental Status: He is alert and oriented to person, place, and time.  Psychiatric:        Mood and Affect: Mood normal.        Thought Content: Thought content normal.        Judgment: Judgment normal.     Results for orders placed or performed during the hospital encounter of 05/22/19  Glucose, capillary  Result Value Ref Range   Glucose-Capillary 105 (H) 70 - 99 mg/dL  Glucose, capillary  Result Value Ref Range   Glucose-Capillary 45 (L) 70 - 99 mg/dL  Glucose, capillary  Result Value Ref  Range   Glucose-Capillary 104 (H) 70 - 99 mg/dL  Glucose, capillary  Result Value Ref Range   Glucose-Capillary 64 (L) 70 - 99 mg/dL  Glucose, capillary  Result Value Ref Range   Glucose-Capillary 111 (H) 70 - 99 mg/dL      Assessment & Plan:   Problem List Items Addressed This Visit    None    Visit Diagnoses    Pain at surgical incision    -  Primary   Sounds neuropathic, start gabapentin prn in addition to OTC pain relievers. No evidence of infection but await in person Gen Surgery f/u tomorrow       Follow up plan: Return if symptoms worsen or fail to improve.

## 2019-07-09 ENCOUNTER — Other Ambulatory Visit: Payer: Self-pay | Admitting: Family Medicine

## 2019-07-10 MED ORDER — TRAZODONE HCL 50 MG PO TABS: 50.0000 mg | ORAL_TABLET | Freq: Every evening | ORAL | 0 refills | Status: DC | PRN

## 2019-07-11 ENCOUNTER — Other Ambulatory Visit: Payer: Self-pay | Admitting: Family Medicine

## 2019-07-20 ENCOUNTER — Ambulatory Visit: Payer: Medicare Other | Admitting: Gastroenterology

## 2019-07-20 ENCOUNTER — Encounter: Payer: Self-pay | Admitting: Family Medicine

## 2019-07-24 ENCOUNTER — Other Ambulatory Visit: Payer: Self-pay | Admitting: Family Medicine

## 2019-07-24 ENCOUNTER — Encounter: Payer: Self-pay | Admitting: Family Medicine

## 2019-07-24 ENCOUNTER — Ambulatory Visit (INDEPENDENT_AMBULATORY_CARE_PROVIDER_SITE_OTHER): Payer: Medicare Other | Admitting: Family Medicine

## 2019-07-24 ENCOUNTER — Inpatient Hospital Stay: Payer: Medicare Other | Admitting: Nurse Practitioner

## 2019-07-24 ENCOUNTER — Other Ambulatory Visit: Payer: Self-pay

## 2019-07-24 VITALS — BP 139/76 | HR 84 | Temp 97.4°F | Ht 67.0 in | Wt 178.0 lb

## 2019-07-24 DIAGNOSIS — R059 Cough, unspecified: Secondary | ICD-10-CM

## 2019-07-24 DIAGNOSIS — R05 Cough: Secondary | ICD-10-CM | POA: Diagnosis not present

## 2019-07-24 MED ORDER — BENZONATATE 200 MG PO CAPS: 200.0000 mg | ORAL_CAPSULE | Freq: Three times a day (TID) | ORAL | 0 refills | Status: DC | PRN

## 2019-07-24 MED ORDER — AMITRIPTYLINE HCL 50 MG PO TABS: 50.0000 mg | ORAL_TABLET | Freq: Every day | ORAL | 1 refills | Status: DC

## 2019-07-24 MED ORDER — HYDROCOD POLST-CPM POLST ER 10-8 MG/5ML PO SUER: 5.0000 mL | Freq: Two times a day (BID) | ORAL | 0 refills | Status: DC | PRN

## 2019-07-24 NOTE — Telephone Encounter (Signed)
Routing to provider  

## 2019-07-24 NOTE — Progress Notes (Signed)
BP 139/76   Pulse 84   Temp (!) 97.4 F (36.3 C) (Oral)   Ht 5\' 7"  (1.702 m)   Wt 178 lb (80.7 kg)   BMI 27.88 kg/m    Subjective:    Patient ID: Mike Kline, male    DOB: August 05, 1971, 48 y.o.   MRN: [id number]  HPI: Mike Kline is a 48 y.o. male  Chief Complaint  Patient presents with  . Cough    . This visit was completed via WebEx due to the restrictions of the COVID-19 pandemic. All issues as above were discussed and addressed. Physical exam was done as above through visual confirmation on WebEx. If it was felt that the patient should be evaluated in the office, they were directed there. The patient verbally consented to this visit. . Location of the patient: home . Location of the provider: work . Those involved with this call:  . Provider: Merrie Roof, PA-C . CMA: Lesle Chris, [place] . Front Desk/Registration: Jill Side  . Time spent on call: 25 minutes with patient face to face via video conference. More than 50% of this time was spent in counseling and coordination of care. 10 minutes total spent in review of patient's record and preparation of their chart. I verified patient identity using two factors (patient name and date of birth). Patient consents verbally to being seen via telemedicine visit today.   Dealing with a cough for about a week now. Dry, mild but very nervous about it due to his recent CABG last month. States his incision is healing very well and he's not having severe pain with coughing. Denies wheezing, SOB, fevers, sore throat, congestion, sick contacts. Not taking anything for cough.   Relevant past medical, surgical, family and social history reviewed and updated as indicated. Interim medical history since our last visit reviewed. Allergies and medications reviewed and updated.  Review of Systems  Per HPI unless specifically indicated above     Objective:    BP 139/76   Pulse 84   Temp (!) 97.4 F (36.3 C) (Oral)   Ht 5\' 7"  (1.702 m)    Wt 178 lb (80.7 kg)   BMI 27.88 kg/m   Wt Readings from Last 3 Encounters:  07/24/19 178 lb (80.7 kg)  06/27/19 179 lb 9.6 oz (81.5 kg)  05/22/19 195 lb (88.5 kg)    Physical Exam Vitals and nursing note reviewed.  Constitutional:      General: He is not in acute distress.    Appearance: Normal appearance.  HENT:     Head: Atraumatic.     Right Ear: External ear normal.     Left Ear: External ear normal.     Nose: Nose normal. No congestion.     Mouth/Throat:     Mouth: Mucous membranes are moist.     Pharynx: Oropharynx is clear.  Eyes:     Extraocular Movements: Extraocular movements intact.     Conjunctiva/sclera: Conjunctivae normal.  Pulmonary:     Effort: Pulmonary effort is normal. No respiratory distress.  Musculoskeletal:        General: Normal range of motion.     Cervical back: Normal range of motion.  Skin:    General: Skin is dry.     Findings: No erythema or rash.  Neurological:     Mental Status: He is oriented to person, place, and time.  Psychiatric:        Mood and Affect: Mood normal.  Thought Content: Thought content normal.        Judgment: Judgment normal.     Results for orders placed or performed during the hospital encounter of 05/22/19  Glucose, capillary  Result Value Ref Range   Glucose-Capillary 105 (H) 70 - 99 mg/dL  Glucose, capillary  Result Value Ref Range   Glucose-Capillary 45 (L) 70 - 99 mg/dL  Glucose, capillary  Result Value Ref Range   Glucose-Capillary 104 (H) 70 - 99 mg/dL  Glucose, capillary  Result Value Ref Range   Glucose-Capillary 64 (L) 70 - 99 mg/dL  Glucose, capillary  Result Value Ref Range   Glucose-Capillary 111 (H) 70 - 99 mg/dL      Assessment & Plan:   Problem List Items Addressed This Visit    None    Visit Diagnoses    Cough    -  Primary   Declines COVID testing. Will isolate until sxs improve, tessalon and tussionex sent and discussed supportive care. F/u if not improving        Follow up plan: Return for as scheduled.

## 2019-07-26 ENCOUNTER — Encounter: Payer: Self-pay | Admitting: Family Medicine

## 2019-07-27 MED ORDER — GUAIFENESIN-CODEINE 100-10 MG/5ML PO SOLN: 5.0000 mL | Freq: Two times a day (BID) | ORAL | 0 refills | Status: DC | PRN

## 2019-07-27 NOTE — Telephone Encounter (Signed)
Needs appt for the leg (OK to work in tomorrow and ok for virtual) since this is a new problem and needs to be documented and discussed.   Will send over a different cough syrup to see if better covered

## 2019-07-28 ENCOUNTER — Ambulatory Visit (INDEPENDENT_AMBULATORY_CARE_PROVIDER_SITE_OTHER): Payer: Medicare Other | Admitting: Family Medicine

## 2019-07-28 ENCOUNTER — Other Ambulatory Visit: Payer: Self-pay

## 2019-07-28 ENCOUNTER — Encounter: Payer: Self-pay | Admitting: Family Medicine

## 2019-07-28 VITALS — Temp 97.1°F | Ht 67.0 in | Wt 180.0 lb

## 2019-07-28 DIAGNOSIS — L03116 Cellulitis of left lower limb: Secondary | ICD-10-CM

## 2019-07-28 MED ORDER — DOXYCYCLINE HYCLATE 100 MG PO TABS: 100.0000 mg | ORAL_TABLET | Freq: Two times a day (BID) | ORAL | 0 refills | Status: DC

## 2019-07-28 NOTE — Progress Notes (Signed)
Temp (!) 97.1 F (36.2 C) (Oral)   Ht 5\' 7"  (1.702 m)   Wt 180 lb (81.6 kg)   BMI 28.19 kg/m    Subjective:    Patient ID: Mike Kline, male    DOB: 07-Oct-1970, 48 y.o.   MRN: [id number]  HPI: Mike Kline is a 48 y.o. male  Chief Complaint  Patient presents with  . Wound Check    pt states he had a surgery on his left leg behind the kneed and he thinks it is infected, rednees and seems wound is open, per patient.    . This visit was completed via WebEx due to the restrictions of the COVID-19 pandemic. All issues as above were discussed and addressed. Physical exam was done as above through visual confirmation on WebEx. If it was felt that the patient should be evaluated in the office, they were directed there. The patient verbally consented to this visit. . Location of the patient: home . Location of the provider: work . Those involved with this call:  . Provider: Merrie Roof, PA-C . CMA: Lesle Chris, [place] . Front Desk/Registration: Jill Side  . Time spent on call: 15 minutes with patient face to face via video conference. More than 50% of this time was spent in counseling and coordination of care. 5 minutes total spent in review of patient's record and preparation of their chart. I verified patient identity using two factors (patient name and date of birth). Patient consents verbally to being seen via telemedicine visit today.   Patient presenting today with concern for infection in his CABG graft incision lower left leg. Noticed the past few days some redness extending down the leg incision edema, erythema, and discharge. Having some increased pain but still under good control without pain medication. Denies fever, chills, sweats, body aches. Using neosporin and keeping a bandage over the incision but not noticing much improvement with that.   Relevant past medical, surgical, family and social history reviewed and updated as indicated. Interim medical history since our last  visit reviewed. Allergies and medications reviewed and updated.  Review of Systems  Per HPI unless specifically indicated above     Objective:    Temp (!) 97.1 F (36.2 C) (Oral)   Ht 5\' 7"  (1.702 m)   Wt 180 lb (81.6 kg)   BMI 28.19 kg/m   Wt Readings from Last 3 Encounters:  07/28/19 180 lb (81.6 kg)  07/24/19 178 lb (80.7 kg)  06/27/19 179 lb 9.6 oz (81.5 kg)    Physical Exam Vitals and nursing note reviewed.  Constitutional:      General: He is not in acute distress.    Appearance: Normal appearance.  HENT:     Head: Atraumatic.     Right Ear: External ear normal.     Left Ear: External ear normal.     Nose: Nose normal. No congestion.     Mouth/Throat:     Mouth: Mucous membranes are moist.     Pharynx: Oropharynx is clear. No posterior oropharyngeal erythema.  Eyes:     Extraocular Movements: Extraocular movements intact.     Conjunctiva/sclera: Conjunctivae normal.  Cardiovascular:     Comments: Unable to assess via virtual visit Pulmonary:     Effort: Pulmonary effort is normal. No respiratory distress.  Musculoskeletal:        General: Normal range of motion.     Cervical back: Normal range of motion.  Skin:    General: Skin  is dry.     Findings: Erythema (extending down left lower leg below graft incision) present.     Comments: Incision left lower leg erythematous and edematous, mild drainage present  Neurological:     Mental Status: He is alert and oriented to person, place, and time.  Psychiatric:        Mood and Affect: Mood normal.        Thought Content: Thought content normal.        Judgment: Judgment normal.     Results for orders placed or performed during the hospital encounter of 05/22/19  Glucose, capillary  Result Value Ref Range   Glucose-Capillary 105 (H) 70 - 99 mg/dL  Glucose, capillary  Result Value Ref Range   Glucose-Capillary 45 (L) 70 - 99 mg/dL  Glucose, capillary  Result Value Ref Range   Glucose-Capillary 104 (H) 70  - 99 mg/dL  Glucose, capillary  Result Value Ref Range   Glucose-Capillary 64 (L) 70 - 99 mg/dL  Glucose, capillary  Result Value Ref Range   Glucose-Capillary 111 (H) 70 - 99 mg/dL      Assessment & Plan:   Problem List Items Addressed This Visit    None    Visit Diagnoses    Cellulitis of left lower extremity    -  Primary   Possibly early stage of left leg cellulitis from graft incision. Doxycycline sent, continue warm compresses, neosporin, bandaging. Elevate legs at rest       Follow up plan: Return in about 1 week (around 08/04/2019) for Wound check, can be after christmas.

## 2019-08-07 ENCOUNTER — Encounter: Payer: Self-pay | Admitting: Family Medicine

## 2019-08-08 ENCOUNTER — Other Ambulatory Visit: Payer: Self-pay | Admitting: Family Medicine

## 2019-08-08 MED ORDER — HYDROCOD POLST-CPM POLST ER 10-8 MG/5ML PO SUER: 5.0000 mL | Freq: Two times a day (BID) | ORAL | 0 refills | Status: DC | PRN

## 2019-08-08 MED ORDER — BENZONATATE 200 MG PO CAPS: 200.0000 mg | ORAL_CAPSULE | Freq: Three times a day (TID) | ORAL | 0 refills | Status: DC | PRN

## 2019-09-03 ENCOUNTER — Encounter: Payer: Self-pay | Admitting: Family Medicine

## 2019-09-04 ENCOUNTER — Other Ambulatory Visit: Payer: Self-pay | Admitting: Family Medicine

## 2019-09-04 ENCOUNTER — Other Ambulatory Visit: Payer: Self-pay

## 2019-09-04 MED ORDER — PRAMIPEXOLE DIHYDROCHLORIDE 0.125 MG PO TABS: 0.1250 mg | ORAL_TABLET | Freq: Every evening | ORAL | 1 refills | Status: DC

## 2019-09-04 MED ORDER — GABAPENTIN 300 MG PO CAPS: 300.0000 mg | ORAL_CAPSULE | Freq: Three times a day (TID) | ORAL | 1 refills | Status: DC

## 2019-09-04 NOTE — Telephone Encounter (Signed)
Refill request for pramipexole.  LOV 07/28/2019

## 2019-09-13 DIAGNOSIS — I2510 Atherosclerotic heart disease of native coronary artery without angina pectoris: Secondary | ICD-10-CM | POA: Insufficient documentation

## 2019-10-11 ENCOUNTER — Other Ambulatory Visit: Payer: Self-pay

## 2019-10-11 NOTE — Telephone Encounter (Signed)
LOV: 07/28/2019 with Roosvelt Maser, PA-C.

## 2019-10-23 ENCOUNTER — Other Ambulatory Visit: Payer: Self-pay | Admitting: Family Medicine

## 2019-11-30 ENCOUNTER — Other Ambulatory Visit: Payer: Self-pay | Admitting: Family Medicine

## 2019-11-30 ENCOUNTER — Encounter: Payer: Self-pay | Admitting: Family Medicine

## 2019-11-30 MED ORDER — GABAPENTIN 300 MG PO CAPS: 300.0000 mg | ORAL_CAPSULE | Freq: Three times a day (TID) | ORAL | 5 refills | Status: DC

## 2019-12-01 ENCOUNTER — Other Ambulatory Visit: Payer: Self-pay | Admitting: Family Medicine

## 2019-12-01 MED ORDER — AMITRIPTYLINE HCL 100 MG PO TABS: 100.0000 mg | ORAL_TABLET | Freq: Every day | ORAL | 1 refills | Status: DC

## 2020-02-14 ENCOUNTER — Telehealth: Payer: Self-pay | Admitting: Family Medicine

## 2020-02-14 NOTE — Telephone Encounter (Signed)
Copied from CRM #[id number]. Topic: Medicare AWV >> Feb 14, 2020  9:19 AM Claudette Laws R wrote: Reason for CRM:  Left message for patient to call back and schedule Medicare Annual Wellness Visit (AWV) to be done virtually.  No hx of AWVI per [facility]  as of 12/09/2011  Please schedule at anytime with [facility] Health Advisor.      45 Minutes appointment

## 2020-03-01 ENCOUNTER — Other Ambulatory Visit: Payer: Self-pay | Admitting: Family Medicine

## 2020-03-01 ENCOUNTER — Encounter: Payer: Self-pay | Admitting: Family Medicine

## 2020-03-01 NOTE — Telephone Encounter (Signed)
Requested Prescriptions  Pending Prescriptions Disp Refills  . pantoprazole (PROTONIX) 40 MG tablet [Pharmacy Med Name: PANTOPRAZOLE SOD DR 40 MG TAB] 90 tablet 0    Sig: Take 1 tablet (40 mg total) by mouth daily.     Gastroenterology: Proton Pump Inhibitors Passed - 03/01/2020 10:37 AM      Passed - Valid encounter within last 12 months    Recent Outpatient Visits          7 months ago Cellulitis of left lower extremity   [facility] Particia Nearing, [place]   7 months ago Cough   [facility] Roosvelt Maser [place], [place]   8 months ago Pain at surgical incision   [facility] Particia [place], [place]   10 months ago Epigastric pain   [facility] Roosvelt Maser [place], [place]   1 year ago Bronchitis   [facility] Maurice March, [place], [place]             . pramipexole (MIRAPEX) 0.125 MG tablet [Pharmacy Med Name: PRAMIPEXOLE 0.125 MG TABLET] 90 tablet 0    Sig: Take 1 tablet (0.125 mg total) by mouth every evening.     Neurology:  Parkinsonian Agents Passed - 03/01/2020 10:37 AM      Passed - Last BP in normal range    BP Readings from Last 1 Encounters:  07/24/19 139/76         Passed - Valid encounter within last 12 months    Recent Outpatient Visits          7 months ago Cellulitis of left lower extremity   [facility] Particia N[place], [place]   7 months ago Cough   [facility] Roosvelt Maser [place], [place]   8 months ago Pain at surgical incision   [facility] Particia N[place], [place]   10 months ago Epigastric pain   [facility] Roosvelt Maser [place], [place]   1 year ago Bronchitis   [facility] Roosvelt Maser [place], [place]

## 2020-03-03 ENCOUNTER — Other Ambulatory Visit: Payer: Self-pay | Admitting: Family Medicine

## 2020-05-09 ENCOUNTER — Telehealth: Payer: Self-pay | Admitting: Family Medicine

## 2020-05-09 NOTE — Telephone Encounter (Signed)
Copied from CRM [id number]. Topic: Medicare AWV >> May 09, 2020 10:18 AM Claudette Laws R wrote: Reason for CRM: Left message for patient to call back and schedule Medicare Annual Wellness Visit (AWV) to be done virtually.  No hx of AWV  Please schedule at anytime with [facility] Health Advisor.      45 Minutes appointment   Any questions, please call me at [phone]

## 2020-05-10 ENCOUNTER — Other Ambulatory Visit: Payer: Self-pay | Admitting: Family Medicine

## 2020-05-18 ENCOUNTER — Emergency Department
Admission: EM | Admit: 2020-05-18 | Discharge: 2020-05-18 | Disposition: A | Discharge status: Home / Self Care | Payer: Medicare Other | Attending: Emergency Medicine | Admitting: Emergency Medicine

## 2020-05-18 ENCOUNTER — Encounter: Payer: Self-pay | Admitting: Emergency Medicine

## 2020-05-18 ENCOUNTER — Other Ambulatory Visit: Payer: Self-pay

## 2020-05-18 DIAGNOSIS — Z20822 Contact with and (suspected) exposure to covid-19: Secondary | ICD-10-CM | POA: Insufficient documentation

## 2020-05-18 DIAGNOSIS — Z794 Long term (current) use of insulin: Secondary | ICD-10-CM | POA: Diagnosis not present

## 2020-05-18 DIAGNOSIS — I10 Essential (primary) hypertension: Secondary | ICD-10-CM | POA: Insufficient documentation

## 2020-05-18 DIAGNOSIS — Z79899 Other long term (current) drug therapy: Secondary | ICD-10-CM | POA: Diagnosis not present

## 2020-05-18 DIAGNOSIS — R059 Cough, unspecified: Secondary | ICD-10-CM | POA: Diagnosis present

## 2020-05-18 DIAGNOSIS — J069 Acute upper respiratory infection, unspecified: Secondary | ICD-10-CM

## 2020-05-18 DIAGNOSIS — E1051 Type 1 diabetes mellitus with diabetic peripheral angiopathy without gangrene: Secondary | ICD-10-CM | POA: Insufficient documentation

## 2020-05-18 LAB — RESPIRATORY PANEL BY RT PCR (FLU A&B, COVID)
Influenza A by PCR: NEGATIVE
Influenza B by PCR: NEGATIVE
SARS Coronavirus 2 by RT PCR: NEGATIVE

## 2020-05-18 LAB — CBC
HCT: 39.6 % (ref 39.0–52.0)
Hemoglobin: 13.1 g/dL (ref 13.0–17.0)
MCH: 26.5 pg (ref 26.0–34.0)
MCHC: 33.1 g/dL (ref 30.0–36.0)
MCV: 80 fL (ref 80.0–100.0)
Platelets: 257 10*3/uL (ref 150–400)
RBC: 4.95 MIL/uL (ref 4.22–5.81)
RDW: 13.7 % (ref 11.5–15.5)
WBC: 8.4 10*3/uL (ref 4.0–10.5)
nRBC: 0 % (ref 0.0–0.2)

## 2020-05-18 LAB — TROPONIN I (HIGH SENSITIVITY)
Troponin I (High Sensitivity): 5 ng/L (ref <18)
Troponin I (High Sensitivity): 5 ng/L (ref <18)

## 2020-05-18 LAB — BASIC METABOLIC PANEL
Anion gap: 8 (ref 5–15)
BUN: 12 mg/dL (ref 6–20)
CO2: 28 mmol/L (ref 22–32)
Calcium: 9 mg/dL (ref 8.9–10.3)
Chloride: 101 mmol/L (ref 98–111)
Creatinine, Ser: 1.16 mg/dL (ref 0.61–1.24)
GFR, Estimated: >60 mL/min (ref >60)
Glucose, Bld: 204 mg/dL — ABNORMAL HIGH (ref 70–99)
Potassium: 3.9 mmol/L (ref 3.5–5.1)
Sodium: 137 mmol/L (ref 135–145)

## 2020-05-18 MED ORDER — GUAIFENESIN-CODEINE 100-10 MG/5ML PO SOLN: 5.0000 mL | Freq: Four times a day (QID) | ORAL | 0 refills | Status: DC | PRN

## 2020-05-18 MED ORDER — AZITHROMYCIN 250 MG PO TABS: ORAL_TABLET | ORAL | 0 refills | Status: DC

## 2020-05-18 MED ORDER — AZITHROMYCIN 250 MG PO TABS: ORAL_TABLET | ORAL | 0 refills | Status: AC

## 2020-05-18 NOTE — ED Triage Notes (Addendum)
Pt arrived via POV from [facility] with reports of cough, headache and pain in chest from cough. Pt is type 1 diabetic. Pt does not have an insulin pump on.  Pt states [facility] referred him to the ED because he has fluid around his heart.  Pt had CXR at [facility] with impression: central pulmonary venous congestion without overt pulmonary edema.   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

## 2020-05-18 NOTE — Discharge Instructions (Addendum)
Please seek medical attention for any high fevers, chest pain, shortness of breath, change in behavior, persistent vomiting, bloody stool or any other new or concerning symptoms.  

## 2020-05-18 NOTE — ED Provider Notes (Signed)
[facility] Emergency Department Provider Note   ____________________________________________   I have reviewed the triage vital signs and the nursing notes.   HISTORY  Chief Complaint Cough and Headache   History limited by: Not Limited   HPI Mike Kline is a 49 y.o. male who presents to the emergency department today because of concerns for cough, sore throat.  Patient states that sore throat started roughly 4 days ago.  His son did have a sore throat prior to the patient coming down with it.  Then developed a productive cough.  States for the past 2 days cough is been persistent.  He has tried taking both over the counter pain medication as well as Lawyer without any relief.  Also has an albuterol inhaler which she has tried without any significant relief.  Patient states the cough is kept him up at night.  Patient is also had associated headache.  Located primarily on the right side.   Records reviewed. Per medical record review patient has a history of DM, HLD, HTN.   Past Medical History:  Diagnosis Date  . Adhesive capsulitis    permanent disability 07/2010, left shoulder  . Anginal pain (HCC)   . Blindness of left eye   . Diabetes mellitus without complication (HCC)   . Diabetic, retinopathy, proliferative (HCC)   . GERD (gastroesophageal reflux disease)   . Hyperlipidemia   . Hypertension     Patient Active Problem List   Diagnosis Date Noted  . Anxiety 04/14/2019  . Insomnia 05/28/2016  . Adhesive capsulitis 12/17/2015  . Hypercholesterolemia 10/22/2015  . Diabetic, retinopathy, proliferative (HCC) 05/28/2015  . Diabetes mellitus type 1 with peripheral artery disease (HCC) 05/28/2015  . Hypertension   . Type I diabetes mellitus with manifestations, uncontrolled (HCC)   . GERD (gastroesophageal reflux disease)   . Nuclear sclerotic cataract 07/05/2013  . Type 1 diabetes mellitus (HCC) 05/29/2013    Past Surgical History:   Procedure Laterality Date  . APPENDECTOMY    . CARPAL TUNNEL RELEASE Bilateral   . cataract surgery Bilateral 2014  . EYE SURGERY Left   . LEFT HEART CATH AND CORONARY ANGIOGRAPHY Left 05/22/2019   Procedure: LEFT HEART CATH AND CORONARY ANGIOGRAPHY;  Surgeon: Alwyn Pea, MD;  Location: [facility] INVASIVE CV LAB;  Service: Cardiovascular;  Laterality: Left;  . SHOULDER SURGERY Left 2009    Prior to Admission medications   Medication Sig Start Date End Date Taking? Authorizing Provider  albuterol (PROVENTIL HFA) 108 (90 Base) MCG/ACT inhaler Inhale 2 puffs into the lungs every 4 (four) hours as needed for wheezing or shortness of breath. 09/13/18   Particia Nearing, PA-C  amitriptyline (ELAVIL) 100 MG tablet Take 1 tablet (100 mg total) by mouth at bedtime. 12/01/19   Particia Nearing, PA-C  benzonatate (TESSALON) 200 MG capsule Take 1 capsule (200 mg total) by mouth 3 (three) times daily as needed. 08/08/19   Particia Nearing, PA-C  chlorpheniramine-HYDROcodone [facility] ER) 10-8 MG/5ML SUER Take 5 mLs by mouth every 12 (twelve) hours as needed. 08/08/19   Particia Nearing, PA-C  doxycycline (VIBRA-TABS) 100 MG tablet Take 1 tablet (100 mg total) by mouth 2 (two) times daily. 07/28/19   Particia Nearing, PA-C  gabapentin (NEURONTIN) 300 MG capsule Take 1 capsule (300 mg total) by mouth 3 (three) times daily. 11/30/19   Particia Nearing, PA-C  guaiFENesin-codeine 100-10 MG/5ML syrup Take 5 mLs by mouth 2 (two) times daily  as needed for cough. 07/27/19   Particia Nearing, PA-C  HUMALOG KWIKPEN 100 UNIT/ML KiwkPen USE THREE TIMES A DAY AS DIRECTED.(UP TO 50 UNITS A DAY OR AS DIRECTED BY SLIDING SCALE) Patient taking differently: Inject 6-10 Units into the skin 3 (three) times daily.  12/30/15   Steele Sizer, MD  Insulin Glargine (LANTUS SOLOSTAR) 100 UNIT/ML Solostar Pen Inject 60 Units into the skin daily at 10 pm. Patient taking  differently: Inject 40 Units into the skin daily at 10 pm.  01/07/16   Steele Sizer, MD  metoprolol tartrate (LOPRESSOR) 25 MG tablet Take 25 mg by mouth 2 (two) times daily. 06/12/19   [provider]  pantoprazole (PROTONIX) 40 MG tablet Take 1 tablet (40 mg total) by mouth daily. 03/01/20   Particia Nearing, PA-C  pramipexole (MIRAPEX) 0.125 MG tablet Take 1 tablet (0.125 mg total) by mouth every evening. 05/10/20   Valentino Nose, NP  sildenafil (VIAGRA) 100 MG tablet Take 0.5-1 tablets (50-100 mg total) by mouth daily as needed for erectile dysfunction. 04/27/19   Particia Nearing, PA-C  traZODone (DESYREL) 50 MG tablet Take 1 tablet (50 mg total) by mouth at bedtime as needed for sleep. 07/10/19   Particia Nearing, PA-C    Allergies Patient has no known allergies.  Family History  Problem Relation Age of Onset  . Bone cancer Mother     Social History Social History   Tobacco Use  . Smoking status: Never Smoker  . Smokeless tobacco: Never Used  Vaping Use  . Vaping Use: Never used  Substance Use Topics  . Alcohol use: Not Currently    Comment: socially  . Drug use: No    Review of Systems Constitutional: No fever/chills Eyes: No visual changes. ENT: Positive for sore throat. Cardiovascular: Denies chest pain. Respiratory: Positive for cough.  Gastrointestinal: No abdominal pain.  No nausea, no vomiting.  No diarrhea.   Genitourinary: Negative for dysuria. Musculoskeletal: Negative for back pain. Skin: Negative for rash. Neurological: Positive for headache.  ____________________________________________   PHYSICAL EXAM:  VITAL SIGNS: ED Triage Vitals  Enc Vitals Group     BP 05/18/20 1731 (!) 161/74     Pulse Rate 05/18/20 1731 89     Resp 05/18/20 1731 20     Temp 05/18/20 1731 98.2 F (36.8 C)     Temp Source 05/18/20 1731 Oral     SpO2 05/18/20 1731 99 %     Weight 05/18/20 1738 211 lb (95.7 kg)     Height 05/18/20 1738 5'  7" (1.702 m)   Constitutional: Alert and oriented.  Eyes: Conjunctivae are normal.  ENT      Head: Normocephalic and atraumatic.      Nose: No congestion/rhinnorhea.      Mouth/Throat: Mucous membranes are moist.      Neck: No stridor. Hematological/Lymphatic/Immunilogical: No cervical lymphadenopathy. Cardiovascular: Normal rate, regular rhythm.  No murmurs, rubs, or gallops.  Respiratory: Normal respiratory effort without tachypnea nor retractions. Frequent dry cough.  Gastrointestinal: Soft and non tender. No rebound. No guarding.  Genitourinary: Deferred Musculoskeletal: Normal range of motion in all extremities. No lower extremity edema. Neurologic:  Normal speech and language. No gross focal neurologic deficits are appreciated.  Skin:  Skin is warm, dry and intact. No rash noted. Psychiatric: Mood and affect are normal. Speech and behavior are normal. Patient exhibits appropriate insight and judgment.  ____________________________________________    LABS (pertinent positives/negatives)  Trop hs 5  CBC wbc 8.4, hgb 13.1, plt 257 BMP wnl except glu 204  ____________________________________________   EKG  I, Phineas Semen, attending physician, personally viewed and interpreted this EKG  EKG Time: 1735 Rate: 84 Rhythm: sinus rhythm Axis: normal Intervals: qtc 434 QRS: incomplete RBBB ST changes: no st elevation Impression: abnormal ekg  ____________________________________________    RADIOLOGY  None  ____________________________________________   PROCEDURES  Procedures  ____________________________________________   INITIAL IMPRESSION / ASSESSMENT AND PLAN / ED COURSE  Pertinent labs & imaging results that were available during my care of the patient were reviewed by me and considered in my medical decision making (see chart for details).   Patient presented to the emergency department today because of concern for cough and sore throat. COVID was  negative. Had CXR performed at [facility] earlier today which did not show pneumonia. Blood work without concerning findings. At this time will plan on treating for URI. Will give z-pak and cough medication. ____________________________________________   FINAL CLINICAL IMPRESSION(S) / ED DIAGNOSES  Final diagnoses:  Upper respiratory tract infection, unspecified type     Note: This dictation was prepared with Dragon dictation. Any transcriptional errors that result from this process are unintentional     Phineas Semen, MD 05/18/20 [phone]

## 2020-05-20 ENCOUNTER — Other Ambulatory Visit: Payer: Self-pay | Admitting: Family Medicine

## 2020-05-20 NOTE — Telephone Encounter (Signed)
Requested Prescriptions  Pending Prescriptions Disp Refills  . amitriptyline (ELAVIL) 100 MG tablet [Pharmacy Med Name: AMITRIPTYLINE HCL 100 MG TAB] 30 tablet 0    Sig: Take 1 tablet (100 mg total) by mouth at bedtime.     Psychiatry:  Antidepressants - Heterocyclics (TCAs) Failed - 05/20/2020 11:28 AM      Failed - Valid encounter within last 6 months    Recent Outpatient Visits          9 months ago Cellulitis of left lower extremity   [facility] Roosvelt Maser [place], [place]   10 months ago Cough   [facility] Roosvelt Maser [place], [place]   10 months ago Pain at surgical incision   [facility] Particia Nearing, [place]   1 year ago Epigastric pain   [facility] Roosvelt Maser [place], [place]   1 year ago Bronchitis   [facility] Particia Nearing, [place]              Phone  Call to pt.  Left vm to call office to schedule a f/u appt. (last seen 07/2019)  Courtesy refill given at this time.

## 2020-06-03 ENCOUNTER — Encounter: Payer: Self-pay | Admitting: Nurse Practitioner

## 2020-06-03 ENCOUNTER — Telehealth (INDEPENDENT_AMBULATORY_CARE_PROVIDER_SITE_OTHER): Payer: Medicare Other | Admitting: Nurse Practitioner

## 2020-06-03 ENCOUNTER — Other Ambulatory Visit: Payer: Self-pay

## 2020-06-03 VITALS — BP 119/65 | HR 76 | Temp 101.8°F | Wt 208.0 lb

## 2020-06-03 DIAGNOSIS — R059 Cough, unspecified: Secondary | ICD-10-CM | POA: Diagnosis not present

## 2020-06-03 DIAGNOSIS — J069 Acute upper respiratory infection, unspecified: Secondary | ICD-10-CM | POA: Diagnosis not present

## 2020-06-03 MED ORDER — HYDROCOD POLST-CPM POLST ER 10-8 MG/5ML PO SUER: 5.0000 mL | Freq: Two times a day (BID) | ORAL | 0 refills | Status: DC | PRN

## 2020-06-03 MED ORDER — LEVOFLOXACIN 750 MG PO TABS: 750.0000 mg | ORAL_TABLET | Freq: Every day | ORAL | 0 refills | Status: DC

## 2020-06-03 NOTE — Patient Instructions (Signed)

## 2020-06-03 NOTE — Progress Notes (Signed)
BP 119/65    Pulse 76    Temp (!) 101.8 F (38.8 C) (Oral)    Wt 208 lb (94.3 kg)    BMI 32.58 kg/m    Subjective:    Patient ID: Mike Kline, male    DOB: 04-07-1971, 49 y.o.   MRN: [id number]  HPI: Mike Kline is a 49 y.o. male presenting virtually with good friend, Mike Kline, for upper respiratory infection symptoms.  Chief Complaint  Patient presents with   URI    pt states he was seen at urgent care, [facility], on 05/18/20. Had x-rays done which are in Care Everywhere. States he is still having a cough, fever, chills, muscle pain, and fatigue. States he has had 2 COVID tests that were negative and the cough hurts exremely bad.    UPPER RESPIRATORY TRACT INFECTION Onset: ~ 2 weeks ago, went to [facility], x-ray did not show pneumonia and Covid test was negative.  Went to the ER where 2 more COVID tests were done and were negative.  Was given a z-pack and stronger cough medicine and seemed to be doing better until recently.  He never fully got better and reports this past Saturday, started coughing worse.  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 Juliann Pares. Worst symptom: cough Fever: yes  Cough: yes; very occasionally productive Shortness of breath: no;only after coughing attack Wheezing: yes Chest pain: yes, with cough Chest tightness: no Chest congestion: yes Nasal congestion: no Runny nose: no Post nasal drip: no Sneezing: no Sore throat: no Swollen glands: no Sinus pressure: no Headache: yes Face pain: no Toothache: no Ear pain: no  Ear pressure: no  Eyes red/itching:no Eye drainage/crusting: no  Nausea: yes Vomiting: no  Change in appetite: yes; decreased Rash: no Fatigue: yes Sick contacts: yes Strep contacts: no  Context: worse Recurrent sinusitis: no Relief with OTC cold/cough medications: yes  Treatments attempted: Theraflu, mucinex, tessalon pearls,  robitussin, Coricidin  No Known Allergies  Outpatient Encounter Medications as of 06/03/2020  Medication Sig   albuterol (PROVENTIL HFA) 108 (90 Base) MCG/ACT inhaler Inhale 2 puffs into the lungs every 4 (four) hours as needed for wheezing or shortness of breath.   amitriptyline (ELAVIL) 100 MG tablet Take 1 tablet (100 mg total) by mouth at bedtime.   benzonatate (TESSALON) 200 MG capsule Take 1 capsule (200 mg total) by mouth 3 (three) times daily as needed.   gabapentin (NEURONTIN) 300 MG capsule Take 1 capsule (300 mg total) by mouth 3 (three) times daily. (Patient taking differently: Take 300 mg by mouth 2 (two) times daily. )   HUMALOG KWIKPEN 100 UNIT/ML KiwkPen USE THREE TIMES A DAY AS DIRECTED.(UP TO 50 UNITS A DAY OR AS DIRECTED BY SLIDING SCALE) (Patient taking differently: Inject 6-10 Units into the skin 3 (three) times daily. )   Insulin Glargine (LANTUS SOLOSTAR) 100 UNIT/ML Solostar Pen Inject 60 Units into the skin daily at 10 pm. (Patient taking differently: Inject 40 Units into the skin daily at 10 pm. )   losartan (COZAAR) 25 MG tablet Take 25 mg by mouth daily.   metoprolol tartrate (LOPRESSOR) 25 MG tablet Take 25 mg by mouth 2 (two) times daily.   pantoprazole (PROTONIX) 40 MG tablet Take 1 tablet (40 mg total) by mouth daily.   pramipexole (MIRAPEX) 0.125 MG tablet Take 1 tablet (0.125 mg total) by  mouth every evening.   rosuvastatin (CRESTOR) 40 MG tablet Take 40 mg by mouth daily.   tadalafil (CIALIS) 10 MG tablet Take 10 mg by mouth daily as needed.   chlorpheniramine-HYDROcodone (TUSSIONEX PENNKINETIC ER) 10-8 MG/5ML SUER Take 5 mLs by mouth every 12 (twelve) hours as needed.   levofloxacin (LEVAQUIN) 750 MG tablet Take 1 tablet (750 mg total) by mouth daily.   sildenafil (VIAGRA) 100 MG tablet Take 0.5-1 tablets (50-100 mg total) by mouth daily as needed for erectile dysfunction. (Patient not taking: Reported on 06/03/2020)   traZODone (DESYREL) 50 MG  tablet Take 1 tablet (50 mg total) by mouth at bedtime as needed for sleep. (Patient not taking: Reported on 06/03/2020)   [DISCONTINUED] chlorpheniramine-HYDROcodone (TUSSIONEX PENNKINETIC ER) 10-8 MG/5ML SUER Take 5 mLs by mouth every 12 (twelve) hours as needed.   [DISCONTINUED] doxycycline (VIBRA-TABS) 100 MG tablet Take 1 tablet (100 mg total) by mouth 2 (two) times daily.   [DISCONTINUED] guaiFENesin-codeine 100-10 MG/5ML syrup Take 5 mLs by mouth 2 (two) times daily as needed for cough.   [DISCONTINUED] guaiFENesin-codeine 100-10 MG/5ML syrup Take 5 mLs by mouth every 6 (six) hours as needed for cough.   No facility-administered encounter medications on file as of 06/03/2020.   Patient Active Problem List   Diagnosis Date Noted   Upper respiratory tract infection 06/03/2020   Anxiety 04/14/2019   Insomnia 05/28/2016   Adhesive capsulitis 12/17/2015   Hypercholesterolemia 10/22/2015   Diabetic, retinopathy, proliferative (HCC) 05/28/2015   Diabetes mellitus type 1 with peripheral artery disease (HCC) 05/28/2015   Hypertension    Type I diabetes mellitus with manifestations, uncontrolled (HCC)    GERD (gastroesophageal reflux disease)    Nuclear sclerotic cataract 07/05/2013   Type 1 diabetes mellitus (HCC) 05/29/2013   Past Medical History:  Diagnosis Date   Adhesive capsulitis    permanent disability 07/2010, left shoulder   Anginal pain (HCC)    Blindness of left eye    Diabetes mellitus without complication (HCC)    Diabetic, retinopathy, proliferative (HCC)    GERD (gastroesophageal reflux disease)    Hyperlipidemia    Hypertension    Relevant past medical, surgical, family and social history reviewed and updated as indicated. Interim medical history since our last visit reviewed.  Review of Systems  Constitutional: Positive for activity change, appetite change, fatigue and fever. Negative for chills and diaphoresis.  HENT: Positive for  congestion. Negative for dental problem, drooling, ear discharge, ear pain, hearing loss, postnasal drip, rhinorrhea, sinus pressure, sinus pain, sneezing, sore throat and trouble swallowing.   Eyes: Negative.  Negative for pain, discharge, redness and itching.  Respiratory: Positive for cough. Negative for chest tightness, shortness of breath and wheezing.   Cardiovascular: Negative.  Negative for chest pain, palpitations and leg swelling.  Gastrointestinal: Positive for nausea. Negative for blood in stool, constipation, diarrhea and vomiting.  Skin: Negative.  Negative for color change, pallor and rash.  Neurological: Negative.  Negative for weakness, light-headedness and headaches.  Hematological: Negative.  Negative for adenopathy.  Psychiatric/Behavioral: Positive for sleep disturbance (Wakes up from coughing frequently). Negative for confusion and decreased concentration.    Per HPI unless specifically indicated above     Objective:    BP 119/65    Pulse 76    Temp (!) 101.8 F (38.8 C) (Oral)    Wt 208 lb (94.3 kg)    BMI 32.58 kg/m   Wt Readings from Last 3 Encounters:  06/03/20 208 lb (94.3 kg)  05/18/20 211 lb (95.7 kg)  07/28/19 180 lb (81.6 kg)    Physical Exam Vitals and nursing note reviewed.  Constitutional:      General: He is not in acute distress.    Appearance: He is ill-appearing. He is not toxic-appearing.  HENT:     Head: Normocephalic and atraumatic.     Right Ear: External ear normal.     Left Ear: External ear normal.     Nose: Nose normal. No congestion or rhinorrhea.     Mouth/Throat:     Mouth: Mucous membranes are moist.     Pharynx: Oropharynx is clear.  Eyes:     General: No scleral icterus. Cardiovascular:     Comments: Unable to assess heart sounds via virtual visit. Pulmonary:     Effort: Pulmonary effort is normal. No respiratory distress.     Comments: Unable to assess lung sounds via virtual visit.  Patient able to talk in complete  sentences but has frequent coughing in between sentences.  No increased work of breathing visualized. Abdominal:     Comments: Unable to assess bowel sounds via virtual visit.  Skin:    Coloration: Skin is not jaundiced.     Findings: No erythema.  Neurological:     Mental Status: He is alert and oriented to person, place, and time.  Psychiatric:        Mood and Affect: Mood normal.        Behavior: Behavior normal.        Thought Content: Thought content normal.        Judgment: Judgment normal.       Assessment & Plan:   Problem List Items Addressed This Visit      Respiratory   Upper respiratory tract infection - Primary    Acute, ongoing.  Do not think that this is viral at this point, symptoms have been ongoing for > 14 days.  Worried about persistent fevers, congested nonproductive cough, fevers, and recent chest x-ray findings of pulmonary congestion.  Will treat as community-acquired pneumonia and with levofloxacin 750 mg for 7 days and obtain chest x-ray as soon as patient is able.  With history of pulmonary congestion, may need to also consider diuretics as cough could be related to this.  Do not want to further dehydrate patient with diuretics without more recent chest x-ray at this time.  Will give Tussionex for cough.  If chest x-ray shows pneumonia, will need repeat chest x-ray upon resolution.  Follow-up by end of week if not feeling better.       Other Visit Diagnoses    Cough       Relevant Medications   chlorpheniramine-HYDROcodone (TUSSIONEX PENNKINETIC ER) 10-8 MG/5ML SUER   Other Relevant Orders   DG Chest 2 View       Follow up plan: No follow-ups on file.   Due to the catastrophic nature of the COVID-19 pandemic, this visit was completed via audio and visual contact via [employer] due to the restrictions of the COVID-19 pandemic. All issues as above were discussed and addressed. Physical exam was done as above through visual confirmation on Caregility. If  it was felt that the patient should be evaluated in the office, they were directed there. The patient verbally consented to this visit."}  Location of the patient: home  Location of the provider: work  Those involved with this call:   Provider: Mardene Celeste, DNP  CMA: Wilhemena Durie, CMA  Front Desk/Registration: Harriet Pho  Time spent on call: 21 minutes with patient face to face via video conference. More than 50% of this time was spent in counseling and coordination of care. 30 minutes total spent in review of patient's record and preparation of their chart.  I verified patient identity using two factors (patient name and date of birth). Patient consents verbally to being seen via telemedicine visit today.

## 2020-06-03 NOTE — Assessment & Plan Note (Addendum)
Acute, ongoing.  Do not think that this is viral at this point, symptoms have been ongoing for > 14 days.  Worried about persistent fevers, congested nonproductive cough, fevers, and recent chest x-ray findings of pulmonary congestion.  Will treat as community-acquired pneumonia and with levofloxacin 750 mg for 7 days and obtain chest x-ray as soon as patient is able.  With history of pulmonary congestion, may need to also consider diuretics as cough could be related to this.  Do not want to further dehydrate patient with diuretics without more recent chest x-ray at this time.  Will give Tussionex for cough.  If chest x-ray shows pneumonia, will need repeat chest x-ray upon resolution.  Follow-up by end of week if not feeling better.

## 2020-06-05 ENCOUNTER — Other Ambulatory Visit: Payer: Self-pay | Admitting: Nurse Practitioner

## 2020-06-05 ENCOUNTER — Ambulatory Visit
Admission: RE | Admit: 2020-06-05 | Discharge: 2020-06-05 | Disposition: A | Discharge status: Home / Self Care | Payer: Medicare Other | Source: Ambulatory Visit | Attending: Nurse Practitioner | Admitting: Nurse Practitioner

## 2020-06-05 ENCOUNTER — Ambulatory Visit
Admission: RE | Admit: 2020-06-05 | Discharge: 2020-06-05 | Disposition: A | Discharge status: Home / Self Care | Payer: Medicare Other | Attending: Nurse Practitioner | Admitting: Nurse Practitioner

## 2020-06-05 ENCOUNTER — Other Ambulatory Visit: Payer: Self-pay

## 2020-06-05 DIAGNOSIS — R059 Cough, unspecified: Secondary | ICD-10-CM | POA: Diagnosis present

## 2020-06-05 MED ORDER — GUAIFENESIN-CODEINE 100-10 MG/5ML PO SOLN
5.0000 mL | Freq: Two times a day (BID) | ORAL | 0 refills | Status: DC | PRN
Start: 2020-06-05 — End: 2020-07-02

## 2020-06-06 ENCOUNTER — Other Ambulatory Visit: Payer: Self-pay | Admitting: Physician Assistant

## 2020-06-06 ENCOUNTER — Ambulatory Visit (HOSPITAL_COMMUNITY)
Admission: RE | Admit: 2020-06-06 | Discharge: 2020-06-06 | Disposition: A | Discharge status: Home / Self Care | Payer: Medicare Other | Source: Ambulatory Visit | Attending: Pulmonary Disease | Admitting: Pulmonary Disease

## 2020-06-06 ENCOUNTER — Telehealth: Payer: Self-pay | Admitting: Physician Assistant

## 2020-06-06 DIAGNOSIS — Z23 Encounter for immunization: Secondary | ICD-10-CM | POA: Insufficient documentation

## 2020-06-06 DIAGNOSIS — I10 Essential (primary) hypertension: Secondary | ICD-10-CM

## 2020-06-06 DIAGNOSIS — U071 COVID-19: Secondary | ICD-10-CM

## 2020-06-06 DIAGNOSIS — E1051 Type 1 diabetes mellitus with diabetic peripheral angiopathy without gangrene: Secondary | ICD-10-CM | POA: Insufficient documentation

## 2020-06-06 MED ORDER — DIPHENHYDRAMINE HCL 50 MG/ML IJ SOLN: 50.0000 mg | Freq: Once | INTRAMUSCULAR | Status: DC | PRN

## 2020-06-06 MED ORDER — FAMOTIDINE IN NACL 20-0.9 MG/50ML-% IV SOLN: 20.0000 mg | Freq: Once | INTRAVENOUS | Status: DC | PRN

## 2020-06-06 MED ORDER — SODIUM CHLORIDE 0.9 % IV SOLN: INTRAVENOUS | Status: DC | PRN

## 2020-06-06 MED ORDER — METHYLPREDNISOLONE SODIUM SUCC 125 MG IJ SOLR: 125.0000 mg | Freq: Once | INTRAMUSCULAR | Status: DC | PRN

## 2020-06-06 MED ORDER — SODIUM CHLORIDE 0.9 % IV SOLN: Freq: Once | INTRAVENOUS | Status: AC

## 2020-06-06 MED ORDER — EPINEPHRINE 0.3 MG/0.3ML IJ SOAJ: 0.3000 mg | Freq: Once | INTRAMUSCULAR | Status: DC | PRN

## 2020-06-06 MED ORDER — ALBUTEROL SULFATE HFA 108 (90 BASE) MCG/ACT IN AERS: 2.0000 | INHALATION_SPRAY | Freq: Once | RESPIRATORY_TRACT | Status: DC | PRN

## 2020-06-06 MED ORDER — ACETAMINOPHEN 325 MG PO TABS
650.0000 mg | ORAL_TABLET | Freq: Once | ORAL | Status: AC
  Administered 2020-06-06: 650 mg via ORAL
  Filled 2020-06-06: qty 2

## 2020-06-06 NOTE — Progress Notes (Signed)
  Diagnosis: COVID-19  Physician: Dr. Wright  Procedure: [facility] Infusion [facility] Med: bamlanivimab\etesevimab infusion - Provided patient with bamlanimivab\etesevimab fact sheet for patients, parents and caregivers prior to infusion.  Complications: No immediate complications noted.  Discharge: Discharged home   Fontella Shan M Jewelz Ricklefs 06/06/2020  

## 2020-06-06 NOTE — Telephone Encounter (Signed)
Awesome!  That is great to hear.  Hope he can get it today.

## 2020-06-06 NOTE — Telephone Encounter (Signed)
Called to discuss with patient about Covid symptoms and the use of bamlanivimab/etesevimab or casirivimab/imdevimab, a monoclonal antibody infusion for those with mild to moderate Covid symptoms and at a high risk of hospitalization.  Pt is qualified for this infusion at the [facility] infusion center due to; Specific high risk criteria : BMI > 25, Diabetes and Cardiovascular disease or hypertension   Message left to call back our hotline [phone]. Also sent a mychart message with more information.   [name] PA-C  [facility]

## 2020-06-06 NOTE — Discharge Instructions (Signed)

## 2020-06-06 NOTE — Telephone Encounter (Signed)
He just [employer] me back. I think we will get him set up for today

## 2020-06-06 NOTE — Progress Notes (Signed)
I connected by phone with Mike Kline on 06/06/2020 at 12:17 PM to discuss the potential use of a new treatment for mild to moderate COVID-19 viral infection in non-hospitalized patients.  This patient is a 49 y.o. male that meets the FDA criteria for Emergency Use Authorization of COVID monoclonal antibody casirivimab/imdevimab or bamlamivimab/estevimab.  Has a (+) direct SARS-CoV-2 viral test result  Has mild or moderate COVID-19   Is NOT hospitalized due to COVID-19  Is within 10 days of symptom onset  Has at least one of the high risk factor(s) for progression to severe COVID-19 and/or hospitalization as defined in EUA.  Specific high risk criteria : BMI > 25, Diabetes and Cardiovascular disease or hypertension   I have spoken and communicated the following to the patient or parent/caregiver regarding COVID monoclonal antibody treatment:  1. FDA has authorized the emergency use for the treatment of mild to moderate COVID-19 in adults and pediatric patients with positive results of direct SARS-CoV-2 viral testing who are 39 years of age and older weighing at least 40 kg, and who are at high risk for progressing to severe COVID-19 and/or hospitalization.  2. The significant known and potential risks and benefits of COVID monoclonal antibody, and the extent to which such potential risks and benefits are unknown.  3. Information on available alternative treatments and the risks and benefits of those alternatives, including clinical trials.  4. Patients treated with COVID monoclonal antibody should continue to self-isolate and use infection control measures (e.g., wear mask, isolate, social distance, avoid sharing personal items, clean and disinfect "high touch" surfaces, and frequent handwashing) according to CDC guidelines.   5. The patient or parent/caregiver has the option to accept or refuse COVID monoclonal antibody treatment.  After reviewing this information with the patient,  the patient has agreed to receive one of the available covid 19 monoclonal antibodies and will be provided an appropriate fact sheet prior to infusion.  Sx onset 10/23. Set up for infusion on 10/28 @ 2:30pm. Directions given to [facility]. Pt is aware that insurance will be charged an infusion fee. Pt is unvaccinated.   [name] 06/06/2020 12:17 PM

## 2020-06-10 ENCOUNTER — Other Ambulatory Visit: Payer: Self-pay | Admitting: Family Medicine

## 2020-06-10 NOTE — Telephone Encounter (Signed)
Requested medication (s) are due for refill today: no  Requested medication (s) are on the active medication list: yes  Last refill:  05/20/20  Future visit scheduled: no  Notes to clinic:  called pt and LM on VM to call office to schedule office visit   Requested Prescriptions  Pending Prescriptions Disp Refills   amitriptyline (ELAVIL) 100 MG tablet [Pharmacy Med Name: AMITRIPTYLINE HCL 100 MG TAB] 30 tablet 0    Sig: Take 1 tablet (100 mg total) by mouth at bedtime.      Psychiatry:  Antidepressants - Heterocyclics (TCAs) Passed - 06/10/2020  5:13 PM      Passed - Valid encounter within last 6 months    Recent Outpatient Visits           1 week ago Upper respiratory tract infection, unspecified type   [facility] Valentino Nose, NP   10 months ago Cellulitis of left lower extremity   [facility] Roosvelt Maser [place], [place]   10 months ago Cough   [facility] Roosvelt Maser [place], [place]   11 months ago Pain at surgical incision   [facility] Particia Nearing, [place]   1 year ago Epigastric pain   [facility] Roosvelt Maser [place], [place]

## 2020-06-13 ENCOUNTER — Emergency Department: Payer: Medicare Other

## 2020-06-13 ENCOUNTER — Encounter: Payer: Self-pay | Admitting: Internal Medicine

## 2020-06-13 ENCOUNTER — Telehealth (INDEPENDENT_AMBULATORY_CARE_PROVIDER_SITE_OTHER): Payer: Medicare Other | Admitting: Nurse Practitioner

## 2020-06-13 ENCOUNTER — Inpatient Hospital Stay
Admission: EM | Admit: 2020-06-13 | Discharge: 2020-06-20 | DRG: 177 | Disposition: A | Discharge status: Home / Self Care | Payer: Medicare Other | Attending: Hospitalist | Admitting: Hospitalist

## 2020-06-13 DIAGNOSIS — Z6832 Body mass index (BMI) 32.0-32.9, adult: Secondary | ICD-10-CM

## 2020-06-13 DIAGNOSIS — G4700 Insomnia, unspecified: Secondary | ICD-10-CM | POA: Diagnosis present

## 2020-06-13 DIAGNOSIS — K5900 Constipation, unspecified: Secondary | ICD-10-CM | POA: Diagnosis present

## 2020-06-13 DIAGNOSIS — J9601 Acute respiratory failure with hypoxia: Secondary | ICD-10-CM | POA: Diagnosis present

## 2020-06-13 DIAGNOSIS — U071 COVID-19: Secondary | ICD-10-CM | POA: Diagnosis present

## 2020-06-13 DIAGNOSIS — Z794 Long term (current) use of insulin: Secondary | ICD-10-CM | POA: Diagnosis not present

## 2020-06-13 DIAGNOSIS — K219 Gastro-esophageal reflux disease without esophagitis: Secondary | ICD-10-CM | POA: Diagnosis present

## 2020-06-13 DIAGNOSIS — F419 Anxiety disorder, unspecified: Secondary | ICD-10-CM | POA: Diagnosis present

## 2020-06-13 DIAGNOSIS — E103599 Type 1 diabetes mellitus with proliferative diabetic retinopathy without macular edema, unspecified eye: Secondary | ICD-10-CM | POA: Diagnosis not present

## 2020-06-13 DIAGNOSIS — J1282 Pneumonia due to coronavirus disease 2019: Secondary | ICD-10-CM | POA: Diagnosis present

## 2020-06-13 DIAGNOSIS — I10 Essential (primary) hypertension: Secondary | ICD-10-CM | POA: Diagnosis present

## 2020-06-13 DIAGNOSIS — H5462 Unqualified visual loss, left eye, normal vision right eye: Secondary | ICD-10-CM | POA: Diagnosis present

## 2020-06-13 DIAGNOSIS — J9600 Acute respiratory failure, unspecified whether with hypoxia or hypercapnia: Secondary | ICD-10-CM | POA: Diagnosis not present

## 2020-06-13 DIAGNOSIS — G8929 Other chronic pain: Secondary | ICD-10-CM | POA: Diagnosis present

## 2020-06-13 DIAGNOSIS — R410 Disorientation, unspecified: Secondary | ICD-10-CM | POA: Diagnosis present

## 2020-06-13 DIAGNOSIS — A858 Other specified viral encephalitis: Secondary | ICD-10-CM | POA: Diagnosis present

## 2020-06-13 DIAGNOSIS — Z9842 Cataract extraction status, left eye: Secondary | ICD-10-CM | POA: Diagnosis not present

## 2020-06-13 DIAGNOSIS — G928 Other toxic encephalopathy: Secondary | ICD-10-CM | POA: Diagnosis present

## 2020-06-13 DIAGNOSIS — M954 Acquired deformity of chest and rib: Secondary | ICD-10-CM | POA: Diagnosis present

## 2020-06-13 DIAGNOSIS — E1051 Type 1 diabetes mellitus with diabetic peripheral angiopathy without gangrene: Secondary | ICD-10-CM | POA: Diagnosis present

## 2020-06-13 DIAGNOSIS — N179 Acute kidney failure, unspecified: Secondary | ICD-10-CM | POA: Diagnosis present

## 2020-06-13 DIAGNOSIS — Z0184 Encounter for antibody response examination: Secondary | ICD-10-CM

## 2020-06-13 DIAGNOSIS — R4182 Altered mental status, unspecified: Secondary | ICD-10-CM | POA: Diagnosis present

## 2020-06-13 DIAGNOSIS — Z9049 Acquired absence of other specified parts of digestive tract: Secondary | ICD-10-CM

## 2020-06-13 DIAGNOSIS — J9690 Respiratory failure, unspecified, unspecified whether with hypoxia or hypercapnia: Secondary | ICD-10-CM | POA: Diagnosis present

## 2020-06-13 DIAGNOSIS — G9340 Encephalopathy, unspecified: Secondary | ICD-10-CM | POA: Diagnosis not present

## 2020-06-13 DIAGNOSIS — E7800 Pure hypercholesterolemia, unspecified: Secondary | ICD-10-CM | POA: Diagnosis present

## 2020-06-13 DIAGNOSIS — E785 Hyperlipidemia, unspecified: Secondary | ICD-10-CM | POA: Diagnosis present

## 2020-06-13 DIAGNOSIS — L03113 Cellulitis of right upper limb: Secondary | ICD-10-CM

## 2020-06-13 DIAGNOSIS — E1065 Type 1 diabetes mellitus with hyperglycemia: Secondary | ICD-10-CM | POA: Diagnosis present

## 2020-06-13 DIAGNOSIS — IMO0002 Reserved for concepts with insufficient information to code with codable children: Secondary | ICD-10-CM

## 2020-06-13 DIAGNOSIS — E871 Hypo-osmolality and hyponatremia: Secondary | ICD-10-CM

## 2020-06-13 LAB — URINALYSIS, COMPLETE (UACMP) WITH MICROSCOPIC
Bacteria, UA: NONE SEEN
Bilirubin Urine: NEGATIVE
Glucose, UA: >=500 mg/dL — AB
Hgb urine dipstick: NEGATIVE
Ketones, ur: NEGATIVE mg/dL
Leukocytes,Ua: NEGATIVE
Nitrite: NEGATIVE
Protein, ur: 30 mg/dL — AB
Specific Gravity, Urine: 1.017 (ref 1.005–1.030)
pH: 5 (ref 5.0–8.0)

## 2020-06-13 LAB — PROCALCITONIN: Procalcitonin: 5.07 ng/mL

## 2020-06-13 LAB — C-REACTIVE PROTEIN: CRP: 16.7 mg/dL — ABNORMAL HIGH (ref <1.0)

## 2020-06-13 LAB — CBC WITH DIFFERENTIAL/PLATELET
Abs Immature Granulocytes: 0.24 10*3/uL — ABNORMAL HIGH (ref 0.00–0.07)
Basophils Absolute: 0 10*3/uL (ref 0.0–0.1)
Basophils Relative: 0 %
Eosinophils Absolute: 0 10*3/uL (ref 0.0–0.5)
Eosinophils Relative: 1 %
HCT: 31.2 % — ABNORMAL LOW (ref 39.0–52.0)
Hemoglobin: 10.7 g/dL — ABNORMAL LOW (ref 13.0–17.0)
Immature Granulocytes: 3 %
Lymphocytes Relative: 6 %
Lymphs Abs: 0.5 10*3/uL — ABNORMAL LOW (ref 0.7–4.0)
MCH: 26.2 pg (ref 26.0–34.0)
MCHC: 34.3 g/dL (ref 30.0–36.0)
MCV: 76.5 fL — ABNORMAL LOW (ref 80.0–100.0)
Monocytes Absolute: 1.1 10*3/uL — ABNORMAL HIGH (ref 0.1–1.0)
Monocytes Relative: 13 %
Neutro Abs: 6.8 10*3/uL (ref 1.7–7.7)
Neutrophils Relative %: 77 %
Platelets: 407 10*3/uL — ABNORMAL HIGH (ref 150–400)
RBC: 4.08 MIL/uL — ABNORMAL LOW (ref 4.22–5.81)
RDW: 13.7 % (ref 11.5–15.5)
WBC: 8.8 10*3/uL (ref 4.0–10.5)
nRBC: 0 % (ref 0.0–0.2)

## 2020-06-13 LAB — URINE DRUG SCREEN, QUALITATIVE (ARMC ONLY)
Amphetamines, Ur Screen: NOT DETECTED
Barbiturates, Ur Screen: NOT DETECTED
Benzodiazepine, Ur Scrn: NOT DETECTED
Cannabinoid 50 Ng, Ur ~~LOC~~: NOT DETECTED
Cocaine Metabolite,Ur ~~LOC~~: NOT DETECTED
MDMA (Ecstasy)Ur Screen: NOT DETECTED
Methadone Scn, Ur: NOT DETECTED
Opiate, Ur Screen: POSITIVE — AB
Phencyclidine (PCP) Ur S: NOT DETECTED
Tricyclic, Ur Screen: POSITIVE — AB

## 2020-06-13 LAB — COMPREHENSIVE METABOLIC PANEL
ALT: 24 U/L (ref 0–44)
AST: 30 U/L (ref 15–41)
Albumin: 3 g/dL — ABNORMAL LOW (ref 3.5–5.0)
Alkaline Phosphatase: 109 U/L (ref 38–126)
Anion gap: 15 (ref 5–15)
BUN: 61 mg/dL — ABNORMAL HIGH (ref 6–20)
CO2: 19 mmol/L — ABNORMAL LOW (ref 22–32)
Calcium: 8.6 mg/dL — ABNORMAL LOW (ref 8.9–10.3)
Chloride: 85 mmol/L — ABNORMAL LOW (ref 98–111)
Creatinine, Ser: 3.5 mg/dL — ABNORMAL HIGH (ref 0.61–1.24)
GFR, Estimated: 20 mL/min — ABNORMAL LOW (ref >60)
Glucose, Bld: 327 mg/dL — ABNORMAL HIGH (ref 70–99)
Potassium: 4.2 mmol/L (ref 3.5–5.1)
Sodium: 119 mmol/L — CL (ref 135–145)
Total Bilirubin: 0.9 mg/dL (ref 0.3–1.2)
Total Protein: 7.1 g/dL (ref 6.5–8.1)

## 2020-06-13 LAB — GLUCOSE, CAPILLARY
Glucose-Capillary: 141 mg/dL — ABNORMAL HIGH (ref 70–99)
Glucose-Capillary: 149 mg/dL — ABNORMAL HIGH (ref 70–99)
Glucose-Capillary: 153 mg/dL — ABNORMAL HIGH (ref 70–99)
Glucose-Capillary: 258 mg/dL — ABNORMAL HIGH (ref 70–99)
Glucose-Capillary: 274 mg/dL — ABNORMAL HIGH (ref 70–99)

## 2020-06-13 LAB — APTT: aPTT: 31 seconds (ref 24–36)

## 2020-06-13 LAB — PROTIME-INR
INR: 1.2 (ref 0.8–1.2)
Prothrombin Time: 14.7 seconds (ref 11.4–15.2)

## 2020-06-13 LAB — HEMOGLOBIN A1C
Hgb A1c MFr Bld: 10.4 % — ABNORMAL HIGH (ref 4.8–5.6)
Mean Plasma Glucose: 251.78 mg/dL

## 2020-06-13 LAB — MRSA PCR SCREENING: MRSA by PCR: NEGATIVE

## 2020-06-13 LAB — FIBRIN DERIVATIVES D-DIMER (ARMC ONLY): Fibrin derivatives D-dimer (ARMC): 2589.05 ng/mL (FEU) — ABNORMAL HIGH (ref 0.00–499.00)

## 2020-06-13 LAB — LACTIC ACID, PLASMA
Lactic Acid, Venous: 3.5 mmol/L (ref 0.5–1.9)
Lactic Acid, Venous: 1.6 mmol/L (ref 0.5–1.9)

## 2020-06-13 LAB — BRAIN NATRIURETIC PEPTIDE: B Natriuretic Peptide: 114.2 pg/mL — ABNORMAL HIGH (ref 0.0–100.0)

## 2020-06-13 LAB — BLOOD GAS, VENOUS

## 2020-06-13 LAB — CBG MONITORING, ED: Glucose-Capillary: 302 mg/dL — ABNORMAL HIGH (ref 70–99)

## 2020-06-13 MED ORDER — POLYETHYLENE GLYCOL 3350 17 G PO PACK: 17.0000 g | PACK | Freq: Every day | ORAL | Status: DC | PRN

## 2020-06-13 MED ORDER — METHYLPREDNISOLONE SODIUM SUCC 40 MG IJ SOLR
40.0000 mg | Freq: Two times a day (BID) | INTRAMUSCULAR | Status: DC
  Administered 2020-06-13 – 2020-06-18 (×11): 40 mg via INTRAVENOUS
  Filled 2020-06-13 (×11): qty 1

## 2020-06-13 MED ORDER — INSULIN ASPART 100 UNIT/ML ~~LOC~~ SOLN
0.0000 [IU] | SUBCUTANEOUS | Status: DC
  Administered 2020-06-13: 1 [IU] via SUBCUTANEOUS
  Administered 2020-06-13 – 2020-06-14 (×2): 2 [IU] via SUBCUTANEOUS
  Administered 2020-06-14: 1 [IU] via SUBCUTANEOUS
  Administered 2020-06-14: 2 [IU] via SUBCUTANEOUS
  Administered 2020-06-14: 3 [IU] via SUBCUTANEOUS
  Filled 2020-06-13 (×6): qty 1

## 2020-06-13 MED ORDER — DEXMEDETOMIDINE HCL IN NACL 400 MCG/100ML IV SOLN
0.4000 ug/kg/h | INTRAVENOUS | Status: DC
  Administered 2020-06-13 (×2): 0.4 ug/kg/h via INTRAVENOUS
  Administered 2020-06-13: 0.9 ug/kg/h via INTRAVENOUS
  Administered 2020-06-14 (×3): 1 ug/kg/h via INTRAVENOUS
  Administered 2020-06-14: 0.6 ug/kg/h via INTRAVENOUS
  Administered 2020-06-14 – 2020-06-15 (×5): 1 ug/kg/h via INTRAVENOUS
  Administered 2020-06-16: 0.8 ug/kg/h via INTRAVENOUS
  Administered 2020-06-16: 1 ug/kg/h via INTRAVENOUS
  Filled 2020-06-13 (×17): qty 100

## 2020-06-13 MED ORDER — NALOXONE HCL 2 MG/2ML IJ SOSY
1.0000 mg | PREFILLED_SYRINGE | Freq: Once | INTRAMUSCULAR | Status: AC
  Administered 2020-06-13: 1 mg via INTRAVENOUS
  Filled 2020-06-13: qty 2

## 2020-06-13 MED ORDER — INSULIN ASPART 100 UNIT/ML ~~LOC~~ SOLN
0.0000 [IU] | Freq: Three times a day (TID) | SUBCUTANEOUS | Status: DC
  Administered 2020-06-13 (×2): 5 [IU] via SUBCUTANEOUS
  Filled 2020-06-13 (×2): qty 1

## 2020-06-13 MED ORDER — LORAZEPAM 2 MG/ML IJ SOLN
1.0000 mg | Freq: Once | INTRAMUSCULAR | Status: AC
  Administered 2020-06-13: 1 mg via INTRAVENOUS
  Filled 2020-06-13: qty 1

## 2020-06-13 MED ORDER — INSULIN ASPART 100 UNIT/ML ~~LOC~~ SOLN: 0.0000 [IU] | Freq: Every day | SUBCUTANEOUS | Status: DC

## 2020-06-13 MED ORDER — SODIUM CHLORIDE 0.9% FLUSH: 3.0000 mL | INTRAVENOUS | Status: DC | PRN

## 2020-06-13 MED ORDER — SODIUM CHLORIDE 0.9% FLUSH
3.0000 mL | Freq: Two times a day (BID) | INTRAVENOUS | Status: DC
  Administered 2020-06-13 – 2020-06-20 (×13): 3 mL via INTRAVENOUS

## 2020-06-13 MED ORDER — SODIUM CHLORIDE 0.9 % IV SOLN
200.0000 mg | Freq: Once | INTRAVENOUS | Status: AC
  Administered 2020-06-13: 200 mg via INTRAVENOUS
  Filled 2020-06-13: qty 200

## 2020-06-13 MED ORDER — FAMOTIDINE IN NACL 20-0.9 MG/50ML-% IV SOLN
20.0000 mg | Freq: Two times a day (BID) | INTRAVENOUS | Status: DC
  Administered 2020-06-13: 20 mg via INTRAVENOUS
  Filled 2020-06-13: qty 50

## 2020-06-13 MED ORDER — SODIUM CHLORIDE 0.9 % IV BOLUS
1000.0000 mL | Freq: Once | INTRAVENOUS | Status: AC
  Administered 2020-06-13: 1000 mL via INTRAVENOUS

## 2020-06-13 MED ORDER — ACETAMINOPHEN 325 MG PO TABS: 650.0000 mg | ORAL_TABLET | ORAL | Status: DC | PRN

## 2020-06-13 MED ORDER — ONDANSETRON HCL 4 MG/2ML IJ SOLN: 4.0000 mg | Freq: Four times a day (QID) | INTRAMUSCULAR | Status: DC | PRN

## 2020-06-13 MED ORDER — INSULIN GLARGINE 100 UNIT/ML ~~LOC~~ SOLN
20.0000 [IU] | Freq: Every day | SUBCUTANEOUS | Status: DC
  Administered 2020-06-13 – 2020-06-14 (×2): 20 [IU] via SUBCUTANEOUS
  Filled 2020-06-13 (×2): qty 0.2

## 2020-06-13 MED ORDER — CHLORHEXIDINE GLUCONATE CLOTH 2 % EX PADS
6.0000 | MEDICATED_PAD | Freq: Every day | CUTANEOUS | Status: DC
  Administered 2020-06-13 – 2020-06-20 (×7): 6 via TOPICAL

## 2020-06-13 MED ORDER — DOCUSATE SODIUM 100 MG PO CAPS: 100.0000 mg | ORAL_CAPSULE | Freq: Two times a day (BID) | ORAL | Status: DC | PRN

## 2020-06-13 MED ORDER — SODIUM CHLORIDE 0.9 % IV SOLN
500.0000 mg | INTRAVENOUS | Status: DC
  Administered 2020-06-13 – 2020-06-14 (×2): 500 mg via INTRAVENOUS
  Filled 2020-06-13 (×2): qty 500

## 2020-06-13 MED ORDER — SODIUM CHLORIDE 0.9 % IV SOLN
250.0000 mL | INTRAVENOUS | Status: DC | PRN
  Administered 2020-06-13 – 2020-06-18 (×2): 250 mL via INTRAVENOUS

## 2020-06-13 MED ORDER — SODIUM CHLORIDE 0.9 % IV SOLN
100.0000 mg | Freq: Every day | INTRAVENOUS | Status: AC
  Administered 2020-06-15 – 2020-06-17 (×3): 100 mg via INTRAVENOUS
  Filled 2020-06-13: qty 100
  Filled 2020-06-13: qty 20
  Filled 2020-06-13 (×2): qty 100

## 2020-06-13 MED ORDER — FAMOTIDINE IN NACL 20-0.9 MG/50ML-% IV SOLN
20.0000 mg | INTRAVENOUS | Status: DC
  Administered 2020-06-14 – 2020-06-18 (×5): 20 mg via INTRAVENOUS
  Filled 2020-06-13 (×5): qty 50

## 2020-06-13 MED ORDER — LACTATED RINGERS IV SOLN: INTRAVENOUS | Status: DC

## 2020-06-13 MED ORDER — ENOXAPARIN SODIUM 40 MG/0.4ML ~~LOC~~ SOLN
40.0000 mg | SUBCUTANEOUS | Status: DC
  Administered 2020-06-13: 40 mg via SUBCUTANEOUS
  Filled 2020-06-13: qty 0.4

## 2020-06-13 MED ORDER — SODIUM CHLORIDE 0.9 % IV SOLN
2.0000 g | INTRAVENOUS | Status: DC
  Administered 2020-06-13 – 2020-06-14 (×2): 2 g via INTRAVENOUS
  Filled 2020-06-13 (×2): qty 20

## 2020-06-13 NOTE — ED Provider Notes (Signed)
[facility] Emergency Department Provider Note  ____________________________________________   First MD Initiated Contact with Patient 06/13/20 [phone]     (approximate)  I have reviewed the triage vital signs and the nursing notes.  Level 5 caveat history review of system limited secondary to altered mental status HISTORY  Chief Complaint Altered Mental Status   HPI Mike Kline is a 49 y.o. male with below list of previous medical conditions including diabetes mellitus, hypertension, recent diagnosis of COVID-19 on 06/04/2020 presents to the emergency department via private vehicle that he was assisted out of secondary to apparent respiratory difficulty and altered mental status.        Past Medical History:  Diagnosis Date  . Adhesive capsulitis    permanent disability 07/2010, left shoulder  . Anginal pain (HCC)   . Blindness of left eye   . Diabetes mellitus without complication (HCC)   . Diabetic, retinopathy, proliferative (HCC)   . GERD (gastroesophageal reflux disease)   . Hyperlipidemia   . Hypertension     Patient Active Problem List   Diagnosis Date Noted  . Upper respiratory tract infection 06/03/2020  . Anxiety 04/14/2019  . Insomnia 05/28/2016  . Adhesive capsulitis 12/17/2015  . Hypercholesterolemia 10/22/2015  . Diabetic, retinopathy, proliferative (HCC) 05/28/2015  . Diabetes mellitus type 1 with peripheral artery disease (HCC) 05/28/2015  . Hypertension   . Type I diabetes mellitus with manifestations, uncontrolled (HCC)   . GERD (gastroesophageal reflux disease)   . Nuclear sclerotic cataract 07/05/2013  . Type 1 diabetes mellitus (HCC) 05/29/2013    Past Surgical History:  Procedure Laterality Date  . APPENDECTOMY    . CARPAL TUNNEL RELEASE Bilateral   . cataract surgery Bilateral 2014  . EYE SURGERY Left   . LEFT HEART CATH AND CORONARY ANGIOGRAPHY Left 05/22/2019   Procedure: LEFT HEART CATH AND CORONARY  ANGIOGRAPHY;  Surgeon: Alwyn Pea, MD;  Location: [facility] INVASIVE CV LAB;  Service: Cardiovascular;  Laterality: Left;  . SHOULDER SURGERY Left 2009    Prior to Admission medications   Medication Sig Start Date End Date Taking? Authorizing Provider  albuterol (PROVENTIL HFA) 108 (90 Base) MCG/ACT inhaler Inhale 2 puffs into the lungs every 4 (four) hours as needed for wheezing or shortness of breath. 09/13/18   Particia Nearing, PA-C  amitriptyline (ELAVIL) 100 MG tablet Take 1 tablet (100 mg total) by mouth at bedtime. 05/20/20   Cannady, Corrie Dandy T, NP  benzonatate (TESSALON) 200 MG capsule Take 1 capsule (200 mg total) by mouth 3 (three) times daily as needed. 08/08/19   Particia Nearing, PA-C  gabapentin (NEURONTIN) 300 MG capsule Take 1 capsule (300 mg total) by mouth 3 (three) times daily. Patient taking differently: Take 300 mg by mouth 2 (two) times daily.  11/30/19   Particia Nearing, PA-C  guaiFENesin-codeine 100-10 MG/5ML syrup Take 5 mLs by mouth every 12 (twelve) hours as needed for cough. To take when Tussionex prescription runs out.  Do not take both at the same time. 06/05/20   Valentino Nose, NP  HUMALOG KWIKPEN 100 UNIT/ML KiwkPen USE THREE TIMES A DAY AS DIRECTED.(UP TO 50 UNITS A DAY OR AS DIRECTED BY SLIDING SCALE) Patient taking differently: Inject 6-10 Units into the skin 3 (three) times daily.  12/30/15   Steele Sizer, MD  Insulin Glargine (LANTUS SOLOSTAR) 100 UNIT/ML Solostar Pen Inject 60 Units into the skin daily at 10 pm. Patient taking differently: Inject 40 Units into the  skin daily at 10 pm.  01/07/16   Steele Sizer, MD  levofloxacin (LEVAQUIN) 750 MG tablet Take 1 tablet (750 mg total) by mouth daily. 06/03/20   Valentino Nose, NP  losartan (COZAAR) 25 MG tablet Take 25 mg by mouth daily. 04/08/20   [provider]  metoprolol tartrate (LOPRESSOR) 25 MG tablet Take 25 mg by mouth 2 (two) times daily. 06/12/19   [provider]  pantoprazole (PROTONIX) 40 MG tablet Take 1 tablet (40 mg total) by mouth daily. 03/01/20   Particia Nearing, PA-C  pramipexole (MIRAPEX) 0.125 MG tablet Take 1 tablet (0.125 mg total) by mouth every evening. 05/10/20   Valentino Nose, NP  rosuvastatin (CRESTOR) 40 MG tablet Take 40 mg by mouth daily. 05/28/20   [provider]  sildenafil (VIAGRA) 100 MG tablet Take 0.5-1 tablets (50-100 mg total) by mouth daily as needed for erectile dysfunction. Patient not taking: Reported on 06/03/2020 04/27/19   Particia Nearing, PA-C  tadalafil (CIALIS) 10 MG tablet Take 10 mg by mouth daily as needed. 05/09/20 07/08/20  [provider]  traZODone (DESYREL) 50 MG tablet Take 1 tablet (50 mg total) by mouth at bedtime as needed for sleep. Patient not taking: Reported on 06/03/2020 07/10/19   Particia Nearing, PA-C    Allergies Patient has no known allergies.  Family History  Problem Relation Age of Onset  . Bone cancer Mother     Social History Social History   Tobacco Use  . Smoking status: Never Smoker  . Smokeless tobacco: Never Used  Vaping Use  . Vaping Use: Never used  Substance Use Topics  . Alcohol use: Not Currently    Comment: socially  . Drug use: No    Review of Systems Constitutional: No fever/chills Eyes: No visual changes. ENT: No sore throat. Cardiovascular: Denies chest pain. Respiratory: Does not for cough and dyspnea shortness of breath. Gastrointestinal: No abdominal pain.  No nausea, no vomiting.  No diarrhea.  No constipation. Genitourinary: Negative for dysuria. Musculoskeletal: Negative for neck pain.  Negative for back pain. Integumentary: Negative for rash. Neurological: Positive for altered mental status  ____________________________________________   PHYSICAL EXAM:  VITAL SIGNS: ED Triage Vitals [06/13/20 0509]  Enc Vitals Group     BP 104/84     Pulse Rate 93     Resp (!) 30     Temp 97.8 F  (36.6 C)     Temp Source Oral     SpO2 (!) 89 %     Weight      Height      Head Circumference      Peak Flow      Pain Score      Pain Loc      Pain Edu?      Excl. in GC?     Constitutional: Alert and oriented to self only.  Apparent dyspnea Eyes: Conjunctivae are normal.  Head: Atraumatic. Mouth/Throat: Patient is wearing a mask. Neck: No stridor.  No meningeal signs.   Cardiovascular: Normal rate, regular rhythm. Good peripheral circulation. Grossly normal heart sounds. Respiratory: Tachypnea diffuse rhonchi positive accessory respiratory muscle use Gastrointestinal: Soft and nontender. No distention.  Musculoskeletal: No lower extremity tenderness nor edema. No gross deformities of extremities. Neurologic:   No gross focal neurologic deficits are appreciated.  Skin: Blanching erythema right shoulder hot to touch at insulin pump site. Psychiatric: Mood and affect are normal. Speech and behavior are normal.  ____________________________________________  LABS (all labs ordered are listed, but only abnormal results are displayed)  Labs Reviewed  LACTIC ACID, PLASMA - Abnormal; Notable for the following components:      Result Value   Lactic Acid, Venous 3.5 (*)    All other components within normal limits  URINALYSIS, COMPLETE (UACMP) WITH MICROSCOPIC - Abnormal; Notable for the following components:   Color, Urine YELLOW (*)    APPearance CLEAR (*)    Glucose, UA >=500 (*)    Protein, ur 30 (*)    All other components within normal limits  BLOOD GAS, VENOUS - Abnormal; Notable for the following components:   pO2, Ven <31.0 (*)    Acid-base deficit 4.9 (*)    All other components within normal limits  CBG MONITORING, ED - Abnormal; Notable for the following components:   Glucose-Capillary 302 (*)    All other components within normal limits  CULTURE, BLOOD (ROUTINE X 2)  CULTURE, BLOOD (ROUTINE X 2)  URINE CULTURE  LACTIC ACID, PLASMA  COMPREHENSIVE METABOLIC  PANEL  CBC WITH DIFFERENTIAL/PLATELET  PROTIME-INR  APTT  BRAIN NATRIURETIC PEPTIDE  URINE DRUG SCREEN, QUALITATIVE ([facility] ONLY)   ____________________________________________  EKG  ED ECG REPORT I, [place] N Shaune Malacara, the attending physician, personally viewed and interpreted this ECG.   Date: 06/13/2020  EKG Time: 5:21 AM  Rate: 93  Rhythm: Normal sinus rhythm  Axis: Normal  Intervals: Normal  ST&T Change: None  ____________________________________________  RADIOLOGY I, [place] Charlcie Prisco, personally viewed and evaluated these images (plain radiographs) as part of my medical decision making, as well as reviewing the written report by the radiologist.  ED MD interpretation: Interval increase bilaterally airspace densities on chest x-ray per radiologist  Official radiology report(s): DG Chest Port 1 View  Result Date: 06/13/2020 CLINICAL DATA:  Evaluate for sepsis. EXAM: PORTABLE CHEST 1 VIEW COMPARISON:  06/05/2020 FINDINGS: Previous median sternotomy and CABG procedure. Stable cardiomediastinal contours. Interval increase in diffuse, bilateral interstitial and airspace densities. No lobar consolidation. Visualized osseous structures are unremarkable. IMPRESSION: Interval increase in bilateral interstitial and airspace densities compatible with worsening pulmonary edema versus multifocal infection. Electronically Signed   By: Signa Kell M.D.   On: 06/13/2020 06:01    ____________________________________________   PROCEDURES    .Critical Care Performed by: Darci Current, MD Authorized by: Darci Current, MD   Critical care provider statement:    Critical care time (minutes):  60   Critical care time was exclusive of:  Separately billable procedures and treating other patients   Critical care was necessary to treat or prevent imminent or life-threatening deterioration of the following conditions:  Sepsis, respiratory failure and metabolic crisis   Critical care  was time spent personally by me on the following activities:  Development of treatment plan with patient or surrogate, discussions with consultants, evaluation of patient's response to treatment, examination of patient, obtaining history from patient or surrogate, ordering and performing treatments and interventions, ordering and review of laboratory studies, ordering and review of radiographic studies, pulse oximetry, re-evaluation of patient's condition and review of old charts     ____________________________________________   INITIAL IMPRESSION / MDM / ASSESSMENT AND PLAN / ED COURSE  As part of my medical decision making, I reviewed the following data within the electronic MEDICAL RECORD NUMBER  49 year old male presented with above-stated history and physical exam concerning for sepsis and as such sepsis protocol was initiated.  Given patient's respiratory distress BiPAP applied which patient is tolerating well at  present.  Patient's chest x-ray concerning for multifocal pneumonia most likely secondary to recent diagnosis of Covid.  In addition patient noted to be hyponatremic with a sodium of 119.  Patient's initial lactic acid 3.5 for which patient received thirty mils per kilogram of IV normal saline.  Appropriate IV antibiotic therapy initiated as well as remdesivir ordered.  Patient discussed with Dr. Belia HemanKasa intensivist for admission for further evaluation and management ____________________________________________  FINAL CLINICAL IMPRESSION(S) / ED DIAGNOSES  Final diagnoses:  Pneumonia due to COVID-19 virus  Acute respiratory failure with hypoxia (HCC)  Hyponatremia  Right arm cellulitis     MEDICATIONS GIVEN DURING THIS VISIT:  Medications  lactated ringers infusion ( Intravenous New Bag/Given 06/13/20 0553)  cefTRIAXone (ROCEPHIN) 2 g in sodium chloride 0.9 % 100 mL IVPB (2 g Intravenous New Bag/Given 06/13/20 0553)  azithromycin (ZITHROMAX) 500 mg in sodium chloride 0.9 % 250 mL  IVPB (500 mg Intravenous New Bag/Given 06/13/20 0551)  remdesivir 200 mg in sodium chloride 0.9% 250 mL IVPB (has no administration in time range)    Followed by  remdesivir 100 mg in sodium chloride 0.9 % 100 mL IVPB (has no administration in time range)  sodium chloride 0.9 % bolus 1,000 mL (has no administration in time range)  sodium chloride 0.9 % bolus 1,000 mL (has no administration in time range)  naloxone [facility](NARCAN) injection 1 mg (1 mg Intravenous Given 06/13/20 [id number])     ED Discharge Orders    None      *Please note:  Mike Kline was evaluated in Emergency Department on 06/13/2020 for the symptoms described in the history of present illness. He was evaluated in the context of the global COVID-19 pandemic, which necessitated consideration that the patient might be at risk for infection with the SARS-CoV-2 virus that causes COVID-19. Institutional protocols and algorithms that pertain to the evaluation of patients at risk for COVID-19 are in a state of rapid change based on information released by regulatory bodies including the CDC and federal and state organizations. These policies and algorithms were followed during the patient's care in the ED.  Some ED evaluations and interventions may be delayed as a result of limited staffing during and after the pandemic.*  Note:  This document was prepared using Dragon voice recognition software and may include unintentional dictation errors.   Darci CurrentBrown, [place] N, MD 06/13/20 [phone]

## 2020-06-13 NOTE — Progress Notes (Signed)
Remdesivir - Pharmacy Brief Note   O:  ALT:  CXR:  SpO2: 89 % on 2L    A/P:  Remdesivir 200 mg IVPB once followed by 100 mg IVPB daily x 4 days.   Tkeya Stencil D 06/13/2020 6:22 AM

## 2020-06-13 NOTE — ED Notes (Signed)
Date and time results received: 06/13/20 0629   Test: lactate Critical Value: 3.5  Name of Provider Notified: Manson Passey  Orders Received? Or Actions Taken?:

## 2020-06-13 NOTE — Progress Notes (Signed)
RT transported pt to ICU on BIPAP. No issues with the transport.

## 2020-06-13 NOTE — Plan of Care (Signed)
  Problem: Education: Goal: Knowledge of risk factors and measures for prevention of condition will improve Outcome: Progressing   

## 2020-06-13 NOTE — ED Triage Notes (Addendum)
Pt assisted out of vehicle, generalized weakness noted with swelling to abd and legs, congested cough; pt accomp by girlfriend who reports they were both dx with COVID recently, has had confusion at night; pt alert to self but is having difficulty answering questions at this time; grunting respirations; O2 applied at 2l/min via Loop; taken to room 12 via w/c; Dr Manson Passey at bedside; pt placed on card monitor

## 2020-06-13 NOTE — ED Notes (Signed)
Report given to Emily, RN.

## 2020-06-13 NOTE — Progress Notes (Signed)
Inpatient Diabetes Program Recommendations  AACE/ADA: New Consensus Statement on Inpatient Glycemic Control   Target Ranges:  Prepandial:   less than 140 mg/dL      Peak postprandial:   less than 180 mg/dL (1-2 hours)      Critically ill patients:  140 - 180 mg/dL   Results for KAZUMI, LACHNEY (MRN [id number]) as of 06/13/2020 09:32  Ref. Range 06/13/2020 05:19 06/13/2020 09:25 06/13/2020 09:38  Glucose-Capillary Latest Ref Range: 70 - 99 mg/dL 419 (H) 379 (H)    Novolog 5 units   Results for JASPREET, HOLLINGS (MRN [id number]) as of 06/13/2020 09:32  Ref. Range 06/13/2020 05:29  CO2 Latest Ref Range: 22 - 32 mmol/L 19 (L)  Glucose Latest Ref Range: 70 - 99 mg/dL 299 (H)  BUN Latest Ref Range: 6 - 20 mg/dL 61 (H)  Creatinine Latest Ref Range: 0.61 - 1.24 mg/dL 2.42 (H)  Anion gap Latest Ref Range: 5 - 15  15  Results for DETRAVION, TESTER (MRN [id number]) as of 06/13/2020 09:32  Ref. Range 06/13/2020 05:29  Lactic Acid, Venous Latest Ref Range: 0.5 - 1.9 mmol/L 3.5 (HH)   Review of Glycemic Control  Diabetes history: DM1 (makes NO insulin; requires basal, correction, and carbohydrate coverage insulin) Outpatient Diabetes medications: Lantus 40 units QHS, Humalog TID with meals (1 unit per 5 grams of carbs; 1 unit for every 20 mg/dl above target glucose of 120 mg/dl) Current orders for Inpatient glycemic control: Novolog 0-9 units TID with meals, Novolog 0-5 units QHS  Inpatient Diabetes Program Recommendations:    Insulin: Please consider ordering Lantus 20 units Q24H, change CBGs to Q4H, and change Novolog 0-9 units to Q4H.  NOTE: In reviewing chart, noted patient has Type 1 DM and sees Dr. Tedd Sias (Endocrinologist). Per Care Everywhere, last office visit with Dr. Tedd Sias was 09/13/2019 and per note "He reports that he takes glargine pen 40 units qhs and Humalog at meals based on a 1:5 insulin to carbohydrate ratio plus correction Humalog of 1 per 20 mg/dl over a target of 297 (typical dose 8-14 units  qAC)."  Patient presented to Emergency Room today with AMS, shortness of breath, reported recently dx with COVID, and glucose of 327 mg/dl at 9:89 am today. Labs indicate mild acidosis and patient received Novolog 5 units at 9:38 am.  Per chart, patient is currently on BiPAP. Will follow up with patient when appropriate.  Thanks, Orlando Penner, RN, MSN, CDE Diabetes Coordinator Inpatient Diabetes Program [phone] (Team Pager from 8am to 5pm)

## 2020-06-13 NOTE — Progress Notes (Signed)
Notified bedside nurse of need to draw repeat lactic acid @  0720.

## 2020-06-13 NOTE — H&P (Signed)
CRITICAL CARE NOTE    CC  Acute resp failure  HPI 49 y.o. male with below list of previous medical conditions including diabetes mellitus, hypertension, recent diagnosis of COVID-19 on 06/04/2020 presents to the emergency department via private vehicle that he was assisted out of secondary to apparent respiratory difficulty and altered mental status. Patient remains critically ill Prognosis is guarded  Dx with COVID 10/26 Admitted to ICU on biPAP and mental status changes   Active Ambulatory Problems    Diagnosis Date Noted  . Hypertension   . Type I diabetes mellitus with manifestations, uncontrolled (HCC)   . GERD (gastroesophageal reflux disease)   . Diabetic, retinopathy, proliferative (HCC) 05/28/2015  . Diabetes mellitus type 1 with peripheral artery disease (HCC) 05/28/2015  . Hypercholesterolemia 10/22/2015  . Nuclear sclerotic cataract 07/05/2013  . Type 1 diabetes mellitus (HCC) 05/29/2013  . Adhesive capsulitis 12/17/2015  . Insomnia 05/28/2016  . Anxiety 04/14/2019  . Upper respiratory tract infection 06/03/2020   Resolved Ambulatory Problems    Diagnosis Date Noted  . Hyperlipidemia   . Abdominal pain 07/23/2015  . Sinusitis 07/23/2015   Past Medical History:  Diagnosis Date  . Anginal pain (HCC)   . Blindness of left eye   . Diabetes mellitus without complication [facility])    Past Surgical History:  Procedure Laterality Date  . APPENDECTOMY    . CARPAL TUNNEL RELEASE Bilateral   . cataract surgery Bilateral 2014  . EYE SURGERY Left   . LEFT HEART CATH AND CORONARY ANGIOGRAPHY Left 05/22/2019   Procedure: LEFT HEART CATH AND CORONARY ANGIOGRAPHY;  Surgeon: Alwyn Pea, MD;  Location: [facility] INVASIVE CV LAB;  Service: Cardiovascular;  Laterality: Left;  . SHOULDER SURGERY Left 2009   Social History   Socioeconomic History  . Marital status: Divorced    Spouse name: Not on file  . Number of children: Not on file  . Years of education: Not on file   . Highest education level: Not on file  Occupational History  . Not on file  Tobacco Use  . Smoking status: Never Smoker  . Smokeless tobacco: Never Used  Vaping Use  . Vaping Use: Never used  Substance and Sexual Activity  . Alcohol use: Not Currently    Comment: socially  . Drug use: No  . Sexual activity: Not on file  Other Topics Concern  . Not on file  Social History Narrative  . Not on file   Social Determinants of Health   Financial Resource Strain:   . Difficulty of Paying Living Expenses: Not on file  Food Insecurity:   . Worried About Programme researcher, broadcasting/film/video in the Last Year: Not on file  . Ran Out of Food in the Last Year: Not on file  Transportation Needs:   . Lack of Transportation (Medical): Not on file  . Lack of Transportation (Non-Medical): Not on file  Physical Activity:   . Days of Exercise per Week: Not on file  . Minutes of Exercise per Session: Not on file  Stress:   . Feeling of Stress : Not on file  Social Connections:   . Frequency of Communication with Friends and Family: Not on file  . Frequency of Social Gatherings with Friends and Family: Not on file  . Attends Religious Services: Not on file  . Active Member of Clubs or Organizations: Not on file  . Attends Banker Meetings: Not on file  . Marital Status: Not on file  Intimate Partner  Violence:   . Fear of Current or Ex-Partner: Not on file  . Emotionally Abused: Not on file  . Physically Abused: Not on file  . Sexually Abused: Not on file    BP 124/67 (BP Location: Left Arm)   Pulse (!) 112   Temp (S) (!) 101.3 F (38.5 C) (Axillary) Comment: MD notified - PRN Tylenol for temp >101.5  Resp (!) 21   Ht 5\' 9"  (1.753 m)   Wt 100 kg   SpO2 100%   BMI 32.56 kg/m    I/O last 3 completed shifts: In: -  Out: 1700 [Urine:1700] Total I/O In: 1587.8 [I.V.:937.8; IV Piggyback:650] Out: 0   SpO2: 100 % O2 Flow Rate (L/min): 40 L/min (per spo2) FiO2 (%): 40 % (Per  spo2)  Estimated body mass index is 32.56 kg/m as calculated from the following:   Height as of this encounter: 5\' 9"  (1.753 m).   Weight as of this encounter: 100 kg.  SIGNIFICANT EVENTS   REVIEW OF SYSTEMS  PATIENT IS UNABLE TO PROVIDE COMPLETE REVIEW OF SYSTEMS DUE TO SEVERE CRITICAL ILLNESS      COVID-19 DISASTER DECLARATION:   FULL CONTACT PHYSICAL EXAMINATION WAS NOT POSSIBLE DUE TO TREATMENT OF COVID-19  AND CONSERVATION OF PERSONAL PROTECTIVE EQUIPMENT, LIMITED EXAM FINDINGS INCLUDE-   PHYSICAL EXAMINATION:  GENERAL:critically ill appearing, +resp distress NEUROLOGIC: obtunded, GCS<8   Patient assessed or the symptoms described in the history of present illness.  In the context of the Global COVID-19 pandemic, which necessitated consideration that the patient might be at risk for infection with the SARS-CoV-2 virus that causes COVID-19, Institutional protocols and algorithms that pertain to the evaluation of patients at risk for COVID-19 are in a state of rapid change based on information released by regulatory bodies including the CDC and federal and state organizations. These policies and algorithms were followed during the patient's care while in hospital.    MEDICATIONS: I have reviewed all medications and confirmed regimen as documented   CULTURE RESULTS   Recent Results (from the past 240 hour(s))  MRSA PCR Screening     Status: None   Collection Time: 06/13/20  9:32 AM   Specimen: Nasal Mucosa; Nasopharyngeal  Result Value Ref Range Status   MRSA by PCR NEGATIVE NEGATIVE Final    Comment:        The GeneXpert MRSA Assay (FDA approved for NASAL specimens only), is one component of a comprehensive MRSA colonization surveillance program. It is not intended to diagnose MRSA infection nor to guide or monitor treatment for MRSA infections. Performed at [facility], [address], [place], [address] [place]           IMAGING    DG  Chest Port 1 View  Result Date: 06/13/2020 CLINICAL DATA:  Evaluate for sepsis. EXAM: PORTABLE CHEST 1 VIEW COMPARISON:  06/05/2020 FINDINGS: Previous median sternotomy and CABG procedure. Stable cardiomediastinal contours. Interval increase in diffuse, bilateral interstitial and airspace densities. No lobar consolidation. Visualized osseous structures are unremarkable. IMPRESSION: Interval increase in bilateral interstitial and airspace densities compatible with worsening pulmonary edema versus multifocal infection. Electronically Signed   By: 13/11/2019 M.D.   On: 06/13/2020 06:01     Nutrition Status:           ASSESSMENT AND PLAN SYNOPSIS  Acute hypoxemic respiratory failure due to COVID-19 pneumonia / ARDS  HIGH RISK FOR INTUBATION HIGH RISK FOR CARDIAC ARRESR   Morbid obesity, possible OSA.   Will certainly  impact respiratory mechanics   ACUTE KIDNEY INJURY/Renal Failure -continue Foley Catheter-assess need -Avoid nephrotoxic agents -Follow urine output, BMP -Ensure adequate renal perfusion, optimize oxygenation -Renal dose medications     NEUROLOGY Acute toxic metabolic encephalopathy-COVID ENCEPHALITIS, CHECK UDS   CARDIAC ICU monitoring  GI GI PROPHYLAXIS as indicated   ENDO - will use ICU hypoglycemic\Hyperglycemia protocol if indicated     ELECTROLYTES -follow labs as needed -replace as needed -pharmacy consultation and following   DVT/GI PRX ordered and assessed TRANSFUSIONS AS NEEDED MONITOR FSBS I Assessed the need for Labs I Assessed the need for Foley I Assessed the need for Central Venous Line Family Discussion when available I Assessed the need for Mobilization I made an Assessment of medications to be adjusted accordingly Safety Risk assessment completed   CASE DISCUSSED IN MULTIDISCIPLINARY ROUNDS WITH ICU TEAM  Critical Care Time devoted to patient care services described in this note is 75 minutes.   Overall, patient  is critically ill, prognosis is guarded.  Patient with Multiorgan failure and at high risk for cardiac arrest and death.    Lucie Leather, M.D.  [facility]  Medical Director [facility] Medical Director [facility] Cardio-Pulmonary Department

## 2020-06-13 NOTE — Progress Notes (Signed)
CODE SEPSIS - PHARMACY COMMUNICATION  **Broad Spectrum Antibiotics should be administered within 1 hour of Sepsis diagnosis**  Time Code Sepsis Called/Page Received:  11/4 @ 0524  Antibiotics Ordered: Ceftriaxone, Azithromycin  Time of 1st antibiotic administration:  Azithromycin @ 0551.   Additional action taken by pharmacy:   If necessary, Name of Provider/Nurse Contacted:     Nariah Morgano D ,PharmD Clinical Pharmacist  06/13/2020  6:19 AM

## 2020-06-14 ENCOUNTER — Inpatient Hospital Stay: Payer: Medicare Other

## 2020-06-14 ENCOUNTER — Inpatient Hospital Stay
Admit: 2020-06-14 | Discharge: 2020-06-14 | Disposition: A | Discharge status: Home / Self Care | Payer: Medicare Other | Attending: Internal Medicine | Admitting: Internal Medicine

## 2020-06-14 DIAGNOSIS — J9600 Acute respiratory failure, unspecified whether with hypoxia or hypercapnia: Secondary | ICD-10-CM | POA: Diagnosis not present

## 2020-06-14 LAB — GLUCOSE, CAPILLARY
Glucose-Capillary: 163 mg/dL — ABNORMAL HIGH (ref 70–99)
Glucose-Capillary: 191 mg/dL — ABNORMAL HIGH (ref 70–99)
Glucose-Capillary: 237 mg/dL — ABNORMAL HIGH (ref 70–99)
Glucose-Capillary: 291 mg/dL — ABNORMAL HIGH (ref 70–99)
Glucose-Capillary: 244 mg/dL — ABNORMAL HIGH (ref 70–99)

## 2020-06-14 LAB — CBC WITH DIFFERENTIAL/PLATELET
Abs Immature Granulocytes: 0.23 10*3/uL — ABNORMAL HIGH (ref 0.00–0.07)
Basophils Absolute: 0 10*3/uL (ref 0.0–0.1)
Basophils Relative: 1 %
Eosinophils Absolute: 0 10*3/uL (ref 0.0–0.5)
Eosinophils Relative: 0 %
HCT: 30.3 % — ABNORMAL LOW (ref 39.0–52.0)
Hemoglobin: 10.1 g/dL — ABNORMAL LOW (ref 13.0–17.0)
Immature Granulocytes: 3 %
Lymphocytes Relative: 8 %
Lymphs Abs: 0.7 10*3/uL (ref 0.7–4.0)
MCH: 25.5 pg — ABNORMAL LOW (ref 26.0–34.0)
MCHC: 33.3 g/dL (ref 30.0–36.0)
MCV: 76.5 fL — ABNORMAL LOW (ref 80.0–100.0)
Monocytes Absolute: 0.7 10*3/uL (ref 0.1–1.0)
Monocytes Relative: 8 %
Neutro Abs: 7 10*3/uL (ref 1.7–7.7)
Neutrophils Relative %: 80 %
Platelets: 404 10*3/uL — ABNORMAL HIGH (ref 150–400)
RBC: 3.96 MIL/uL — ABNORMAL LOW (ref 4.22–5.81)
RDW: 14.1 % (ref 11.5–15.5)
WBC: 8.6 10*3/uL (ref 4.0–10.5)
nRBC: 0 % (ref 0.0–0.2)

## 2020-06-14 LAB — PROCALCITONIN: Procalcitonin: 0.6 ng/mL

## 2020-06-14 LAB — COMPREHENSIVE METABOLIC PANEL
ALT: 19 U/L (ref 0–44)
AST: 21 U/L (ref 15–41)
Albumin: 2.4 g/dL — ABNORMAL LOW (ref 3.5–5.0)
Alkaline Phosphatase: 96 U/L (ref 38–126)
Anion gap: 9 (ref 5–15)
BUN: 33 mg/dL — ABNORMAL HIGH (ref 6–20)
CO2: 22 mmol/L (ref 22–32)
Calcium: 8.9 mg/dL (ref 8.9–10.3)
Chloride: 103 mmol/L (ref 98–111)
Creatinine, Ser: 1.31 mg/dL — ABNORMAL HIGH (ref 0.61–1.24)
GFR, Estimated: >60 mL/min (ref >60)
Glucose, Bld: 191 mg/dL — ABNORMAL HIGH (ref 70–99)
Potassium: 4.2 mmol/L (ref 3.5–5.1)
Sodium: 134 mmol/L — ABNORMAL LOW (ref 135–145)
Total Bilirubin: 0.5 mg/dL (ref 0.3–1.2)
Total Protein: 6.1 g/dL — ABNORMAL LOW (ref 6.5–8.1)

## 2020-06-14 LAB — BLOOD GAS, ARTERIAL
Acid-base deficit: 0.2 mmol/L (ref 0.0–2.0)
Bicarbonate: 23.9 mmol/L (ref 20.0–28.0)
FIO2: 0.4
O2 Saturation: 94.9 %
Patient temperature: 37
pCO2 arterial: 36 mmHg (ref 32.0–48.0)
pH, Arterial: 7.43 (ref 7.350–7.450)
pO2, Arterial: 73 mmHg — ABNORMAL LOW (ref 83.0–108.0)

## 2020-06-14 LAB — FERRITIN: Ferritin: 177 ng/mL (ref 24–336)

## 2020-06-14 LAB — SODIUM: Sodium: 140 mmol/L (ref 135–145)

## 2020-06-14 LAB — URINE CULTURE: Culture: NO GROWTH

## 2020-06-14 LAB — HIV ANTIBODY (ROUTINE TESTING W REFLEX): HIV Screen 4th Generation wRfx: NONREACTIVE

## 2020-06-14 LAB — C-REACTIVE PROTEIN: CRP: 16.8 mg/dL — ABNORMAL HIGH (ref <1.0)

## 2020-06-14 LAB — FIBRIN DERIVATIVES D-DIMER (ARMC ONLY): Fibrin derivatives D-dimer (ARMC): 2521.25 ng/mL (FEU) — ABNORMAL HIGH (ref 0.00–499.00)

## 2020-06-14 MED ORDER — ENOXAPARIN SODIUM 60 MG/0.6ML ~~LOC~~ SOLN
0.5000 mg/kg | SUBCUTANEOUS | Status: DC
  Administered 2020-06-14 – 2020-06-20 (×7): 50 mg via SUBCUTANEOUS
  Filled 2020-06-14 (×7): qty 0.6

## 2020-06-14 MED ORDER — DEXTROSE IN LACTATED RINGERS 5 % IV SOLN: INTRAVENOUS | Status: DC

## 2020-06-14 MED ORDER — INSULIN GLARGINE 100 UNIT/ML ~~LOC~~ SOLN
25.0000 [IU] | Freq: Every day | SUBCUTANEOUS | Status: DC
  Administered 2020-06-15 – 2020-06-19 (×5): 25 [IU] via SUBCUTANEOUS
  Filled 2020-06-14 (×6): qty 0.25

## 2020-06-14 MED ORDER — LACTATED RINGERS IV SOLN: INTRAVENOUS | Status: DC

## 2020-06-14 MED ORDER — LORAZEPAM 2 MG/ML IJ SOLN
INTRAMUSCULAR | Status: AC
  Administered 2020-06-14: 2 mg via INTRAVENOUS
  Filled 2020-06-14: qty 1

## 2020-06-14 MED ORDER — MIDAZOLAM HCL 2 MG/2ML IJ SOLN
INTRAMUSCULAR | Status: AC
  Administered 2020-06-14: 2 mg
  Filled 2020-06-14: qty 2

## 2020-06-14 MED ORDER — AMITRIPTYLINE HCL 25 MG PO TABS
100.0000 mg | ORAL_TABLET | Freq: Every day | ORAL | Status: DC
  Administered 2020-06-17 – 2020-06-19 (×3): 100 mg via ORAL
  Filled 2020-06-14: qty 1
  Filled 2020-06-14: qty 4
  Filled 2020-06-14 (×3): qty 1
  Filled 2020-06-14: qty 4
  Filled 2020-06-14: qty 1

## 2020-06-14 MED ORDER — MIDAZOLAM HCL 2 MG/2ML IJ SOLN
1.0000 mg | INTRAMUSCULAR | Status: DC | PRN
  Administered 2020-06-14 (×2): 4 mg via INTRAVENOUS
  Filled 2020-06-14 (×2): qty 4

## 2020-06-14 MED ORDER — FENTANYL CITRATE (PF) 100 MCG/2ML IJ SOLN
25.0000 ug | INTRAMUSCULAR | Status: DC | PRN
  Administered 2020-06-14 (×2): 100 ug via INTRAVENOUS
  Administered 2020-06-15: 50 ug via INTRAVENOUS
  Administered 2020-06-17: 100 ug via INTRAVENOUS
  Filled 2020-06-14 (×4): qty 2

## 2020-06-14 MED ORDER — LORAZEPAM 2 MG/ML IJ SOLN
0.5000 mg | INTRAMUSCULAR | Status: DC | PRN
  Administered 2020-06-14 – 2020-06-15 (×3): 2 mg via INTRAVENOUS
  Administered 2020-06-18: 21:00:00 1 mg via INTRAVENOUS
  Filled 2020-06-14 (×4): qty 1

## 2020-06-14 MED ORDER — INSULIN ASPART 100 UNIT/ML ~~LOC~~ SOLN
0.0000 [IU] | SUBCUTANEOUS | Status: DC
  Administered 2020-06-14: 5 [IU] via SUBCUTANEOUS
  Administered 2020-06-14: 8 [IU] via SUBCUTANEOUS
  Filled 2020-06-14 (×2): qty 1

## 2020-06-14 MED ORDER — FENTANYL CITRATE (PF) 100 MCG/2ML IJ SOLN
INTRAMUSCULAR | Status: AC
  Administered 2020-06-14: 25 ug via INTRAVENOUS
  Filled 2020-06-14: qty 2

## 2020-06-14 MED ORDER — INSULIN ASPART 100 UNIT/ML ~~LOC~~ SOLN
0.0000 [IU] | SUBCUTANEOUS | Status: DC
  Administered 2020-06-15 – 2020-06-16 (×7): 4 [IU] via SUBCUTANEOUS
  Administered 2020-06-16: 3 [IU] via SUBCUTANEOUS
  Administered 2020-06-16: 4 [IU] via SUBCUTANEOUS
  Administered 2020-06-17 (×2): 7 [IU] via SUBCUTANEOUS
  Administered 2020-06-17: 4 [IU] via SUBCUTANEOUS
  Administered 2020-06-17 (×2): 7 [IU] via SUBCUTANEOUS
  Administered 2020-06-18: 4 [IU] via SUBCUTANEOUS
  Administered 2020-06-18: 3 [IU] via SUBCUTANEOUS
  Administered 2020-06-18: 4 [IU] via SUBCUTANEOUS
  Filled 2020-06-14 (×9): qty 1

## 2020-06-14 MED ORDER — GABAPENTIN 300 MG PO CAPS
300.0000 mg | ORAL_CAPSULE | Freq: Two times a day (BID) | ORAL | Status: DC
  Administered 2020-06-14 – 2020-06-20 (×9): 300 mg via ORAL
  Filled 2020-06-14 (×10): qty 1

## 2020-06-14 NOTE — Progress Notes (Addendum)
CRITICAL CARE NOTE    CC  Acute resp failure  HPI 49 y.o. male with below list of previous medical conditions including diabetes mellitus, hypertension, recent diagnosis of COVID-19 on 06/04/2020 presents to the emergency department via private vehicle that he was assisted out of secondary to apparent respiratory difficulty and altered mental status. Patient remains critically ill Prognosis is guarded  Dx with COVID 10/26 Admitted to ICU on biPAP and mental status changes   has a past medical history of Adhesive capsulitis, Anginal pain (HCC), Blindness of left eye, Diabetes mellitus without complication (HCC), Diabetic, retinopathy, proliferative (HCC), GERD (gastroesophageal reflux disease), Hyperlipidemia, and Hypertension.   has a past surgical history that includes Appendectomy; Carpal tunnel release (Bilateral); Eye surgery (Left); cataract surgery (Bilateral, 2014); Shoulder surgery (Left, 2009); and LEFT HEART CATH AND CORONARY ANGIOGRAPHY (Left, 05/22/2019).    MICRO   Recent Results (from the past 240 hour(s))  Blood Culture (routine x 2)     Status: None (Preliminary result)   Collection Time: 06/13/20  5:29 AM   Specimen: BLOOD  Result Value Ref Range Status   Specimen Description BLOOD RIGHT ANTECUBITAL  Final   Special Requests   Final    BOTTLES DRAWN AEROBIC AND ANAEROBIC Blood Culture adequate volume   Culture   Final    NO GROWTH 1 DAY Performed at [facility], [address], [place], [place] [address]    Report Status PENDING  Incomplete  Blood Culture (routine x 2)     Status: None (Preliminary result)   Collection Time: 06/13/20  5:29 AM   Specimen: BLOOD  Result Value Ref Range Status   Specimen Description BLOOD RIGHT ANTECUBITAL  Final   Special Requests   Final    BOTTLES DRAWN AEROBIC AND ANAEROBIC Blood Culture adequate volume   Culture   Final    NO GROWTH 1 DAY Performed at [facility], [address] [place],  [place] [address]    Report Status PENDING  Incomplete  MRSA PCR Screening     Status: None   Collection Time: 06/13/20  9:32 AM   Specimen: Nasal Mucosa; Nasopharyngeal  Result Value Ref Range Status   MRSA by PCR NEGATIVE NEGATIVE Final    Comment:        The GeneXpert MRSA Assay (FDA approved for NASAL specimens only), is one component of a comprehensive MRSA colonization surveillance program. It is not intended to diagnose MRSA infection nor to guide or monitor treatment for MRSA infections. Performed at [facility], [address] [place], [place] [address]       ABX   Anti-infectives (From admission, onward)   Start     Dose/Rate Route Frequency Ordered Stop   06/14/20 1000  remdesivir 100 mg in sodium chloride 0.9 % 100 mL IVPB       "Followed by" Linked Group Details   100 mg 200 mL/hr over 30 Minutes Intravenous Daily 06/13/20 0621 06/18/20 0959   06/13/20 0630  remdesivir 200 mg in sodium chloride 0.9% 250 mL IVPB       "Followed by" Linked Group Details   200 mg 580 mL/hr over 30 Minutes Intravenous Once 06/13/20 0621 06/13/20 0915   06/13/20 0530  cefTRIAXone (ROCEPHIN) 2 g in sodium chloride 0.9 % 100 mL IVPB        2 g 200 mL/hr over 30 Minutes Intravenous Every 24 hours 06/13/20 0525     06/13/20 0530  azithromycin (ZITHROMAX) 500 mg in sodium  chloride 0.9 % 250 mL IVPB        500 mg 250 mL/hr over 60 Minutes Intravenous Every 24 hours 06/13/20 0525           EVENTS   06/13/2020 - admit    SUBJECTIVE/OVERNIGHT/INTERVAL HX      06/14/2020 - non HHFN. Not on vent.OFf biopap./ On max precedex. Aigtated briefly and ripped out IV. - RN gives hx of chronic pain and his UDS positive for TCAD (he is on this with gabapentin) and opioids (filled out 10/27 cough syrup with codeine) but opioids not listed as pain med  AKI is improving     BP (!) 142/64   Pulse 90   Temp (!) 96.8 F (36 C) (Axillary)   Resp (!) 24   Ht 5\' 9"  (1.753 m)   Wt 100  kg   SpO2 100%   BMI 32.56 kg/m    I/O last 3 completed shifts: In: 3636.1 [I.V.:2986.1; IV Piggyback:650] Out: 6325 [Urine:6325] No intake/output data recorded.  SpO2: 100 % O2 Flow Rate (L/min): 40 L/min FiO2 (%): 40 %  Estimated body mass index is 32.56 kg/m as calculated from the following:   Height as of this encounter: 5\' 9"  (1.753 m).   Weight as of this encounter: 100 kg.     General: No distress. RASS -1 after fent prn and versed prn and on precedex gtt Neuro: Alert and Oriented x 3. GCS 15. Speech norma Psych: Pleasant at this moment Resp:  Barrel Chest -no  Wheeze - no, Crackles - no, Happy hyppoxemia S: Normal heart sounds. Murmurs - no Ext: Stigmata of Connective Tissue Disease - no HEENT: Normal upper airway. PEERL +. No post nasal drip. On HHFNC   LABS    PULMONARY Recent Labs  Lab 06/13/20 0550 06/14/20 0347  PHART  --  7.43  PCO2ART  --  36  PO2ART  --  73*  HCO3 21.6 23.9  O2SAT 34.5 94.9    CBC Recent Labs  Lab 06/13/20 0529 06/14/20 0532  HGB 10.7* 10.1*  HCT 31.2* 30.3*  WBC 8.8 8.6  PLT 407* 404*    COAGULATION Recent Labs  Lab 06/13/20 0529  INR 1.2    CARDIAC  No results for input(s): TROPONINI in the last 168 hours. No results for input(s): PROBNP in the last 168 hours.   CHEMISTRY Recent Labs  Lab 06/13/20 0529 06/14/20 0541  NA 119* 134*  K 4.2 4.2  CL 85* 103  CO2 19* 22  GLUCOSE 327* 191*  BUN 61* 33*  CREATININE 3.50* 1.31*  CALCIUM 8.6* 8.9   Estimated Creatinine Clearance: 77.8 mL/min (A) (by C-G formula based on SCr of 1.31 mg/dL (H)).   LIVER Recent Labs  Lab 06/13/20 0529 06/14/20 0541  AST 30 21  ALT 24 19  ALKPHOS 109 96  BILITOT 0.9 0.5  PROT 7.1 6.1*  ALBUMIN 3.0* 2.4*  INR 1.2  --      INFECTIOUS Recent Labs  Lab 06/13/20 0529 06/13/20 0734  LATICACIDVEN 3.5* 1.6  PROCALCITON 5.07  --      ENDOCRINE CBG (last 3)  Recent Labs    06/13/20 2357 06/14/20 0343  06/14/20 0729  GLUCAP 149* 163* 191*         IMAGING x48h  - image(s) personally visualized  -   highlighted in bold DG Chest Port 1 View  Result Date: 06/14/2020 CLINICAL DATA:  Difficulty breathing with cough and history of COVID EXAM: PORTABLE CHEST  1 VIEW COMPARISON:  Yesterday FINDINGS: Cardiomegaly. Prior CABG and left atrial clipping. Diffuse interstitial and airspace opacity. No visible effusion or air leak. Remote rib fractures. IMPRESSION: Bilateral pulmonary opacity correlating with history of COVID. There is a prominent interstitial component with cardiomegaly, possible superimposed edema. Electronically Signed   By: Marnee Spring M.D.   On: 06/14/2020 04:59   DG Chest Port 1 View  Result Date: 06/13/2020 CLINICAL DATA:  Evaluate for sepsis. EXAM: PORTABLE CHEST 1 VIEW COMPARISON:  06/05/2020 FINDINGS: Previous median sternotomy and CABG procedure. Stable cardiomediastinal contours. Interval increase in diffuse, bilateral interstitial and airspace densities. No lobar consolidation. Visualized osseous structures are unremarkable. IMPRESSION: Interval increase in bilateral interstitial and airspace densities compatible with worsening pulmonary edema versus multifocal infection. Electronically Signed   By: Signa Kell M.D.   On: 06/13/2020 06:01        ASSESSMENT AND PLAN   Acute hypoxemic respiratory failure due to COVID-19 pneumonia / ARDS HIGH RISK FOR INTUBATION  06/14/2020 - on HHFNC and offf bipap. Looks "stable"  Plan  - bipap mandated qhs + day time 2h on with 4h off (Based on recovery trial)  - HHFNC  - goal pulse ox > 88%    ACUTE KIDNEY INJURY/Renal Failure  06/14/2020 - improving rapdily. On LR at 75cc  Planm - change fluids to D5LR at 50cc/h -continue Foley Catheter-assess need -Avoid nephrotoxic agents -Follow urine output, BMP -Ensure adequate renal perfusion, optimize oxygenation -Renal dose medications     NEUROLOGY Acute toxic  metabolic encephalopathy-COVID ENCEPHALITIS,   06/14/2020 - intermittent agitation. Needing PRecedex gtt  Plan  - add versed prn  - add fent prn  - restart home TCAD  - restart home gabapentin - monitor QTc   ID  - covid.  06/14/2020 - no evidence of bacterial infection yet. Not on Actemra/Barcitinib due to high PCT  Plan  - dc abx - check urine leg and urine step - on soumedrol 40mg  IV Bid - recheck PCT and then decide on anti cytokine - check COVID IgG - check Quantifgeron gold  - check ECHO   T1DM - since age 29  06/14/2020 - sugars < 180  Plan  - ICU hyperglycemia protcol  - check HgbA1c   Best practice:  Diet:  NPO but medd Pain/Anxiety/Delirium protocol (if indicated): precedes VAP protocol (if indicated): na DVT prophylaxis: lovenox 50mg  q24h  GI prophylaxis: h2 blockace Glucose control: ssi Mobility: bed rest Code Status: full Family Communication: called HEather Murph listed [phone]. ->  updated Disposition: icu     ATTESTATION & SIGNATURE   The patient LENZIE SANDLER is critically ill with multiple organ systems failure and requires high complexity decision making for assessment and support, frequent evaluation and titration of therapies, application of advanced monitoring technologies and extensive interpretation of multiple databases.   Critical Care Time devoted to patient care services described in this note is  45  Minutes. This time reflects time of care of this signee Dr [phone]. This critical care time does not reflect procedure time, or teaching time or supervisory time of PA/NP/Med student/Med Resident etc but could involve care discussion time     Dr. Mellody Drown, M.D., [facility]C.P Pulmonary and Critical Care Medicine Staff Physician [facility] Pager: [phone], If no answer or between  15:00h - 7:00h: call [phone]  06/14/2020 8:38 AM

## 2020-06-14 NOTE — Progress Notes (Addendum)
Inpatient Diabetes Program Recommendations  AACE/ADA: New Consensus Statement on Inpatient Glycemic Control (2015)  Target Ranges:  Prepandial:   less than 140 mg/dL      Peak postprandial:   less than 180 mg/dL (1-2 hours)      Critically ill patients:  140 - 180 mg/dL    Results for Mike Kline, Mike Kline (MRN [id number]) as of 06/14/2020 12:26  Ref. Range 06/13/2020 05:19 06/13/2020 09:25 06/13/2020 11:49 06/13/2020 15:31 06/13/2020 19:42  Glucose-Capillary Latest Ref Range: 70 - 99 mg/dL 622 (H) 297 (H)  5 units NOVOLOG  258 (H)  5 units NOVOLOG  141 (H)  1 unit NOVOLOG  20 units LANTUS @3 :50pm  153 (H)  2 units NOVOLOG    Results for Mike Kline, Mike Kline (MRN Mellody Drown) as of 06/14/2020 12:26  Ref. Range 06/13/2020 23:57 06/14/2020 03:43 06/14/2020 07:29 06/14/2020 12:11  Glucose-Capillary Latest Ref Range: 70 - 99 mg/dL 13/12/2019 (H)  1 unit NOVOLOG  163 (H)  2 units NOVOLOG  191 (H)  2 units NOVOLOG +  20 units LANTUS @9 :37am  237 (H)  3 units NOVOLOG    Diabetes history: DM1 (makes NO insulin; requires basal, correction, and carbohydrate coverage insulin)  Home DM Meds: Lantus 40 units QHS       Humalog TID with meals (1 unit per 5 grams of carbs; 1 unit for every 20 mg/dl above target glucose of 120 mg/dl)  Current Orders: Lantus 20 units Daily      Novolog Sensitive Correction Scale/ SSI (0-9 units) Q4 hours     MD- While patient getting steroids, please consider:  1. Increase the Novolog SSi to the Moderate scale (0-15 units) Q4 hours  2. Increase the Lantus slightly to 25 units Daily     --Will follow patient during hospitalization--  740 RN, MSN, CDE Diabetes Coordinator Inpatient Glycemic Control Team Team Pager: [phone] (8a-5p)

## 2020-06-14 NOTE — Progress Notes (Signed)
PHARMACIST - PHYSICIAN COMMUNICATION  CONCERNING:  Enoxaparin (Lovenox) for DVT Prophylaxis    RECOMMENDATION: Patient was prescribed enoxaparin 40mg  q24 hours for VTE prophylaxis.   Filed Weights   06/13/20 0930  Weight: 100 kg (220 lb 7.4 oz)    Body mass index is 32.56 kg/m.  Estimated Creatinine Clearance: 77.8 mL/min (A) (by C-G formula based on SCr of 1.31 mg/dL (H)).   Based on [facility] policy patient is candidate for enoxaparin 0.5mg /kg TBW SQ every 24 hours based on BMI being >30.  DESCRIPTION: Pharmacy has adjusted enoxaparin dose per [facility] policy.  Renal function has improved today  Patient is now receiving enoxaparin 50 mg every 24 hours   [facility] 06/14/2020 8:34 AM

## 2020-06-14 NOTE — Progress Notes (Signed)
*  PRELIMINARY RESULTS* Echocardiogram 2D Echocardiogram has been performed.  Cristela Blue 06/14/2020, 2:35 PM

## 2020-06-15 DIAGNOSIS — G9340 Encephalopathy, unspecified: Secondary | ICD-10-CM

## 2020-06-15 DIAGNOSIS — J9601 Acute respiratory failure with hypoxia: Secondary | ICD-10-CM | POA: Diagnosis not present

## 2020-06-15 LAB — STREP PNEUMONIAE URINARY ANTIGEN: Strep Pneumo Urinary Antigen: NEGATIVE

## 2020-06-15 LAB — CBC WITH DIFFERENTIAL/PLATELET
Abs Immature Granulocytes: 0.61 10*3/uL — ABNORMAL HIGH (ref 0.00–0.07)
Basophils Absolute: 0.1 10*3/uL (ref 0.0–0.1)
Basophils Relative: 1 %
Eosinophils Absolute: 0 10*3/uL (ref 0.0–0.5)
Eosinophils Relative: 0 %
HCT: 34.8 % — ABNORMAL LOW (ref 39.0–52.0)
Hemoglobin: 11.5 g/dL — ABNORMAL LOW (ref 13.0–17.0)
Immature Granulocytes: 5 %
Lymphocytes Relative: 7 %
Lymphs Abs: 0.9 10*3/uL (ref 0.7–4.0)
MCH: 25.2 pg — ABNORMAL LOW (ref 26.0–34.0)
MCHC: 33 g/dL (ref 30.0–36.0)
MCV: 76.1 fL — ABNORMAL LOW (ref 80.0–100.0)
Monocytes Absolute: 1 10*3/uL (ref 0.1–1.0)
Monocytes Relative: 7 %
Neutro Abs: 10.9 10*3/uL — ABNORMAL HIGH (ref 1.7–7.7)
Neutrophils Relative %: 80 %
Platelets: 456 10*3/uL — ABNORMAL HIGH (ref 150–400)
RBC: 4.57 MIL/uL (ref 4.22–5.81)
RDW: 14.5 % (ref 11.5–15.5)
WBC: 13.5 10*3/uL — ABNORMAL HIGH (ref 4.0–10.5)
nRBC: 0 % (ref 0.0–0.2)

## 2020-06-15 LAB — COMPREHENSIVE METABOLIC PANEL
ALT: 16 U/L (ref 0–44)
AST: 15 U/L (ref 15–41)
Albumin: 2.2 g/dL — ABNORMAL LOW (ref 3.5–5.0)
Alkaline Phosphatase: 92 U/L (ref 38–126)
Anion gap: 8 (ref 5–15)
BUN: 33 mg/dL — ABNORMAL HIGH (ref 6–20)
CO2: 24 mmol/L (ref 22–32)
Calcium: 8.9 mg/dL (ref 8.9–10.3)
Chloride: 104 mmol/L (ref 98–111)
Creatinine, Ser: 0.96 mg/dL (ref 0.61–1.24)
GFR, Estimated: >60 mL/min (ref >60)
Glucose, Bld: 191 mg/dL — ABNORMAL HIGH (ref 70–99)
Potassium: 4.3 mmol/L (ref 3.5–5.1)
Sodium: 136 mmol/L (ref 135–145)
Total Bilirubin: 0.4 mg/dL (ref 0.3–1.2)
Total Protein: 6 g/dL — ABNORMAL LOW (ref 6.5–8.1)

## 2020-06-15 LAB — HEMOGLOBIN A1C
Hgb A1c MFr Bld: 10.4 % — ABNORMAL HIGH (ref 4.8–5.6)
Mean Plasma Glucose: 251.78 mg/dL

## 2020-06-15 LAB — C-REACTIVE PROTEIN: CRP: 8.9 mg/dL — ABNORMAL HIGH (ref <1.0)

## 2020-06-15 LAB — LEGIONELLA PNEUMOPHILA SEROGP 1 UR AG: L. pneumophila Serogp 1 Ur Ag: NEGATIVE

## 2020-06-15 LAB — PHOSPHORUS: Phosphorus: 3 mg/dL (ref 2.5–4.6)

## 2020-06-15 LAB — PROCALCITONIN: Procalcitonin: 0.44 ng/mL

## 2020-06-15 LAB — GLUCOSE, CAPILLARY
Glucose-Capillary: 115 mg/dL — ABNORMAL HIGH (ref 70–99)
Glucose-Capillary: 151 mg/dL — ABNORMAL HIGH (ref 70–99)
Glucose-Capillary: 163 mg/dL — ABNORMAL HIGH (ref 70–99)
Glucose-Capillary: 171 mg/dL — ABNORMAL HIGH (ref 70–99)
Glucose-Capillary: 188 mg/dL — ABNORMAL HIGH (ref 70–99)
Glucose-Capillary: 80 mg/dL (ref 70–99)

## 2020-06-15 LAB — SAR COV2 SEROLOGY (COVID19)AB(IGG),IA: SARS-CoV-2 Ab, IgG: REACTIVE — AB

## 2020-06-15 LAB — FERRITIN: Ferritin: 152 ng/mL (ref 24–336)

## 2020-06-15 LAB — MAGNESIUM: Magnesium: 2.3 mg/dL (ref 1.7–2.4)

## 2020-06-15 LAB — LACTIC ACID, PLASMA: Lactic Acid, Venous: 1.1 mmol/L (ref 0.5–1.9)

## 2020-06-15 LAB — FIBRIN DERIVATIVES D-DIMER (ARMC ONLY): Fibrin derivatives D-dimer (ARMC): 1006.96 ng/mL (FEU) — ABNORMAL HIGH (ref 0.00–499.00)

## 2020-06-15 MED ORDER — GUAIFENESIN-DM 100-10 MG/5ML PO SYRP
10.0000 mL | ORAL_SOLUTION | Freq: Four times a day (QID) | ORAL | Status: DC | PRN
  Administered 2020-06-18 – 2020-06-20 (×4): 10 mL via ORAL
  Filled 2020-06-15 (×4): qty 10

## 2020-06-15 NOTE — Progress Notes (Signed)
CRITICAL CARE NOTE Date of admit 06/13/2020 LOS : 2 days   BRIEF    49 y.o. male with below list of previous medical conditions including diabetes mellitus, hypertension, recent diagnosis of COVID-19 on 06/04/2020 presents to the emergency department via private vehicle that he was assisted out of secondary to apparent respiratory difficulty and altered mental status. Patient remains critically ill. Dx with COVID 10/26 Admitted to ICU on biPAP and mental status changes   has a past medical history of Adhesive capsulitis, Anginal pain (HCC), Blindness of left eye, Diabetes mellitus without complication (HCC), Diabetic, retinopathy, proliferative (HCC), GERD (gastroesophageal reflux disease), Hyperlipidemia, and Hypertension.   has a past surgical history that includes Appendectomy; Carpal tunnel release (Bilateral); Eye surgery (Left); cataract surgery (Bilateral, 2014); Shoulder surgery (Left, 2009); and LEFT HEART CATH AND CORONARY ANGIOGRAPHY (Left, 05/22/2019).    MICRO   Recent Results (from the past 240 hour(s))  Blood Culture (routine x 2)     Status: None (Preliminary result)   Collection Time: 06/13/20  5:29 AM   Specimen: BLOOD  Result Value Ref Range Status   Specimen Description BLOOD RIGHT ANTECUBITAL  Final   Special Requests   Final    BOTTLES DRAWN AEROBIC AND ANAEROBIC Blood Culture adequate volume   Culture   Final    NO GROWTH 2 DAYS Performed at [facility], [address], [place], [place] [address]    Report Status PENDING  Incomplete  Blood Culture (routine x 2)     Status: None (Preliminary result)   Collection Time: 06/13/20  5:29 AM   Specimen: BLOOD  Result Value Ref Range Status   Specimen Description BLOOD RIGHT ANTECUBITAL  Final   Special Requests   Final    BOTTLES DRAWN AEROBIC AND ANAEROBIC Blood Culture adequate volume   Culture   Final    NO GROWTH 2 DAYS Performed at [facility], [address], [place], [place]  [address]    Report Status PENDING  Incomplete  Urine culture     Status: None   Collection Time: 06/13/20  5:29 AM   Specimen: In/Out Cath Urine  Result Value Ref Range Status   Specimen Description   Final    IN/OUT CATH URINE Performed at [facility], [address], [place], [place] [address]    Special Requests   Final    NONE Performed at [facility], [address] [place], [place] [address]    Culture   Final    NO GROWTH Performed at [facility], [address] [place], [place] [address]    Report Status 06/14/2020 FINAL  Final  MRSA PCR Screening     Status: None   Collection Time: 06/13/20  9:32 AM   Specimen: Nasal Mucosa; Nasopharyngeal  Result Value Ref Range Status   MRSA by PCR NEGATIVE NEGATIVE Final    Comment:        The GeneXpert MRSA Assay (FDA approved for NASAL specimens only), is one component of a comprehensive MRSA colonization surveillance program. It is not intended to diagnose MRSA infection nor to guide or monitor treatment for MRSA infections. Performed at [facility], [address] [place], [place] [address]       ABX   Anti-infectives (From admission, onward)   Start     Dose/Rate Route Frequency Ordered Stop   06/14/20 1000  remdesivir 100 mg in sodium chloride 0.9 % 100 mL IVPB       "  Followed by" Linked Group Details   100 mg 200 mL/hr over 30 Minutes Intravenous Daily 06/13/20 0621 06/18/20 0959   06/13/20 0630  remdesivir 200 mg in sodium chloride 0.9% 250 mL IVPB       "Followed by" Linked Group Details   200 mg 580 mL/hr over 30 Minutes Intravenous Once 06/13/20 0621 06/13/20 0915   06/13/20 0530  cefTRIAXone (ROCEPHIN) 2 g in sodium chloride 0.9 % 100 mL IVPB  Status:  Discontinued        2 g 200 mL/hr over 30 Minutes Intravenous Every 24 hours 06/13/20 0525 06/14/20 0906   06/13/20 0530  azithromycin (ZITHROMAX) 500 mg in sodium chloride 0.9 % 250 mL IVPB  Status:  Discontinued         500 mg 250 mL/hr over 60 Minutes Intravenous Every 24 hours 06/13/20 0525 06/14/20 0906         EVENTS   06/13/2020 - admit 06/14/20 - non HHFN. Not on vent.OFf biopap./ On max precedex. Aigtated briefly and ripped out IV. 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 AKI is improving. Urine strep negative     SUBJECTIVE/OVERNIGHT/INTERVAL HX      06/15/2020 -echo done yesterday and results are pending.  D-dimer improved to 1000 ng/mL remains on Solu-Medrol.  Fever has improved. On Bipap all night , 40% fio2. On precedex gtt. Overnight also anxious -> and got ativan prn-  Total 4mg  total, fentanyl prn -> got none . Per wife -> acute encephalopathy is repeat of post op cardiac setting   BP 133/63   Pulse (!) 59   Temp (!) 97 F (36.1 C) (Axillary)   Resp (!) 23   Ht 5\' 9"  (1.753 m)   Wt 100 kg   SpO2 93%   BMI 32.56 kg/m    I/O last 3 completed shifts: In: 2673.3 [I.V.:2423.3; IV Piggyback:250] Out: 2245 [Urine:2245] No intake/output data recorded.  SpO2: 93 % O2 Flow Rate (L/min): 40 L/min FiO2 (%): 40 %  Estimated body mass index is 32.56 kg/m as calculated from the following:   Height as of this encounter: 5\' 9"  (1.753 m).   Weight as of this encounter: 100 kg.    General: No distress. On bipap Neuro: RASS -3 Psych:NA Resp:  Barrel Chest -no No overt respiratory distress. Sync on 40% bipap CVS: Not examined Ext: Stigmata of Connective Tissue Disease - none HEENT: On BiPAP    LABS   Results for JOB, HOLTSCLAW (MRN [id number]) as of 06/15/2020 08:53  Ref. Range 06/13/2020 14:27 06/14/2020 05:32 06/15/2020 05:17  Fibrin derivatives D-dimer [facility]) Latest Ref Range: 0.00 - 499.00 ng/mL (FEU) 2,589.05 (H) 2,521.25 (H) 1,006.96 (H)  Results for ATANACIO, MELNYK (MRN 13/01/2020) as of 06/15/2020 08:53  Ref. Range 06/13/2020 14:27  06/14/2020 05:32  CRP Latest Ref Range: <1.0 mg/dL 13/01/2020 (H) 13/11/2019 (H)   PULMONARY Recent Labs  Lab 06/13/20 0550 06/14/20 0347  PHART  --  7.43  PCO2ART  --  36  PO2ART  --  73*  HCO3 21.6 23.9  O2SAT 34.5 94.9    CBC Recent Labs  Lab 06/13/20 0529 06/14/20 0532 06/15/20 0517  HGB 10.7* 10.1* 11.5*  HCT 31.2* 30.3* 34.8*  WBC 8.8 8.6 13.5*  PLT 407* 404* 456*    COAGULATION Recent Labs  Lab 06/13/20 0529  INR 1.2    CARDIAC  No results for input(s): TROPONINI in the last 168 hours. No results for input(s): PROBNP in the last 168 hours.   CHEMISTRY Recent Labs  Lab 06/13/20 0529 06/13/20 0529 06/14/20 0541 06/14/20 2310 06/15/20 0517  NA 119*  --  134* 140 136  K 4.2   < > 4.2  --  4.3  CL 85*  --  103  --  104  CO2 19*  --  22  --  24  GLUCOSE 327*  --  191*  --  191*  BUN 61*  --  33*  --  33*  CREATININE 3.50*  --  1.31*  --  0.96  CALCIUM 8.6*  --  8.9  --  8.9  MG  --   --   --   --  2.3  PHOS  --   --   --   --  3.0   < > = values in this interval not displayed.   Estimated Creatinine Clearance: 106.1 mL/min (by C-G formula based on SCr of 0.96 mg/dL).   LIVER Recent Labs  Lab 06/13/20 0529 06/14/20 0541 06/15/20 0517  AST 30 21 15   ALT 24 19 16   ALKPHOS 109 96 92  BILITOT 0.9 0.5 0.4  PROT 7.1 6.1* 6.0*  ALBUMIN 3.0* 2.4* 2.2*  INR 1.2  --   --      INFECTIOUS Recent Labs  Lab 06/13/20 0529 06/13/20 0734 06/14/20 1038 06/15/20 0517  LATICACIDVEN 3.5* 1.6  --  1.1  PROCALCITON 5.07  --  0.60 0.44     ENDOCRINE CBG (last 3)  Recent Labs    06/14/20 1933 06/15/20 0038 06/15/20 0430  GLUCAP 244* 188* 163*         IMAGING x48h  - image(s) personally visualized  -   highlighted in bold DG Chest Port 1 View  Result Date: 06/14/2020 CLINICAL DATA:  Difficulty breathing with cough and history of COVID EXAM: PORTABLE CHEST 1 VIEW COMPARISON:  Yesterday FINDINGS: Cardiomegaly. Prior CABG and left atrial clipping.  Diffuse interstitial and airspace opacity. No visible effusion or air leak. Remote rib fractures. IMPRESSION: Bilateral pulmonary opacity correlating with history of COVID. There is a prominent interstitial component with cardiomegaly, possible superimposed edema. Electronically Signed   By: Marnee SpringJonathon  Watts M.D.   On: 06/14/2020 04:59        ASSESSMENT AND PLAN   Acute hypoxemic respiratory failure due to COVID-19 pneumonia / ARDS HIGH RISK FOR INTUBATION  06/15/2020 -has been on overnight BiPAP and BiPAP continues.  Hypoxemia is improved to 40%. D-dimer improved   Plan  - bipap mandated qhs + day time 2h on with 4h off (Based on recovery trial)  - HHFNC  - goal pulse ox > 88% -await echo - track d-dimer/crp   ACUTE KIDNEY INJURY/Renal Failure  06/15/2020 -resolved as of 06/15/2020  Planm - fluids to D5LR at 50cc/h -continue Foley Catheter-assess need -Avoid nephrotoxic agents -Follow urine output, BMP -Ensure adequate renal perfusion, optimize oxygenation -Renal dose medications     NEUROLOGY Baseline - anxiety: on scheduled TCAD and prn klonopin  UDS positive for opioids (cough syrup per wife) Acute toxic metabolic encephalopathy-   06/15/2020 -continues to have acute metabolic encephalopathy with intermittent agitation.  Needing Precedex infusion as needed lorazepam. Behaving like anxiety and not as opiod withdrawal. Per Wife similar to heart surgery last year in hospital   Plan  -As needed lorazepam -  home TCAD  -  home gabapentin - monitor QTc   ID  - covid. Urine Streptococcus - 06/14/2020  06/15/2020 - no evidence of bacterial infectio... Not on Actemra/Barcitinib due to high PCT.  Fever has improved.  Procalcitonin normalizing  Plan  -Monitor off antibacterial antibiotics- check urine leg and urine step -Continue remdesivir per protocol for Covid -Continue Solu-Medrol per protocol for Covid - await COVID IgG 06/14/20 - await Quantifgeron gold 06/14/20   - check ECHO   T1DM - since age 77 (hemoglobin A1c of 8.3 on October 22, 2015]  06/15/2020 - sugars < 180 - currently 170, Bic and lactate - noraml  Plan  - ICU hyperglycemia protcol  -Await  HgbA1c   Constipation  06/15/2020 0 wife reports this can be a major concern while in hospital  Plan  - colace scheduled but cannot give due to CNS issues    Leukocytosis  06/15/2020 - no fever,Improving PCT  plan  - monitor   Best practice:  Diet:  NPO but meds (can eat if awake and calm) Pain/Anxiety/Delirium protocol (if indicated): precedex with aativan prn VAP protocol (if indicated): na DVT prophylaxis: lovenox 50mg  q24h  GI prophylaxis: h2 blockace Glucose control: ssi Mobility: bed rest Code Status: full Family Communication: called HEather Murph listed [phone]. ->  updted 06/15/20 Disposition: icu     ATTESTATION & SIGNATURE   The patient DUSTINE STICKLER is critically ill with multiple organ systems failure and requires high complexity decision making for assessment and support, frequent evaluation and titration of therapies, application of advanced monitoring technologies and extensive interpretation of multiple databases.   Critical Care Time devoted to patient care services described in this note is  45  Minutes. This time reflects time of care of this signee Dr Mellody Drown. This critical care time does not reflect procedure time, or teaching time or supervisory time of PA/NP/Med student/Med Resident etc but could involve care discussion time     Dr. Kalman Shan, M.D., [facility].C.P Pulmonary and Critical Care Medicine Staff Physician [facility] Critical [facility] Pager: [phone], If no answer or between  15:00h - 7:00h: call [phone]  06/15/2020 9:21 AM

## 2020-06-16 DIAGNOSIS — J9600 Acute respiratory failure, unspecified whether with hypoxia or hypercapnia: Secondary | ICD-10-CM | POA: Diagnosis not present

## 2020-06-16 LAB — CBC WITH DIFFERENTIAL/PLATELET
Abs Immature Granulocytes: 0.68 10*3/uL — ABNORMAL HIGH (ref 0.00–0.07)
Basophils Absolute: 0.1 10*3/uL (ref 0.0–0.1)
Basophils Relative: 1 %
Eosinophils Absolute: 0 10*3/uL (ref 0.0–0.5)
Eosinophils Relative: 0 %
HCT: 34.1 % — ABNORMAL LOW (ref 39.0–52.0)
Hemoglobin: 11.3 g/dL — ABNORMAL LOW (ref 13.0–17.0)
Immature Granulocytes: 5 %
Lymphocytes Relative: 6 %
Lymphs Abs: 0.8 10*3/uL (ref 0.7–4.0)
MCH: 25.6 pg — ABNORMAL LOW (ref 26.0–34.0)
MCHC: 33.1 g/dL (ref 30.0–36.0)
MCV: 77.3 fL — ABNORMAL LOW (ref 80.0–100.0)
Monocytes Absolute: 0.9 10*3/uL (ref 0.1–1.0)
Monocytes Relative: 7 %
Neutro Abs: 10.7 10*3/uL — ABNORMAL HIGH (ref 1.7–7.7)
Neutrophils Relative %: 81 %
Platelets: 437 10*3/uL — ABNORMAL HIGH (ref 150–400)
RBC: 4.41 MIL/uL (ref 4.22–5.81)
RDW: 14.5 % (ref 11.5–15.5)
WBC: 13.2 10*3/uL — ABNORMAL HIGH (ref 4.0–10.5)
nRBC: 0 % (ref 0.0–0.2)

## 2020-06-16 LAB — COMPREHENSIVE METABOLIC PANEL
ALT: 13 U/L (ref 0–44)
AST: 15 U/L (ref 15–41)
Albumin: 2.1 g/dL — ABNORMAL LOW (ref 3.5–5.0)
Alkaline Phosphatase: 88 U/L (ref 38–126)
Anion gap: 7 (ref 5–15)
BUN: 28 mg/dL — ABNORMAL HIGH (ref 6–20)
CO2: 26 mmol/L (ref 22–32)
Calcium: 8.2 mg/dL — ABNORMAL LOW (ref 8.9–10.3)
Chloride: 104 mmol/L (ref 98–111)
Creatinine, Ser: 1.04 mg/dL (ref 0.61–1.24)
GFR, Estimated: >60 mL/min (ref >60)
Glucose, Bld: 200 mg/dL — ABNORMAL HIGH (ref 70–99)
Potassium: 4.6 mmol/L (ref 3.5–5.1)
Sodium: 137 mmol/L (ref 135–145)
Total Bilirubin: 0.6 mg/dL (ref 0.3–1.2)
Total Protein: 5.8 g/dL — ABNORMAL LOW (ref 6.5–8.1)

## 2020-06-16 LAB — FIBRIN DERIVATIVES D-DIMER (ARMC ONLY): Fibrin derivatives D-dimer (ARMC): 1057.64 ng/mL (FEU) — ABNORMAL HIGH (ref 0.00–499.00)

## 2020-06-16 LAB — GLUCOSE, CAPILLARY
Glucose-Capillary: 143 mg/dL — ABNORMAL HIGH (ref 70–99)
Glucose-Capillary: 62 mg/dL — ABNORMAL LOW (ref 70–99)
Glucose-Capillary: 65 mg/dL — ABNORMAL LOW (ref 70–99)
Glucose-Capillary: 192 mg/dL — ABNORMAL HIGH (ref 70–99)
Glucose-Capillary: 174 mg/dL — ABNORMAL HIGH (ref 70–99)
Glucose-Capillary: 195 mg/dL — ABNORMAL HIGH (ref 70–99)
Glucose-Capillary: 192 mg/dL — ABNORMAL HIGH (ref 70–99)
Glucose-Capillary: 146 mg/dL — ABNORMAL HIGH (ref 70–99)
Glucose-Capillary: 85 mg/dL (ref 70–99)

## 2020-06-16 LAB — C-REACTIVE PROTEIN: CRP: 12.5 mg/dL — ABNORMAL HIGH (ref <1.0)

## 2020-06-16 LAB — PROCALCITONIN: Procalcitonin: 0.2 ng/mL

## 2020-06-16 MED ORDER — DEXTROSE 50 % IV SOLN
INTRAVENOUS | Status: AC
  Filled 2020-06-16: qty 50

## 2020-06-16 MED ORDER — DEXTROSE 50 % IV SOLN
25.0000 mL | Freq: Once | INTRAVENOUS | Status: AC
  Administered 2020-06-16: 25 mL via INTRAVENOUS

## 2020-06-16 MED ORDER — HALOPERIDOL LACTATE 5 MG/ML IJ SOLN: 5.0000 mg | Freq: Four times a day (QID) | INTRAMUSCULAR | Status: DC | PRN

## 2020-06-16 NOTE — Progress Notes (Signed)
CRITICAL CARE NOTE Date of admit 06/13/2020 LOS : 3 days   BRIEF    49 y.o. male with below list of previous medical conditions including diabetes mellitus, hypertension, recent diagnosis of COVID-19 on 06/04/2020 presents to the emergency department via private vehicle that he was assisted out of secondary to apparent respiratory difficulty and altered mental status. Patient remains critically ill. Dx with COVID 10/26 Admitted to ICU on biPAP and mental status changes   has a past medical history of Adhesive capsulitis, Anginal pain (HCC), Blindness of left eye, Diabetes mellitus without complication (HCC), Diabetic, retinopathy, proliferative (HCC), GERD (gastroesophageal reflux disease), Hyperlipidemia, and Hypertension.   has a past surgical history that includes Appendectomy; Carpal tunnel release (Bilateral); Eye surgery (Left); cataract surgery (Bilateral, 2014); Shoulder surgery (Left, 2009); and LEFT HEART CATH AND CORONARY ANGIOGRAPHY (Left, 05/22/2019).    MICRO   Recent Results (from the past 240 hour(s))  Blood Culture (routine x 2)     Status: None (Preliminary result)   Collection Time: 06/13/20  5:29 AM   Specimen: BLOOD  Result Value Ref Range Status   Specimen Description BLOOD RIGHT ANTECUBITAL  Final   Special Requests   Final    BOTTLES DRAWN AEROBIC AND ANAEROBIC Blood Culture adequate volume   Culture   Final    NO GROWTH 2 DAYS Performed at [facility], [address], [place], [place] [address]    Report Status PENDING  Incomplete  Blood Culture (routine x 2)     Status: None (Preliminary result)   Collection Time: 06/13/20  5:29 AM   Specimen: BLOOD  Result Value Ref Range Status   Specimen Description BLOOD RIGHT ANTECUBITAL  Final   Special Requests   Final    BOTTLES DRAWN AEROBIC AND ANAEROBIC Blood Culture adequate volume   Culture   Final    NO GROWTH 2 DAYS Performed at [facility], [address] [place], [place]  [address]    Report Status PENDING  Incomplete  Urine culture     Status: None   Collection Time: 06/13/20  5:29 AM   Specimen: In/Out Cath Urine  Result Value Ref Range Status   Specimen Description   Final    IN/OUT CATH URINE Performed at [facility], [address], [place], [place] [address]    Special Requests   Final    NONE Performed at [facility], [address], [place], [place] [address]    Culture   Final    NO GROWTH Performed at [facility], [address], [place], [place] [address]    Report Status 06/14/2020 FINAL  Final  MRSA PCR Screening     Status: None   Collection Time: 06/13/20  9:32 AM   Specimen: Nasal Mucosa; Nasopharyngeal  Result Value Ref Range Status   MRSA by PCR NEGATIVE NEGATIVE Final    Comment:        The GeneXpert MRSA Assay (FDA approved for NASAL specimens only), is one component of a comprehensive MRSA colonization surveillance program. It is not intended to diagnose MRSA infection nor to guide or monitor treatment for MRSA infections. Performed at [facility], [address] [place], [place] [address]       ABX   Anti-infectives (From admission, onward)   Start     Dose/Rate Route Frequency Ordered Stop   06/14/20 1000  remdesivir 100 mg in sodium chloride 0.9 % 100 mL IVPB       "  Followed by" Linked Group Details   100 mg 200 mL/hr over 30 Minutes Intravenous Daily 06/13/20 0621 06/18/20 0959   06/13/20 0630  remdesivir 200 mg in sodium chloride 0.9% 250 mL IVPB       "Followed by" Linked Group Details   200 mg 580 mL/hr over 30 Minutes Intravenous Once 06/13/20 0621 06/13/20 0915   06/13/20 0530  cefTRIAXone (ROCEPHIN) 2 g in sodium chloride 0.9 % 100 mL IVPB  Status:  Discontinued        2 g 200 mL/hr over 30 Minutes Intravenous Every 24 hours 06/13/20 0525 06/14/20 0906   06/13/20 0530  azithromycin (ZITHROMAX) 500 mg in sodium chloride 0.9 % 250 mL IVPB  Status:  Discontinued         500 mg 250 mL/hr over 60 Minutes Intravenous Every 24 hours 06/13/20 0525 06/14/20 0906         EVENTS   06/13/2020 - admit \ 06/14/20 - non HHFN. Not on vent.OFf biopap./ On max precedex. Aigtated briefly and ripped out IV. 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 AKI is improving. Urine strep negative   06/15/20- echo done yesterday and results are pending.  D-dimer improved to 1000 ng/mL remains on Solu-Medrol.  Fever has improved. On Bipap all night , 40% fio2. On precedex gtt. Overnight also anxious -> and got ativan prn-  Total 4mg  total, fentanyl prn -> got none . Per sig other-> acute encephalopathy is repeat of post op cardiac setting   SUBJECTIVE/OVERNIGHT/INTERVAL HX      06/16/2020 -slowly improving delirium. Required lorazepam only 2 mg last night. Last dose was at 10 PM. Nursing has been able to wean his Precedex and currently is off. Although confusion still reported. Did wear BiPAP last night. Currently is on heated high flow nasal cannula. Able to  takle po gabapentin. Drank water - so far has not had feeds. Is on D5 LR at 75cc/h. QTc 0.45 on monitor. ECHO result pending   Results for Mike, Kline (MRN Mike Kline) as of 06/16/2020 12:45  Ref. Range 06/13/2020 14:27 06/14/2020 05:32 06/15/2020 05:17 06/16/2020 05:22  Fibrin derivatives D-dimer ([facility]) Latest Ref Range: 0.00 - 499.00 ng/mL (FEU) 2,589.05 (H) 2,521.25 (H) 1,006.96 (H) 1,057.64 (H)   Results for Mike, Kline (MRN Mike Kline) as of 06/16/2020 12:45  Ref. Range 06/13/2020 14:27 06/14/2020 05:32 06/15/2020 05:17 06/16/2020 05:22  CRP Latest Ref Range: <1.0 mg/dL 13/02/2020 (H) 74.9 (H) 8.9 (H) 12.5 (H)    BP (!) 148/67   Pulse 67   Temp 98 F (36.7 C) (Axillary)   Resp (!) 25   Ht 5\' 9"  (1.753 m)   Wt 100 kg   SpO2 90%   BMI 32.56 kg/m    I/O last 3 completed  shifts: In: 2009.7 [I.V.:1859.7; IV Piggyback:150.1] Out: 2100 [Urine:2100] Total I/O In: 1576.6 [P.O.:120; I.V.:1406.6; IV Piggyback:50] Out: -   SpO2: 90 % O2 Flow Rate (L/min): 40 L/min FiO2 (%): 40 %  Estimated body mass index is 32.56 kg/m as calculated from the following:   Height as of this encounter: 5\' 9"  (1.753 m).   Weight as of this encounter: 100 kg.   EXAM  General Appearance:  Looks bettter Head:  Normocephalic, without obvious abnormality, atraumatic Eyes:  PERRL - yes, conjunctiva/corneas - clear     Ears:  Normal external ear canals, both ears Nose:  G tube - HHFNC Throat:  ETT TUBE - no , OG tube - no Neck:  Supple,  No enlargement/tenderness/nodules Lungs: Clear to auscultation bilaterally, Coughs Heart:  S1 and S2 normal, no murmur, CVP - no.  Pressors - no Abdomen:  Soft, no masses, no organomegaly Genitalia / Rectal:  Not done Extremities:  Extremities- intact Skin:  ntact in exposed areas . Sacral area - not examined Neurologic:  Sedation - off precexex gtt -> RASS - +1 . Moves all 4s - yes. CAM-ICU - midldy positiv . Orientation - partially oriented. Says he is afraid and sad he has covid     LABS    Recent Labs  Lab 06/13/20 0550 06/14/20 0347  PHART  --  7.43  PCO2ART  --  36  PO2ART  --  73*  HCO3 21.6 23.9  O2SAT 34.5 94.9    CBC Recent Labs  Lab 06/14/20 0532 06/15/20 0517 06/16/20 0522  HGB 10.1* 11.5* 11.3*  HCT 30.3* 34.8* 34.1*  WBC 8.6 13.5* 13.2*  PLT 404* 456* 437*    COAGULATION Recent Labs  Lab 06/13/20 0529  INR 1.2    CARDIAC  No results for input(s): TROPONINI in the last 168 hours. No results for input(s): PROBNP in the last 168 hours.   CHEMISTRY Recent Labs  Lab 06/13/20 0529 06/13/20 0529 06/14/20 0541 06/14/20 0541 06/14/20 2310 06/15/20 0517 06/16/20 0522  NA 119*  --  134*  --  140 136 137  K 4.2   < > 4.2   < >  --  4.3 4.6  CL 85*  --  103  --   --  104 104  CO2 19*  --  22  --    --  24 26  GLUCOSE 327*  --  191*  --   --  191* 200*  BUN 61*  --  33*  --   --  33* 28*  CREATININE 3.50*  --  1.31*  --   --  0.96 1.04  CALCIUM 8.6*  --  8.9  --   --  8.9 8.2*  MG  --   --   --   --   --  2.3  --   PHOS  --   --   --   --   --  3.0  --    < > = values in this interval not displayed.   Estimated Creatinine Clearance: 97.9 mL/min (by C-G formula based on SCr of 1.04 mg/dL).   LIVER Recent Labs  Lab 06/13/20 0529 06/14/20 0541 06/15/20 0517 06/16/20 0522  AST 30 21 15 15   ALT 24 19 16 13   ALKPHOS 109 96 92 88  BILITOT 0.9 0.5 0.4 0.6  PROT 7.1 6.1* 6.0* 5.8*  ALBUMIN 3.0* 2.4* 2.2* 2.1*  INR 1.2  --   --   --      INFECTIOUS Recent Labs  Lab 06/13/20 0529 06/13/20 0529 06/13/20 0734 06/14/20 1038 06/15/20 0517 06/16/20 0522  LATICACIDVEN 3.5*  --  1.6  --  1.1  --   PROCALCITON 5.07   < >  --  0.60 0.44 0.20   < > = values in this interval not displayed.     ENDOCRINE CBG (last 3)  Recent Labs    06/16/20 0542 06/16/20 0837 06/16/20 1112  GLUCAP 192* 174* 195*         IMAGING x48h  - image(s) personally  visualized  -   highlighted in bold No results found.      ASSESSMENT AND PLAN   Acute hypoxemic respiratory failure due to COVID-19 pneumonia / ARDS HIGH RISK FOR INTUBATION  06/16/2020 -has been on overnight BiPAP and day time off/on BiPAP continues.  D-dimer improved/ Hypoxemia some btetter   Plan  - bipap mandated qhs + day time 2h on with 4h off (Based on recovery trial)  - HHFNC when not using bipap  - goal pulse ox > 88% -await echo - track d-dimer/crp   ACUTE KIDNEY INJURY/Renal Failure  06/16/2020 -resolved as of 06/15/2020  Planm - fluids to D5LR at 50cc/h -continue Foley Catheter-assess need -Avoid nephrotoxic agents -Follow urine output, BMP -Ensure adequate renal perfusion, optimize oxygenation -Renal dose medications     NEUROLOGY Baseline - anxiety: on scheduled TCAD and prn klonopin  UDS  positive for opioids (cough syrup per gf) Acute toxic metabolic encephalopathy-known hx of this while in hospital   06/16/2020 -much imprpved acute metabolic encephalopathy with intermittent agitation.    Plan  -As needed lorazepam -  home TCAD  -  home gabapentin - start prn haldol - monitor QTc daily (< on 06/16/20) - wean precedex to off as tolerated   ID  - covid.  Status post remdesivir Urine Streptococcus and legionella  - 06/14/2020 - negative  06/16/2020 - no evidence of bacterial infectio... Not on Actemra/Barcitinib due to high PCT.  Procalcitonin normalizing. Afebrile x 24h  Plan  -Monitor off antibacterial antibiotics- check urine leg and urine step -Continue Solu-Medrol per protocol for Covid -need to set stop date - await COVID IgG 06/14/20 - await Quantifgeron gold 06/17/20  - await ECHO results   T1DM - since age 2 (hemoglobin A1c of 8.3 on October 22, 2015]  06/16/2020 - sugars < 180 - currently 170, Bic and lactate - noraml  Plan  - ICU hyperglycemia protcol  -Await  HgbA1c   Constipation -  wife reports this can be a major concern while in hospital   06/16/2020 - 9 no bowel movements but is not had anything to eat  Plan  -Monitor with clear liquid diet    Leukocytosis  06/16/2020 - no fever,Improving PCT  plan  - monitor   Best practice:  Diet:  NPO but meds and clear liquid diet (can eat if awake and calm)  Pain/Anxiety/Delirium protocol (if indicated): As above  VAP protocol (if indicated): na  DVT prophylaxis: lovenox 50mg  q24h   GI prophylaxis: h2 blockace  Glucose control: ssi  Mobility: bed rest  Code Status: full  Family Communication:   - 11/7 called  HEather Murph (signifiant others) listed [phone]. -> will be updated by daughter - 11/7 - 13/7 daughter aged 57 +  ([phone]) - patient wanted her updated directly -> updated  Disposition: ICU     ATTESTATION & SIGNATURE   The patient Mike Kline is critically ill with multiple organ systems failure and requires high complexity decision making for assessment and support, frequent evaluation and titration of therapies, application of advanced monitoring technologies and extensive interpretation of multiple databases.   Critical Care Time devoted to patient care services described in this note is  35  Minutes. This time reflects time of care of this signee Dr Mike Kline. This critical care time does not reflect procedure time, or teaching time or supervisory time of PA/NP/Med student/Med Resident etc but could involve care discussion time  Dr. Kalman Shan, M.D., [facility].C.P Pulmonary and Critical Care Medicine Staff Physician [facility]monary and Critical Care Pager: [phone], If no answer or between  15:00h - 7:00h: call [phone]  06/16/2020 1:07 PM

## 2020-06-17 ENCOUNTER — Inpatient Hospital Stay: Payer: Medicare Other

## 2020-06-17 LAB — CBC WITH DIFFERENTIAL/PLATELET
Abs Immature Granulocytes: 0.9 10*3/uL — ABNORMAL HIGH (ref 0.00–0.07)
Basophils Absolute: 0.1 10*3/uL (ref 0.0–0.1)
Basophils Relative: 1 %
Eosinophils Absolute: 0 10*3/uL (ref 0.0–0.5)
Eosinophils Relative: 0 %
HCT: 33 % — ABNORMAL LOW (ref 39.0–52.0)
Hemoglobin: 10.8 g/dL — ABNORMAL LOW (ref 13.0–17.0)
Immature Granulocytes: 7 %
Lymphocytes Relative: 7 %
Lymphs Abs: 0.9 10*3/uL (ref 0.7–4.0)
MCH: 25.8 pg — ABNORMAL LOW (ref 26.0–34.0)
MCHC: 32.7 g/dL (ref 30.0–36.0)
MCV: 78.8 fL — ABNORMAL LOW (ref 80.0–100.0)
Monocytes Absolute: 0.7 10*3/uL (ref 0.1–1.0)
Monocytes Relative: 6 %
Neutro Abs: 9.5 10*3/uL — ABNORMAL HIGH (ref 1.7–7.7)
Neutrophils Relative %: 79 %
Platelets: 456 10*3/uL — ABNORMAL HIGH (ref 150–400)
RBC: 4.19 MIL/uL — ABNORMAL LOW (ref 4.22–5.81)
RDW: 14.5 % (ref 11.5–15.5)
Smear Review: NORMAL
WBC: 12.1 10*3/uL — ABNORMAL HIGH (ref 4.0–10.5)
nRBC: 0 % (ref 0.0–0.2)

## 2020-06-17 LAB — MAGNESIUM: Magnesium: 2 mg/dL (ref 1.7–2.4)

## 2020-06-17 LAB — COMPREHENSIVE METABOLIC PANEL
ALT: 14 U/L (ref 0–44)
AST: 16 U/L (ref 15–41)
Albumin: 2.1 g/dL — ABNORMAL LOW (ref 3.5–5.0)
Alkaline Phosphatase: 80 U/L (ref 38–126)
Anion gap: 8 (ref 5–15)
BUN: 21 mg/dL — ABNORMAL HIGH (ref 6–20)
CO2: 27 mmol/L (ref 22–32)
Calcium: 8.1 mg/dL — ABNORMAL LOW (ref 8.9–10.3)
Chloride: 100 mmol/L (ref 98–111)
Creatinine, Ser: 0.99 mg/dL (ref 0.61–1.24)
GFR, Estimated: >60 mL/min (ref >60)
Glucose, Bld: 232 mg/dL — ABNORMAL HIGH (ref 70–99)
Potassium: 4.6 mmol/L (ref 3.5–5.1)
Sodium: 135 mmol/L (ref 135–145)
Total Bilirubin: 0.6 mg/dL (ref 0.3–1.2)
Total Protein: 5.7 g/dL — ABNORMAL LOW (ref 6.5–8.1)

## 2020-06-17 LAB — GLUCOSE, CAPILLARY
Glucose-Capillary: 174 mg/dL — ABNORMAL HIGH (ref 70–99)
Glucose-Capillary: 250 mg/dL — ABNORMAL HIGH (ref 70–99)
Glucose-Capillary: 219 mg/dL — ABNORMAL HIGH (ref 70–99)
Glucose-Capillary: 216 mg/dL — ABNORMAL HIGH (ref 70–99)
Glucose-Capillary: 209 mg/dL — ABNORMAL HIGH (ref 70–99)

## 2020-06-17 LAB — ECHOCARDIOGRAM COMPLETE
Height: 69 in
S' Lateral: 3.37 cm
Weight: 3527.36 oz

## 2020-06-17 LAB — BLOOD GAS, VENOUS
Acid-base deficit: 4.9 mmol/L — ABNORMAL HIGH (ref 0.0–2.0)
Bicarbonate: 21.6 mmol/L (ref 20.0–28.0)
O2 Saturation: 34.5 %
Patient temperature: 37
pCO2, Ven: 45 mmHg (ref 44.0–60.0)
pH, Ven: 7.29 (ref 7.250–7.430)
pO2, Ven: <31 mmHg — CL (ref 32.0–45.0)

## 2020-06-17 LAB — PHOSPHORUS: Phosphorus: 3.9 mg/dL (ref 2.5–4.6)

## 2020-06-17 LAB — FIBRIN DERIVATIVES D-DIMER (ARMC ONLY): Fibrin derivatives D-dimer (ARMC): 955.99 ng/mL (FEU) — ABNORMAL HIGH (ref 0.00–499.00)

## 2020-06-17 LAB — C-REACTIVE PROTEIN: CRP: 12.8 mg/dL — ABNORMAL HIGH (ref <1.0)

## 2020-06-17 MED ORDER — SODIUM CHLORIDE 0.9 % IV SOLN
0.4000 ug/kg/h | INTRAVENOUS | Status: DC
  Filled 2020-06-17: qty 4

## 2020-06-17 MED ORDER — DEXMEDETOMIDINE HCL-DEXTROSE 400MCG/100ML -5% IV SOLN
0.4000 ug/kg/h | INTRAVENOUS | Status: DC
  Administered 2020-06-17: 0.8 ug/kg/h via INTRAVENOUS
  Administered 2020-06-17: 0.6 ug/kg/h via INTRAVENOUS
  Filled 2020-06-17 (×3): qty 100

## 2020-06-17 MED ORDER — DEXMEDETOMIDINE HCL IN NACL 400 MCG/100ML IV SOLN
0.4000 ug/kg/h | INTRAVENOUS | Status: DC
  Administered 2020-06-17: 0.6 ug/kg/h via INTRAVENOUS
  Filled 2020-06-17: qty 100

## 2020-06-17 MED ORDER — ORAL CARE MOUTH RINSE
15.0000 mL | Freq: Two times a day (BID) | OROMUCOSAL | Status: DC
  Administered 2020-06-17 – 2020-06-20 (×6): 15 mL via OROMUCOSAL

## 2020-06-17 MED ORDER — DEXMEDETOMIDINE PEDIATRIC BOLUS VIA INFUSION: 0.2000 ug/kg | INTRAVENOUS | Status: DC | PRN

## 2020-06-17 MED ORDER — DEXMEDETOMIDINE PEDIATRIC BOLUS VIA INFUSION: 0.3000 ug/kg | INTRAVENOUS | Status: DC | PRN

## 2020-06-17 NOTE — Progress Notes (Signed)
Erroneous encounter

## 2020-06-17 NOTE — Evaluation (Signed)
Physical Therapy Evaluation Patient Details Name: Mike Kline MRN: [id number] DOB: Jan 15, 1971 Today's Date: 06/17/2020   History of Present Illness  Mike Kline is a 49 year old male who was admitted with chief complaints of respiratory difficulty and AMS. Pt was diagnosed with Covid pneumonia on 10/26. PMH inlcudes DM, HTN, diabetic retinopathy, hypercholesteremia, adhesive capsulitis, insomnia, anxiety, and anginal pain. Pt is blind in L eye.  Clinical Impression  Pt lying in bed with head elevated upon arrival to room and agreeable to PT evaluation. Pt's PLOF is independent with ambulation without usage of an AD and ind with transfers. Pt with no reports of pain or difficulty breathing prior to movement. Pt states that he is blind in his L eye and says that he has been disabled since 2009. Pt with good strength in all 4 extremities, with limited ROM, and generally stiff in all joints which pt reports is normal for him. Pt performed supine to sit with mod A for completion from mid to full upright sitting and swiveling hips to edge of bed. Pt stood to RW with min A and able to weight shift appropriately. Pt limited to edge of bed activities secondary to being on 40L at 46% FiO2. Pt with decreased step lengths and foot clearance bilaterally while sidestepping at bedside. Pt able to perform standing therex with CGA for steadying/safety. Vitals remained stable and WNL during activity. Pt returned to bed with min A for RLE elevation. Pt presents with deficits in functional activity tolerance, balance, and functional mobility. Pt would benefit from skilled PT to address current deficits and recommend HHPT at discharge from acute stay to optimize return to PLOF and maximize functional mobility, safety, and independence.  Vitals: Pre:77 HR, 95%, 19 RR, 129/98 BP Post: 80 HR, 98%, 19 RR    Follow Up Recommendations Home health PT    Equipment Recommendations  Rolling walker with 5" wheels;3in1 (PT)     Recommendations for Other Services       Precautions / Restrictions Precautions Precautions: Fall Restrictions Weight Bearing Restrictions: No      Mobility  Bed Mobility Overal bed mobility: Needs Assistance Bed Mobility: Supine to Sit;Sit to Supine     Supine to sit: Mod assist;HOB elevated Sit to supine: Min assist   General bed mobility comments: Pt's body very stiff; required mod A for swiveling hips and for trunk elevation to some into sitting; min A for RLE elevation onto bed for sit to supine    Transfers Overall transfer level: Needs assistance Equipment used: Rolling walker (2 wheeled) Transfers: Sit to/from Stand Sit to Stand: Min assist         General transfer comment: Min a for boosting to stand and for steadying  Ambulation/Gait Ambulation/Gait assistance: Min guard Gait Distance (Feet): 3 Feet Assistive device: Rolling walker (2 wheeled) Gait Pattern/deviations: Decreased step length - right;Decreased step length - left;Wide base of support Gait velocity: decreased   General Gait Details: Pt limited to edge of bed activities: sidestepped to L and R with CGA for steadying and RW  Stairs            Wheelchair Mobility    Modified Rankin (Stroke Patients Only)       Balance Overall balance assessment: Needs assistance Sitting-balance support: No upper extremity supported;Feet supported Sitting balance-Leahy Scale: Good Sitting balance - Comments: no overt LOB without BUE support   Standing balance support: Bilateral upper extremity supported;During functional activity Standing balance-Leahy Scale: Fair Standing balance comment:  fair to good static standing balance with BUE reliant on RW                             Pertinent Vitals/Pain Pain Assessment: No/denies pain    Home Living Family/patient expects to be discharged to:: Private residence Living Arrangements: Alone Available Help at Discharge: Family;Available 24  hours/day;Friend(s);Available PRN/intermittently Type of Home: House Home Access: Stairs to enter Entrance Stairs-Rails: Right;Left;Can reach both Entrance Stairs-Number of Steps: 4 Home Layout: One level Home Equipment: Grab bars - toilet      Prior Function Level of Independence: Independent         Comments: Pt reports independence with ambulation/transfers prior to admission; denies fall history in last 6 months, states that he is disabled, and reports that all of his joints are stiff and limited to some extent.      Hand Dominance        Extremity/Trunk Assessment   Upper Extremity Assessment Upper Extremity Assessment: Overall WFL for tasks assessed;Generalized weakness;RUE deficits/detail;LUE deficits/detail (grossly 4 to 5/5 bilaterally) RUE Deficits / Details: limited shoulder flexion to 2/3 of full arc of motion LUE Deficits / Details: limited shoulder flexion to 2/3 of full arc of motion    Lower Extremity Assessment Lower Extremity Assessment: Generalized weakness;Overall [facility] for tasks assessed (limited knee bilat. ROM to approx 80-90 degrees)    Cervical / Trunk Assessment Cervical / Trunk Assessment: Normal  Communication   Communication: No difficulties  Cognition Arousal/Alertness: Awake/alert Behavior During Therapy: WFL for tasks assessed/performed Overall Cognitive Status: Within Functional Limits for tasks assessed                                 General Comments: Pt is alert and oriented x 4. Pt states that his confusion has mostly cleared.      General Comments General comments (skin integrity, edema, etc.): pt with good tolerance to OOB/standing activity with vitals remaining stable    Exercises Other Exercises Other Exercises: standing marching x 10 bilat and hip flexion x 10 bilat with BUE on RW   Assessment/Plan    PT Assessment Patient needs continued PT services  PT Problem List Decreased strength;Decreased range of  motion;Decreased activity tolerance;Decreased balance;Decreased mobility;Cardiopulmonary status limiting activity       PT Treatment Interventions DME instruction;Gait training;Stair training;Functional mobility training;Therapeutic activities;Therapeutic exercise;Balance training;Patient/family education    PT Goals (Current goals can be found in the Care Plan section)  Acute Rehab PT Goals Patient Stated Goal: to get better and go home PT Goal Formulation: With patient Time For Goal Achievement: 07/01/20 Potential to Achieve Goals: Good Additional Goals Additional Goal #1: Pt will perform sit <> stand transfers from bed/chair level using LRAD with no more than mod I. (to maximize functional mobility)    Frequency Min 2X/week   Barriers to discharge        Co-evaluation               AM-PAC PT "6 Clicks" Mobility  Outcome Measure Help needed turning from your back to your side while in a flat bed without using bedrails?: A Little Help needed moving from lying on your back to sitting on the side of a flat bed without using bedrails?: A Lot Help needed moving to and from a bed to a chair (including a wheelchair)?: A Little Help needed standing up from a  chair using your arms (e.g., wheelchair or bedside chair)?: A Little Help needed to walk in hospital room?: A Little Help needed climbing 3-5 steps with a railing? : A Lot 6 Click Score: 16    End of Session Equipment Utilized During Treatment: Gait belt;Oxygen (40L at 46% FiO2) Activity Tolerance: Patient tolerated treatment well Patient left: in bed;with call bell/phone within reach;with bed alarm set;with SCD's reapplied Nurse Communication: Mobility status;Other (comment) (pt complaining of pinching in indewlling catheter) PT Visit Diagnosis: Unsteadiness on feet (R26.81);Other abnormalities of gait and mobility (R26.89);Muscle weakness (generalized) (M62.81)    Time: 1308-6578 PT Time Calculation (min) (ACUTE ONLY):  46 min   Charges:              Frederich Chick, SPT  Frederich Chick 06/17/2020, 4:46 PM

## 2020-06-17 NOTE — Progress Notes (Signed)
CRITICAL CARE NOTE Date of admit 06/13/2020 LOS : 4 days   BRIEF    49 y.o. male with below list of previous medical conditions including diabetes mellitus, hypertension, recent diagnosis of COVID-19 on 06/04/2020 presents to the emergency department via private vehicle that he was assisted out of secondary to apparent respiratory difficulty and altered mental status. Patient remains critically ill. Dx with COVID 10/26 Admitted to ICU on biPAP and mental status changes   has a past medical history of Adhesive capsulitis, Anginal pain (HCC), Blindness of left eye, Diabetes mellitus without complication (HCC), Diabetic, retinopathy, proliferative (HCC), GERD (gastroesophageal reflux disease), Hyperlipidemia, and Hypertension.   has a past surgical history that includes Appendectomy; Carpal tunnel release (Bilateral); Eye surgery (Left); cataract surgery (Bilateral, 2014); Shoulder surgery (Left, 2009); and LEFT HEART CATH AND CORONARY ANGIOGRAPHY (Left, 05/22/2019).    MICRO   Recent Results (from the past 240 hour(s))  Blood Culture (routine x 2)     Status: None (Preliminary result)   Collection Time: 06/13/20  5:29 AM   Specimen: BLOOD  Result Value Ref Range Status   Specimen Description BLOOD RIGHT ANTECUBITAL  Final   Special Requests   Final    BOTTLES DRAWN AEROBIC AND ANAEROBIC Blood Culture adequate volume   Culture   Final    NO GROWTH 4 DAYS Performed at [facility], [address], [place], [place] [address]    Report Status PENDING  Incomplete  Blood Culture (routine x 2)     Status: None (Preliminary result)   Collection Time: 06/13/20  5:29 AM   Specimen: BLOOD  Result Value Ref Range Status   Specimen Description BLOOD RIGHT ANTECUBITAL  Final   Special Requests   Final    BOTTLES DRAWN AEROBIC AND ANAEROBIC Blood Culture adequate volume   Culture   Final    NO GROWTH 4 DAYS Performed at [facility], [address], [place], [place]  [address]    Report Status PENDING  Incomplete  Urine culture     Status: None   Collection Time: 06/13/20  5:29 AM   Specimen: In/Out Cath Urine  Result Value Ref Range Status   Specimen Description   Final    IN/OUT CATH URINE Performed at [facility], [address], [place], [place] [address]    Special Requests   Final    NONE Performed at [facility], [address] [place], [place] [address]    Culture   Final    NO GROWTH Performed at [facility], [address] [place], [place] [address]    Report Status 06/14/2020 FINAL  Final  MRSA PCR Screening     Status: None   Collection Time: 06/13/20  9:32 AM   Specimen: Nasal Mucosa; Nasopharyngeal  Result Value Ref Range Status   MRSA by PCR NEGATIVE NEGATIVE Final    Comment:        The GeneXpert MRSA Assay (FDA approved for NASAL specimens only), is one component of a comprehensive MRSA colonization surveillance program. It is not intended to diagnose MRSA infection nor to guide or monitor treatment for MRSA infections. Performed at [facility], [address] [place], [place] [address]       ABX   Anti-infectives (From admission, onward)   Start     Dose/Rate Route Frequency Ordered Stop   06/14/20 1000  remdesivir 100 mg in sodium chloride 0.9 % 100 mL IVPB       "  Followed by" Linked Group Details   100 mg 200 mL/hr over 30 Minutes Intravenous Daily 06/13/20 0621 06/17/20 1200   06/13/20 0630  remdesivir 200 mg in sodium chloride 0.9% 250 mL IVPB       "Followed by" Linked Group Details   200 mg 580 mL/hr over 30 Minutes Intravenous Once 06/13/20 0621 06/13/20 0915   06/13/20 0530  cefTRIAXone (ROCEPHIN) 2 g in sodium chloride 0.9 % 100 mL IVPB  Status:  Discontinued        2 g 200 mL/hr over 30 Minutes Intravenous Every 24 hours 06/13/20 0525 06/14/20 0906   06/13/20 0530  azithromycin (ZITHROMAX) 500 mg in sodium chloride 0.9 % 250 mL IVPB  Status:  Discontinued         500 mg 250 mL/hr over 60 Minutes Intravenous Every 24 hours 06/13/20 0525 06/14/20 0906          SUBJECTIVE/OVERNIGHT/INTERVAL HX   CC Follow up COVID  HPI Weaned off biPAP Alert and awake Weaned off precedex Stop fluids Eating Wean off high flow [place]   Results for KITAI, PURDOM (MRN [id number]) as of 06/16/2020 12:45  Ref. Range 06/13/2020 14:27 06/14/2020 05:32 06/15/2020 05:17 06/16/2020 05:22  Fibrin derivatives D-dimer ([facility]) Latest Ref Range: 0.00 - 499.00 ng/mL (FEU) 2,589.05 (H) 2,521.25 (H) 1,006.96 (H) 1,057.64 (H)   Results for GRACIN, MCPARTLAND (MRN [id number]) as of 06/16/2020 12:45  Ref. Range 06/13/2020 14:27 06/14/2020 05:32 06/15/2020 05:17 06/16/2020 05:22  CRP Latest Ref Range: <1.0 mg/dL 97.9 (H) 89.2 (H) 8.9 (H) 12.5 (H)    BP 116/64   Pulse (!) 111   Temp 98.5 F (36.9 C) (Oral)   Resp 17   Ht 5\' 9"  (1.753 m)   Wt 101 kg   SpO2 97%   BMI 32.88 kg/m    I/O last 3 completed shifts: In: 4336.3 [P.O.:1440; I.V.:2596.3; IV Piggyback:300] Out: 4501 [Urine:4500; Stool:1] No intake/output data recorded.  SpO2: 97 % O2 Flow Rate (L/min): 40 L/min FiO2 (%): 46 %  Estimated body mass index is 32.88 kg/m as calculated from the following:   Height as of this encounter: 5\' 9"  (1.753 m).   Weight as of this encounter: 101 kg.   EXAM.kkphy Physical Examination:   General Appearance: No distress  Neuro:without focal findings,  speech normal,  HEENT: PERRLA, EOM intact.   Pulmonary: normal breath sounds, No wheezing.  CardiovascularNormal S1,S2.  No m/r/g.   Abdomen: Benign, Soft, non-tender. Renal:  No costovertebral tenderness  GU:  Not performed at this time. Endoc: No evident thyromegaly Skin:   warm, no rashes, no ecchymosis  Extremities: normal, no cyanosis, clubbing. PSYCHIATRIC: Mood, affect within normal limits.    LABS    Recent Labs  Lab 06/13/20 0550 06/14/20 0347  PHART  --  7.43  PCO2ART  --  36  PO2ART  --  73*  HCO3 21.6  23.9  O2SAT 34.5 94.9    CBC Recent Labs  Lab 06/15/20 0517 06/16/20 0522 06/17/20 0603  HGB 11.5* 11.3* 10.8*  HCT 34.8* 34.1* 33.0*  WBC 13.5* 13.2* 12.1*  PLT 456* 437* 456*    COAGULATION Recent Labs  Lab 06/13/20 0529  INR 1.2    CARDIAC  No results for input(s): TROPONINI in the last 168 hours. No results for input(s): PROBNP in the last 168 hours.   CHEMISTRY Recent Labs  Lab 06/13/20 0529 06/13/20 0529 06/14/20 0541 06/14/20 0541 06/14/20 2310 06/15/20 13/05/21 06/15/20 0517 06/16/20 0522 06/17/20 13/07/21  NA 119*   < > 134*  --  140 136  --  137 135  K 4.2   < > 4.2   < >  --  4.3   < > 4.6 4.6  CL 85*  --  103  --   --  104  --  104 100  CO2 19*  --  22  --   --  24  --  26 27  GLUCOSE 327*  --  191*  --   --  191*  --  200* 232*  BUN 61*  --  33*  --   --  33*  --  28* 21*  CREATININE 3.50*  --  1.31*  --   --  0.96  --  1.04 0.99  CALCIUM 8.6*  --  8.9  --   --  8.9  --  8.2* 8.1*  MG  --   --   --   --   --  2.3  --   --  2.0  PHOS  --   --   --   --   --  3.0  --   --  3.9   < > = values in this interval not displayed.   Estimated Creatinine Clearance: 103.4 mL/min (by C-G formula based on SCr of 0.99 mg/dL).   LIVER Recent Labs  Lab 06/13/20 0529 06/14/20 0541 06/15/20 0517 06/16/20 0522 06/17/20 0603  AST 30 21 15 15 16   ALT 24 19 16 13 14   ALKPHOS 109 96 92 88 80  BILITOT 0.9 0.5 0.4 0.6 0.6  PROT 7.1 6.1* 6.0* 5.8* 5.7*  ALBUMIN 3.0* 2.4* 2.2* 2.1* 2.1*  INR 1.2  --   --   --   --      INFECTIOUS Recent Labs  Lab 06/13/20 0529 06/13/20 0529 06/13/20 0734 06/14/20 1038 06/15/20 0517 06/16/20 0522  LATICACIDVEN 3.5*  --  1.6  --  1.1  --   PROCALCITON 5.07   < >  --  0.60 0.44 0.20   < > = values in this interval not displayed.     ENDOCRINE CBG (last 3)  Recent Labs    06/17/20 0416 06/17/20 0745 06/17/20 1131  GLUCAP 174* 250* 219*         IMAGING x48h  - image(s) personally visualized  -   highlighted  in bold DG Chest Port 1 View  Result Date: 06/17/2020 CLINICAL DATA:  COVID pneumonia EXAM: PORTABLE CHEST 1 VIEW COMPARISON:  06/14/2020 FINDINGS: The cardiac silhouette, mediastinal and hilar contours are stable. Stable surgical changes from bypass surgery. Persistent diffuse interstitial and airspace process in the lungs consistent with COVID pneumonia. No pleural effusions or pneumothorax. IMPRESSION: Persistent extensive interstitial and airspace process consistent with COVID pneumonia. Electronically Signed   By: 13/03/2020 M.D.   On: 06/17/2020 07:52        ASSESSMENT AND PLAN   Acute hypoxemic respiratory failure due to COVID-19 pneumonia / ARDS Slowly resolving Wean off oxygen as tolerated   NEUROLOGY Baseline - anxiety: on scheduled TCAD and prn klonopin  UDS positive for opioids (cough syrup per gf) Acute toxic metabolic encephalopathy-known hx of this while in hospital Wean off precedex  ID  - covid.  Status post remdesivir Urine Streptococcus and legionella  - 06/14/2020 - negative  PT/OT/ST Plan to transfer to [facility] in next 24-48 hrs    Lashaye Fisk 13/12/2019, M.D.  [facility] Pulmonary & Critical Care Medicine  Medical  Director [place] Director [facility] Cardio-Pulmonary Department

## 2020-06-18 ENCOUNTER — Other Ambulatory Visit: Payer: Self-pay

## 2020-06-18 ENCOUNTER — Encounter: Payer: Self-pay | Admitting: Internal Medicine

## 2020-06-18 LAB — CULTURE, BLOOD (ROUTINE X 2)
Culture: NO GROWTH
Culture: NO GROWTH
Special Requests: ADEQUATE
Special Requests: ADEQUATE

## 2020-06-18 LAB — COMPREHENSIVE METABOLIC PANEL
ALT: 15 U/L (ref 0–44)
AST: 20 U/L (ref 15–41)
Albumin: 2.4 g/dL — ABNORMAL LOW (ref 3.5–5.0)
Alkaline Phosphatase: 82 U/L (ref 38–126)
Anion gap: 13 (ref 5–15)
BUN: 18 mg/dL (ref 6–20)
CO2: 27 mmol/L (ref 22–32)
Calcium: 8.6 mg/dL — ABNORMAL LOW (ref 8.9–10.3)
Chloride: 98 mmol/L (ref 98–111)
Creatinine, Ser: 0.9 mg/dL (ref 0.61–1.24)
GFR, Estimated: >60 mL/min (ref >60)
Glucose, Bld: 164 mg/dL — ABNORMAL HIGH (ref 70–99)
Potassium: 3.9 mmol/L (ref 3.5–5.1)
Sodium: 138 mmol/L (ref 135–145)
Total Bilirubin: 0.8 mg/dL (ref 0.3–1.2)
Total Protein: 6.7 g/dL (ref 6.5–8.1)

## 2020-06-18 LAB — FIBRIN DERIVATIVES D-DIMER (ARMC ONLY): Fibrin derivatives D-dimer (ARMC): 581.8 ng/mL (FEU) — ABNORMAL HIGH (ref 0.00–499.00)

## 2020-06-18 LAB — CBC WITH DIFFERENTIAL/PLATELET
Abs Immature Granulocytes: 1.32 10*3/uL — ABNORMAL HIGH (ref 0.00–0.07)
Basophils Absolute: 0.1 10*3/uL (ref 0.0–0.1)
Basophils Relative: 1 %
Eosinophils Absolute: 0 10*3/uL (ref 0.0–0.5)
Eosinophils Relative: 0 %
HCT: 36.5 % — ABNORMAL LOW (ref 39.0–52.0)
Hemoglobin: 11.9 g/dL — ABNORMAL LOW (ref 13.0–17.0)
Immature Granulocytes: 7 %
Lymphocytes Relative: 7 %
Lymphs Abs: 1.5 10*3/uL (ref 0.7–4.0)
MCH: 26 pg (ref 26.0–34.0)
MCHC: 32.6 g/dL (ref 30.0–36.0)
MCV: 79.7 fL — ABNORMAL LOW (ref 80.0–100.0)
Monocytes Absolute: 1.1 10*3/uL — ABNORMAL HIGH (ref 0.1–1.0)
Monocytes Relative: 6 %
Neutro Abs: 16.2 10*3/uL — ABNORMAL HIGH (ref 1.7–7.7)
Neutrophils Relative %: 79 %
Platelets: 574 10*3/uL — ABNORMAL HIGH (ref 150–400)
RBC: 4.58 MIL/uL (ref 4.22–5.81)
RDW: 14.5 % (ref 11.5–15.5)
Smear Review: NORMAL
WBC: 20.3 10*3/uL — ABNORMAL HIGH (ref 4.0–10.5)
nRBC: 0 % (ref 0.0–0.2)

## 2020-06-18 LAB — C-REACTIVE PROTEIN: CRP: 7.8 mg/dL — ABNORMAL HIGH (ref <1.0)

## 2020-06-18 LAB — GLUCOSE, CAPILLARY
Glucose-Capillary: 139 mg/dL — ABNORMAL HIGH (ref 70–99)
Glucose-Capillary: 151 mg/dL — ABNORMAL HIGH (ref 70–99)
Glucose-Capillary: 197 mg/dL — ABNORMAL HIGH (ref 70–99)
Glucose-Capillary: 207 mg/dL — ABNORMAL HIGH (ref 70–99)
Glucose-Capillary: 207 mg/dL — ABNORMAL HIGH (ref 70–99)
Glucose-Capillary: 231 mg/dL — ABNORMAL HIGH (ref 70–99)

## 2020-06-18 LAB — SAR COV2 SEROLOGY (COVID19)AB(IGG),IA: SARS-CoV-2 Ab, IgG: REACTIVE — AB

## 2020-06-18 LAB — PHOSPHORUS: Phosphorus: 3.1 mg/dL (ref 2.5–4.6)

## 2020-06-18 MED ORDER — INSULIN ASPART 100 UNIT/ML ~~LOC~~ SOLN
0.0000 [IU] | Freq: Three times a day (TID) | SUBCUTANEOUS | Status: DC
  Administered 2020-06-18 (×3): 7 [IU] via SUBCUTANEOUS
  Administered 2020-06-19: 4 [IU] via SUBCUTANEOUS
  Administered 2020-06-19: 11 [IU] via SUBCUTANEOUS
  Administered 2020-06-19: 15 [IU] via SUBCUTANEOUS
  Administered 2020-06-19: 11 [IU] via SUBCUTANEOUS
  Administered 2020-06-20 (×2): 4 [IU] via SUBCUTANEOUS
  Filled 2020-06-18 (×9): qty 1

## 2020-06-18 MED ORDER — FAMOTIDINE 20 MG PO TABS
20.0000 mg | ORAL_TABLET | Freq: Every day | ORAL | Status: DC
  Administered 2020-06-19 – 2020-06-20 (×2): 20 mg via ORAL
  Filled 2020-06-18 (×2): qty 1

## 2020-06-18 MED ORDER — METHYLPREDNISOLONE SODIUM SUCC 40 MG IJ SOLR
20.0000 mg | Freq: Two times a day (BID) | INTRAMUSCULAR | Status: DC
  Administered 2020-06-18 – 2020-06-19 (×2): 20 mg via INTRAVENOUS
  Filled 2020-06-18 (×2): qty 1

## 2020-06-18 NOTE — Progress Notes (Signed)
Per pt, Teddy Spike (daughter) is to be the primary emergency contact and is to be the person that is updated regularly regarding pt's care.

## 2020-06-18 NOTE — Evaluation (Addendum)
Occupational Therapy Evaluation Patient Details Name: Mike Kline MRN: [id number] DOB: 05/02/1971 Today's Date: 06/18/2020    History of Present Illness Mike Kline is a 49 year old male who was admitted with chief complaints of respiratory difficulty and AMS. Pt was diagnosed with Covid pneumonia on 10/26. PMH inlcudes DM, HTN, diabetic retinopathy, hypercholesteremia, adhesive capsulitis, insomnia, anxiety, and anginal pain. Pt is blind in L eye.   Clinical Impression   Pt was seen for OT evaluation this date. Prior to hospital admission, pt was INDEP with self care ADLs and ADL mobility. Pt lives alone in [facility] with 4 STE. Currently pt demonstrates impairments as described below (See OT problem list) which functionally limit his ability to perform ADL/self-care tasks. Pt currently requires MIN A for LB ADLs and CGA for ADL transfers with RW and demos decreased standing tolerance of ~2-3 mins.  Pt would benefit from skilled OT services to address noted impairments and functional limitations (see below for any additional details) in order to maximize safety and independence while minimizing falls risk and caregiver burden. Upon hospital discharge, recommend HHOT to maximize pt safety and return to functional independence during meaningful occupations of daily life. Of note: pt on 5-6L HFNC throughout session with sats ~95-99%. Pt's HR slightly elevated intermittently from 110-120 at rest and ~130s with activity in standing.    Follow Up Recommendations  Home health OT    Equipment Recommendations  Tub/shower seat;Other (comment) (grab bars in shower)    Recommendations for Other Services       Precautions / Restrictions Precautions Precautions: Fall Restrictions Weight Bearing Restrictions: No      Mobility Bed Mobility               General bed mobility comments: pt up to chair pre/post session    Transfers Overall transfer level: Needs assistance Equipment used: Rolling  walker (2 wheeled) Transfers: Sit to/from Stand           General transfer comment: CGA for boosting to stand and for steadying    Balance Overall balance assessment: Needs assistance Sitting-balance support: No upper extremity supported;Feet supported Sitting balance-Leahy Scale: Good Sitting balance - Comments: no overt LOB without BUE support   Standing balance support: Bilateral upper extremity supported;During functional activity Standing balance-Leahy Scale: Fair Standing balance comment: benefits from UE support, can static stand w/o UE support with just SBA                           ADL either performed or assessed with clinical judgement   ADL Overall ADL's : Needs assistance/impaired                                       General ADL Comments: INDEP for seated UB ADLs, MIN A For seated LB ADLs, CGA For ADL trasnfers.     Vision Patient Visual Report: No change from baseline Additional Comments: blind in L eye at baseline     Perception     Praxis      Pertinent Vitals/Pain Pain Assessment: No/denies pain     Hand Dominance     Extremity/Trunk Assessment Upper Extremity Assessment Upper Extremity Assessment: Overall WFL for tasks assessed (grossly 4+/5 B'ly)   Lower Extremity Assessment Lower Extremity Assessment: Defer to PT evaluation;Overall [facility] for tasks assessed (some decreased hip/knee flexibility for external rotation as  it pertains to LB dressing. Pt reports sock-aide use at home.)   Cervical / Trunk Assessment Cervical / Trunk Assessment: Normal   Communication Communication Communication: No difficulties   Cognition Arousal/Alertness: Awake/alert Behavior During Therapy: WFL for tasks assessed/performed Overall Cognitive Status: Within Functional Limits for tasks assessed                                     General Comments       Exercises Other Exercises Other Exercises: OT facilitates ed  re: role of OT, importance of OOB Activity, benefits of OT exercise. Pt very agreeable and demos good understanding   Shoulder Instructions      Home Living Family/patient expects to be discharged to:: Private residence Living Arrangements: Alone Available Help at Discharge: Family;Available 24 hours/day;Friend(s);Available PRN/intermittently Type of Home: House Home Access: Stairs to enter [employer] of Steps: 4 Entrance Stairs-Rails: Right;Left;Can reach both Home Layout: One level     Bathroom Shower/Tub: Chief Strategy Officer: Standard     Home Equipment: Grab bars - toilet          Prior Functioning/Environment Level of Independence: Independent with assistive device(s)        Comments: Pt reports independence with ambulation/transfers prior to admission; denies fall history in last 6 months, states that he is disabled, and reports that all of his joints are stiff and limited to some extent.  Uses sock aide and shoe horn for LB dressing at baseline.        OT Problem List: Decreased strength;Decreased activity tolerance;Impaired balance (sitting and/or standing);Cardiopulmonary status limiting activity      OT Treatment/Interventions: Self-care/ADL training;DME and/or AE instruction;Therapeutic activities;Therapeutic exercise;Energy conservation;Patient/family education;Balance training    OT Goals(Current goals can be found in the care plan section) Acute Rehab OT Goals Patient Stated Goal: to get better and go home OT Goal Formulation: With patient Time For Goal Achievement: 07/02/20 Potential to Achieve Goals: Good ADL Goals Pt Will Perform Grooming: with modified independence;standing (for 2-3 g/h tasks to improve stnading fxl activity tolerance) Pt Will Transfer to Toilet: with modified independence;ambulating Pt/caregiver will Perform Home Exercise Program: Increased strength;Both right and left upper extremity;With  Supervision Additional ADL Goal #1: Pt will verbalize/demo use of 3 EC strategies in ADLs with 0 verbal cues.  OT Frequency: Min 1X/week   Barriers to D/C:            Co-evaluation              AM-PAC OT "6 Clicks" Daily Activity     Outcome Measure Help from another person eating meals?: None Help from another person taking care of personal grooming?: None Help from another person toileting, which includes using toliet, bedpan, or urinal?: A Little Help from another person bathing (including washing, rinsing, drying)?: A Little Help from another person to put on and taking off regular upper body clothing?: None Help from another person to put on and taking off regular lower body clothing?: A Little 6 Click Score: 21   End of Session Equipment Utilized During Treatment: Gait belt;Rolling walker Nurse Communication: Mobility status  Activity Tolerance: Patient tolerated treatment well Patient left: in chair;with call bell/phone within reach  OT Visit Diagnosis: Muscle weakness (generalized) (M62.81)                Time: 1322-1400 OT Time Calculation (min): 38 min Charges:  OT General  Charges $OT Visit: 1 Visit OT Evaluation $OT Eval Moderate Complexity: 1 Mod OT Treatments $Self Care/Home Management : 8-22 mins $Therapeutic Activity: 8-22 mins  Rejeana Brock, MS, OTR/L ascom [phone] 06/18/20, 4:12 PM

## 2020-06-18 NOTE — Progress Notes (Signed)
[facility] hospitalist acceptance note  49 year old male history of obesity, hypertension, GERD, diabetes type 1 polysubstance abuse admitted directly to the intensive care unit for acute hypoxic respiratory failure and acute metabolic encephalopathy in the setting of COVID-19 pneumonia.  Did not require endotracheal intubation.  Required BiPAP and high flow oxygen during stay in the ICU.  Completed remdesivir.  Remains on weight-based intravenous steroids.  Has now been weaned to nasal cannula and saturating adequately on 2 L.  Significantly fatigued, lethargic.  Stable for transfer to general medical service.  Will need therapy evaluations and TOC consult.  [facility] hospitalist service to assume primary care of this patient on 06/19/2020 Case discussed with PCCM Dr. Unknown Foley MD [facility]

## 2020-06-18 NOTE — Progress Notes (Signed)
CRITICAL CARE NOTE Date of admit 06/13/2020 LOS : 5 days   BRIEF    49 y.o. male with below list of previous medical conditions including diabetes mellitus, hypertension, recent diagnosis of COVID-19 on 06/04/2020 presents to the emergency department via private vehicle that he was assisted out of secondary to apparent respiratory difficulty and altered mental status. Patient remains critically ill. Dx with COVID 10/26 Admitted to ICU on biPAP and mental status changes   has a past medical history of Adhesive capsulitis, Anginal pain (HCC), Blindness of left eye, Diabetes mellitus without complication (HCC), Diabetic, retinopathy, proliferative (HCC), GERD (gastroesophageal reflux disease), Hyperlipidemia, and Hypertension.   has a past surgical history that includes Appendectomy; Carpal tunnel release (Bilateral); Eye surgery (Left); cataract surgery (Bilateral, 2014); Shoulder surgery (Left, 2009); and LEFT HEART CATH AND CORONARY ANGIOGRAPHY (Left, 05/22/2019).    MICRO   Recent Results (from the past 240 hour(s))  Blood Culture (routine x 2)     Status: None   Collection Time: 06/13/20  5:29 AM   Specimen: BLOOD  Result Value Ref Range Status   Specimen Description BLOOD RIGHT ANTECUBITAL  Final   Special Requests   Final    BOTTLES DRAWN AEROBIC AND ANAEROBIC Blood Culture adequate volume   Culture   Final    NO GROWTH 5 DAYS Performed at [facility], [address], [place], [place] [address]    Report Status 06/18/2020 FINAL  Final  Blood Culture (routine x 2)     Status: None   Collection Time: 06/13/20  5:29 AM   Specimen: BLOOD  Result Value Ref Range Status   Specimen Description BLOOD RIGHT ANTECUBITAL  Final   Special Requests   Final    BOTTLES DRAWN AEROBIC AND ANAEROBIC Blood Culture adequate volume   Culture   Final    NO GROWTH 5 DAYS Performed at [facility], [address] [place], [place] [address]    Report Status 06/18/2020  FINAL  Final  Urine culture     Status: None   Collection Time: 06/13/20  5:29 AM   Specimen: In/Out Cath Urine  Result Value Ref Range Status   Specimen Description   Final    IN/OUT CATH URINE Performed at [facility], [address], [place], [place] [address]    Special Requests   Final    NONE Performed at [facility], [address], [place], [place] [address]    Culture   Final    NO GROWTH Performed at [facility], [address], [place], [place] [address]    Report Status 06/14/2020 FINAL  Final  MRSA PCR Screening     Status: None   Collection Time: 06/13/20  9:32 AM   Specimen: Nasal Mucosa; Nasopharyngeal  Result Value Ref Range Status   MRSA by PCR NEGATIVE NEGATIVE Final    Comment:        The GeneXpert MRSA Assay (FDA approved for NASAL specimens only), is one component of a comprehensive MRSA colonization surveillance program. It is not intended to diagnose MRSA infection nor to guide or monitor treatment for MRSA infections. Performed at [facility], [address] [place], [place] [address]       ABX   Anti-infectives (From admission, onward)   Start     Dose/Rate Route Frequency Ordered Stop   06/14/20 1000  remdesivir 100 mg in sodium chloride 0.9 % 100 mL IVPB       "Followed  by" Linked Group Details   100 mg 200 mL/hr over 30 Minutes Intravenous Daily 06/13/20 0621 06/17/20 1200   06/13/20 0630  remdesivir 200 mg in sodium chloride 0.9% 250 mL IVPB       "Followed by" Linked Group Details   200 mg 580 mL/hr over 30 Minutes Intravenous Once 06/13/20 0621 06/13/20 0915   06/13/20 0530  cefTRIAXone (ROCEPHIN) 2 g in sodium chloride 0.9 % 100 mL IVPB  Status:  Discontinued        2 g 200 mL/hr over 30 Minutes Intravenous Every 24 hours 06/13/20 0525 06/14/20 0906   06/13/20 0530  azithromycin (ZITHROMAX) 500 mg in sodium chloride 0.9 % 250 mL IVPB  Status:  Discontinued        500 mg 250 mL/hr over 60  Minutes Intravenous Every 24 hours 06/13/20 0525 06/14/20 0906       CC Follow up COVID  HPI Weaned off biPAP Alert and awake Weaned off precedex Stop fluids Eating Wean off high flow [place]  Ok to Transfer to gen med floor    Results for [name], [name] (MRN [id number]) as of 06/16/2020 12:45  Ref. Range 06/13/2020 14:27 06/14/2020 05:32 06/15/2020 05:17 06/16/2020 05:22  Fibrin derivatives D-dimer ([facility]) Latest Ref Range: 0.00 - 499.00 ng/mL (FEU) 2,589.05 (H) 2,521.25 (H) 1,006.96 (H) 1,057.64 (H)   Results for [name], [name] (MRN [id number]) as of 06/16/2020 12:45  Ref. Range 06/13/2020 14:27 06/14/2020 05:32 06/15/2020 05:17 06/16/2020 05:22  CRP Latest Ref Range: <1.0 mg/dL 18.816.7 (H) 41.616.8 (H) 8.9 (H) 12.5 (H)    BP 115/72   Pulse (!) 117   Temp 99.1 F (37.3 C) (Oral)   Resp (!) 23   Ht 5\' 9"  (1.753 m)   Wt 93.2 kg   SpO2 98%   BMI 30.34 kg/m    I/O last 3 completed shifts: In: 2264.4 [P.O.:1320; I.V.:794.4; IV Piggyback:150] Out: 3603 [Urine:3601; Stool:2] No intake/output data recorded.  SpO2: 98 % O2 Flow Rate (L/min): 8 L/min FiO2 (%): 35 %  Estimated body mass index is 30.34 kg/m as calculated from the following:   Height as of this encounter: 5\' 9"  (1.753 m).   Weight as of this encounter: 93.2 kg.    Review of Systems:  Gen:  Denies  fever, sweats, chills weight loss  HEENT: Denies blurred vision, double vision, ear pain, eye pain, hearing loss, nose bleeds, sore throat Cardiac:  No dizziness, chest pain or heaviness, chest tightness,edema, No JVD Resp:   No cough, -sputum production, +shortness of breath,-wheezing, -hemoptysis,  Psych:   + hallucinations  Other:  All other systems negative  Physical Examination:   General Appearance: No distress  Neuro:without focal findings,  speech normal,  HEENT: PERRLA, EOM intact.   Pulmonary: normal breath sounds, No wheezing.  CardiovascularNormal S1,S2.  No m/r/g.      ALL OTHER ROS ARE NEGATIVE  BP  115/72   Pulse (!) 117   Temp 99.1 F (37.3 C) (Oral)   Resp (!) 23   Ht 5\' 9"  (1.753 m)   Wt 93.2 kg   SpO2 98%   BMI 30.34 kg/m   CBC    Component Value Date/Time   WBC 20.3 (H) 06/18/2020 0620   RBC 4.58 06/18/2020 0620   HGB 11.9 (L) 06/18/2020 0620   HGB 15.8 04/14/2019 1141   HCT 36.5 (L) 06/18/2020 0620   HCT 48.7 04/14/2019 1141   PLT 574 (H) 06/18/2020 0620   PLT 285 04/14/2019 1141  MCV 79.7 (L) 06/18/2020 0620   MCV 84 04/14/2019 1141   MCH 26.0 06/18/2020 0620   MCHC 32.6 06/18/2020 0620   RDW 14.5 06/18/2020 0620   RDW 13.7 04/14/2019 1141   LYMPHSABS PENDING 06/18/2020 0620   LYMPHSABS 1.3 04/14/2019 1141   MONOABS PENDING 06/18/2020 0620   EOSABS PENDING 06/18/2020 0620   EOSABS 0.1 04/14/2019 1141   BASOSABS PENDING 06/18/2020 0620   BASOSABS 0.0 04/14/2019 1141   BMP Latest Ref Rng & Units 06/18/2020 06/17/2020 06/16/2020  Glucose 70 - 99 mg/dL 622(W) 979(G) 921(J)  BUN 6 - 20 mg/dL 18 94(R) 74(Y)  Creatinine 0.61 - 1.24 mg/dL 8.14 4.81 8.56  BUN/Creat Ratio 9 - 20 - - -  Sodium 135 - 145 mmol/L 138 135 137  Potassium 3.5 - 5.1 mmol/L 3.9 4.6 4.6  Chloride 98 - 111 mmol/L 98 100 104  CO2 22 - 32 mmol/L 27 27 26   Calcium 8.9 - 10.3 mg/dL ) 8.1(L) 8.2(L)    EXAM.kkphy   LABS    Recent Labs  Lab 06/13/20 0550 06/14/20 0347  PHART  --  7.43  PCO2ART  --  36  PO2ART  --  73*  HCO3 21.6 23.9  O2SAT 34.5 94.9    CBC Recent Labs  Lab 06/16/20 0522 06/17/20 0603 06/18/20 0620  HGB 11.3* 10.8* 11.9*  HCT 34.1* 33.0* 36.5*  WBC 13.2* 12.1* 20.3*  PLT 437* 456* 574*    COAGULATION Recent Labs  Lab 06/13/20 0529  INR 1.2    CARDIAC  No results for input(s): TROPONINI in the last 168 hours. No results for input(s): PROBNP in the last 168 hours.   CHEMISTRY Recent Labs  Lab 06/14/20 0541 06/14/20 0541 06/14/20 2310 06/15/20 0517 06/15/20 0517 06/16/20 0522 06/16/20 0522 06/17/20 0603 06/18/20 0620  NA 134*   < >  140 136  --  137  --  135 138  K 4.2   < >  --  4.3   < > 4.6   < > 4.6 3.9  CL 103  --   --  104  --  104  --  100 98  CO2 22  --   --  24  --  26  --  27 27  GLUCOSE 191*  --   --  191*  --  200*  --  232* 164*  BUN 33*  --   --  33*  --  28*  --  21* 18  CREATININE 1.31*  --   --  0.96  --  1.04  --  0.99 0.90  CALCIUM 8.9  --   --  8.9  --  8.2*  --  8.1* 8.6*  MG  --   --   --  2.3  --   --   --  2.0  --   PHOS  --   --   --  3.0  --   --   --  3.9 3.1   < > = values in this interval not displayed.   Estimated Creatinine Clearance: 109.5 mL/min (by C-G formula based on SCr of 0.9 mg/dL).   LIVER Recent Labs  Lab 06/13/20 0529 06/13/20 0529 06/14/20 0541 06/15/20 0517 06/16/20 0522 06/17/20 0603 06/18/20 0620  AST 30   < > 21 15 15 16 20   ALT 24   < > 19 16 13 14 15   ALKPHOS 109   < > 96 92 88 80 82  BILITOT 0.9   < >  0.5 0.4 0.6 0.6 0.8  PROT 7.1   < > 6.1* 6.0* 5.8* 5.7* 6.7  ALBUMIN 3.0*   < > 2.4* 2.2* 2.1* 2.1* 2.4*  INR 1.2  --   --   --   --   --   --    < > = values in this interval not displayed.     INFECTIOUS Recent Labs  Lab 06/13/20 0529 06/13/20 0529 06/13/20 0734 06/14/20 1038 06/15/20 0517 06/16/20 0522  LATICACIDVEN 3.5*  --  1.6  --  1.1  --   PROCALCITON 5.07   < >  --  0.60 0.44 0.20   < > = values in this interval not displayed.     ENDOCRINE CBG (last 3)  Recent Labs    06/17/20 1943 06/18/20 0032 06/18/20 0335  GLUCAP 209* 139* 151*         IMAGING x48h  - image(s) personally visualized  -   highlighted in bold DG Chest Port 1 View  Result Date: 06/17/2020 CLINICAL DATA:  COVID pneumonia EXAM: PORTABLE CHEST 1 VIEW COMPARISON:  06/14/2020 FINDINGS: The cardiac silhouette, mediastinal and hilar contours are stable. Stable surgical changes from bypass surgery. Persistent diffuse interstitial and airspace process in the lungs consistent with COVID pneumonia. No pleural effusions or pneumothorax. IMPRESSION: Persistent  extensive interstitial and airspace process consistent with COVID pneumonia. Electronically Signed   By: Rudie Meyer M.D.   On: 06/17/2020 07:52        ASSESSMENT AND PLAN   Acute hypoxemic respiratory failure due to COVID-19 pneumonia / ARDS Slowly resolving Wean off oxygen as tolerated   NEUROLOGY Baseline - anxiety: on scheduled TCAD and prn klonopin  UDS positive for opioids (cough syrup per gf) Acute toxic metabolic encephalopathy-known hx of this while in hospital Wean off precedex  ID  - covid.  Status post remdesivir Urine Streptococcus and legionella  - 06/14/2020 - negative  PT/OT/ST Plan to transfer to [facility] in next 24-48 hrs    Maks Cavallero Santiago Glad, M.D.  [facility]  Medical Director [facility] Medical Director [facility] Cardio-Pulmonary Department

## 2020-06-19 DIAGNOSIS — J9601 Acute respiratory failure with hypoxia: Secondary | ICD-10-CM | POA: Diagnosis not present

## 2020-06-19 LAB — PHOSPHORUS: Phosphorus: 3.2 mg/dL (ref 2.5–4.6)

## 2020-06-19 LAB — GLUCOSE, CAPILLARY
Glucose-Capillary: 253 mg/dL — ABNORMAL HIGH (ref 70–99)
Glucose-Capillary: 274 mg/dL — ABNORMAL HIGH (ref 70–99)
Glucose-Capillary: 338 mg/dL — ABNORMAL HIGH (ref 70–99)
Glucose-Capillary: 186 mg/dL — ABNORMAL HIGH (ref 70–99)

## 2020-06-19 LAB — BASIC METABOLIC PANEL
Anion gap: 8 (ref 5–15)
BUN: 16 mg/dL (ref 6–20)
CO2: 28 mmol/L (ref 22–32)
Calcium: 8.3 mg/dL — ABNORMAL LOW (ref 8.9–10.3)
Chloride: 99 mmol/L (ref 98–111)
Creatinine, Ser: 0.82 mg/dL (ref 0.61–1.24)
GFR, Estimated: >60 mL/min (ref >60)
Glucose, Bld: 202 mg/dL — ABNORMAL HIGH (ref 70–99)
Potassium: 4.3 mmol/L (ref 3.5–5.1)
Sodium: 135 mmol/L (ref 135–145)

## 2020-06-19 LAB — C-REACTIVE PROTEIN: CRP: 4.5 mg/dL — ABNORMAL HIGH (ref <1.0)

## 2020-06-19 LAB — FIBRIN DERIVATIVES D-DIMER (ARMC ONLY): Fibrin derivatives D-dimer (ARMC): 449.04 ng/mL (FEU) (ref 0.00–499.00)

## 2020-06-19 LAB — FERRITIN: Ferritin: 155 ng/mL (ref 24–336)

## 2020-06-19 LAB — MAGNESIUM: Magnesium: 2.3 mg/dL (ref 1.7–2.4)

## 2020-06-19 LAB — PROCALCITONIN: Procalcitonin: <0.1 ng/mL

## 2020-06-19 MED ORDER — METOPROLOL TARTRATE 25 MG PO TABS
25.0000 mg | ORAL_TABLET | Freq: Two times a day (BID) | ORAL | Status: DC
  Administered 2020-06-19 – 2020-06-20 (×2): 25 mg via ORAL
  Filled 2020-06-19 (×2): qty 1

## 2020-06-19 MED ORDER — INSULIN GLARGINE 100 UNIT/ML ~~LOC~~ SOLN
30.0000 [IU] | Freq: Every day | SUBCUTANEOUS | Status: DC
  Administered 2020-06-20: 11:00:00 30 [IU] via SUBCUTANEOUS
  Filled 2020-06-19: qty 0.3

## 2020-06-19 MED ORDER — INSULIN ASPART 100 UNIT/ML ~~LOC~~ SOLN
5.0000 [IU] | Freq: Three times a day (TID) | SUBCUTANEOUS | Status: DC
  Administered 2020-06-19 – 2020-06-20 (×3): 5 [IU] via SUBCUTANEOUS
  Filled 2020-06-19 (×4): qty 1

## 2020-06-19 MED ORDER — DEXAMETHASONE 4 MG PO TABS
6.0000 mg | ORAL_TABLET | Freq: Every day | ORAL | Status: DC
  Administered 2020-06-20: 6 mg via ORAL
  Filled 2020-06-19: qty 2

## 2020-06-19 NOTE — Progress Notes (Signed)
Physical Therapy Treatment Patient Details Name: Mike Kline MRN: [id number] DOB: 1970-11-22 Today's Date: 06/19/2020    History of Present Illness Mike Kline is a 49 year old male who was admitted with chief complaints of respiratory difficulty and AMS. Pt was diagnosed with Covid pneumonia on 10/26. PMH inlcudes DM, HTN, diabetic retinopathy, hypercholesteremia, adhesive capsulitis, insomnia, anxiety, and anginal pain. Pt is blind in L eye.    PT Comments    Pt in bed upon arrival to room with nurse present. Pt excited to participate in PT this morning and reports that he has been feeling better. Pt received on 2L O2 which was removed prior to ambulation. Pt stood to RW and ambulated 3 laps in room with CGA initially then progressed to supervision. Pt noted to desat to 86-88%. Pt returned to bed to sit and rest shortly. Pt then ambulated 2 laps in room without AD with CGA. Pt with decreased step lengths bilaterally and was noted to stagger slightly to L and R however pt reports that he does so at baseline. Pt with decreased gait speed and pt reports that it is because he is very cautious with his ambulation regularly. Pt returned to 2L O2 and O2 sats increased to 90-96%. Pt stood at sink to perform therex to promote BLE strengthening and functional activity tolerance. At this point, recommend pt utilize RW for additional stability and safety with ambulation.   SaO2 on room air at rest = 91-94% SaO2 on room air while ambulating = 86-88% SaO2 on 2 liters of O2 while ambulating = 90-96%    Follow Up Recommendations  Home health PT     Equipment Recommendations  Rolling walker with 5" wheels;3in1 (PT)    Recommendations for Other Services       Precautions / Restrictions Precautions Precautions: Fall Restrictions Weight Bearing Restrictions: No    Mobility  Bed Mobility Overal bed mobility: Needs Assistance Bed Mobility: Supine to Sit;Sit to Supine     Supine to sit:  Supervision Sit to supine: Supervision   General bed mobility comments: took additional time to perform with increased effort however no external physical assistance needed  Transfers Overall transfer level: Needs assistance Equipment used: Rolling walker (2 wheeled) Transfers: Sit to/from Stand Sit to Stand: Supervision;Modified independent (Device/Increase time)         General transfer comment: Supervision for initial sit <> stand and pt able to perform with mod I  Ambulation/Gait Ambulation/Gait assistance: Min guard;Supervision Gait Distance (Feet): 100 Feet Assistive device: Rolling walker (2 wheeled);None Gait Pattern/deviations: Decreased step length - right;Decreased step length - left;Wide base of support Gait velocity: decreased   General Gait Details: Pt ambulated a total of 100 feet using RW initially then trialled without AD; CGA for steadying and progressed to supervision   Stairs             Wheelchair Mobility    Modified Rankin (Stroke Patients Only)       Balance Overall balance assessment: Needs assistance Sitting-balance support: No upper extremity supported;Feet supported Sitting balance-Leahy Scale: Good Sitting balance - Comments: good balance reaching within and outside of BOS   Standing balance support: Bilateral upper extremity supported;No upper extremity supported;During functional activity Standing balance-Leahy Scale: Good Standing balance comment: improved balance with BUE on RW                            Cognition Arousal/Alertness: Awake/alert Behavior During Therapy: [facility]  for tasks assessed/performed Overall Cognitive Status: Within Functional Limits for tasks assessed                                        Exercises Other Exercises Other Exercises: standing hip 3 way (flex, abd, and ext) performed at sink with bilateral UE support for balance x 20 reps each    General Comments         Pertinent Vitals/Pain Pain Assessment: No/denies pain    Home Living                      Prior Function            PT Goals (current goals can now be found in the care plan section) Acute Rehab PT Goals Patient Stated Goal: to get better and go home PT Goal Formulation: With patient Time For Goal Achievement: 07/01/20 Potential to Achieve Goals: Good Progress towards PT goals: Progressing toward goals    Frequency    Min 2X/week      PT Plan Current plan remains appropriate    Co-evaluation              AM-PAC PT "6 Clicks" Mobility   Outcome Measure  Help needed turning from your back to your side while in a flat bed without using bedrails?: None Help needed moving from lying on your back to sitting on the side of a flat bed without using bedrails?: A Lot Help needed moving to and from a bed to a chair (including a wheelchair)?: A Little Help needed standing up from a chair using your arms (e.g., wheelchair or bedside chair)?: A Little Help needed to walk in hospital room?: A Little Help needed climbing 3-5 steps with a railing? : A Little 6 Click Score: 18    End of Session Equipment Utilized During Treatment: Gait belt;Oxygen;Other (comment) (2L O2) Activity Tolerance: Patient tolerated treatment well Patient left: in bed;with call bell/phone within reach Nurse Communication: Mobility status PT Visit Diagnosis: Unsteadiness on feet (R26.81);Other abnormalities of gait and mobility (R26.89);Muscle weakness (generalized) (M62.81)     Time: 6384-6659 PT Time Calculation (min) (ACUTE ONLY): 53 min  Charges:                        Frederich Chick, SPT   Frederich Chick 06/19/2020, 3:35 PM

## 2020-06-19 NOTE — Plan of Care (Signed)

## 2020-06-19 NOTE — Progress Notes (Signed)
Bipap refused 

## 2020-06-19 NOTE — TOC Initial Note (Signed)
Transition of Care [facility]) - Initial/Assessment Note    Patient Details  Name: Mike Kline MRN: [id number] Date of Birth: 1971-04-12  [facility] CM/SW Contact:    Allayne Butcher, RN Phone Number: 06/19/2020, 3:24 PM  Clinical Narrative:                 Patient admitted to the hospital with COVID requiring supplemental O2 currently at 2L .  Patient qualifies for oxygen at home and plan for discharge tomorrow.  RNCM was able to speak with patient on the phone about discharge planning.  Patient lives in [place] with his daughter and her family.  Patient reports that he is independent at home but he does agree to home health PT.  Patient declines walker and 3 in1.   Referral for home health PT given to [facility] with [facility].  Oxygen referral given to [facility] with [facility].    Expected Discharge Plan: Home w Home Health Services Barriers to Discharge: Continued Medical Work up   Patient Goals and CMS Choice Patient states their goals for this hospitalization and ongoing recovery are:: Glad to be going home tomorrow CMS Medicare.gov Compare Post Acute Care list provided to:: Patient Choice offered to / list presented to : Patient  Expected Discharge Plan and Services Expected Discharge Plan: Home w Home Health Services   Discharge Planning Services: CM Consult Post Acute Care Choice: Home Health Living arrangements for the past 2 months: Single Family Home                 DME Arranged: Oxygen DME Agency: [facility] Date DME Agency Contacted: 06/19/20 Time DME Agency Contacted: [phone] Representative spoke with at DME Agency: Ian Malkin HH Arranged: PT HH Agency: [facility] Date HH Agency Contacted: 06/19/20 Time HH Agency Contacted: 1523 Representative spoke with at [facility] Agency: Barbara Cower  Prior Living Arrangements/Services Living arrangements for the past 2 months: Single Family Home Lives with:: Adult Children Patient language and need for interpreter  reviewed:: Yes Do you feel safe going back to the place where you live?: Yes      Need for Family Participation in Patient Care: Yes (Comment) (COVID) Care giver support system in place?: Yes (comment) (daughter)   Criminal Activity/Legal Involvement Pertinent to Current Situation/Hospitalization: No - Comment as needed  Activities of Daily Living Home Assistive Devices/Equipment: None ADL Screening (condition at time of admission) Patient's cognitive ability adequate to safely complete daily activities?: Yes Is the patient deaf or have difficulty hearing?: No Does the patient have difficulty seeing, even when wearing glasses/contacts?: Yes Does the patient have difficulty concentrating, remembering, or making decisions?: No Patient able to express need for assistance with ADLs?: No Does the patient have difficulty dressing or bathing?: No Independently performs ADLs?: Yes (appropriate for developmental age) Does the patient have difficulty walking or climbing stairs?: No Weakness of Legs: None Weakness of Arms/Hands: None  Permission Sought/Granted Permission sought to share information with : Case Manager, Family Supports Permission granted to share information with : Yes, Verbal Permission Granted  Share Information with NAME: [name]  Permission granted to share info w AGENCY: [facility], [facility]  Permission granted to share info w Relationship: daughter     Emotional Assessment   Attitude/Demeanor/Rapport: Engaged Affect (typically observed): Accepting Orientation: : Oriented to Self, Oriented to Place, Oriented to  Time, Oriented to Situation Alcohol / Substance Use: Not Applicable Psych Involvement: No (comment)  Admission diagnosis:  Respiratory failure (HCC) [J96.90] Hyponatremia [E87.1] Acute respiratory  failure with hypoxia (HCC) [J96.01] Right arm cellulitis [L03.113] Pneumonia due to COVID-19 virus [U07.1, J12.82] Patient Active Problem List   Diagnosis Date Noted   . Respiratory failure (HCC) 06/13/2020  . Upper respiratory tract infection 06/03/2020  . Anxiety 04/14/2019  . Insomnia 05/28/2016  . Adhesive capsulitis 12/17/2015  . Hypercholesterolemia 10/22/2015  . Diabetic, retinopathy, proliferative (HCC) 05/28/2015  . Diabetes mellitus type 1 with peripheral artery disease (HCC) 05/28/2015  . Hypertension   . Type I diabetes mellitus with manifestations, uncontrolled (HCC)   . GERD (gastroesophageal reflux disease)   . Nuclear sclerotic cataract 07/05/2013  . Type 1 diabetes mellitus (HCC) 05/29/2013   PCP:  Patient, No Pcp Per Pharmacy:   [facility] - [place], [place] - [address] [place] [address] Phone: [phone] Fax: [phone]  [facility] - [place], [place] - [address] [place] [address] Phone: [phone] Fax: [phone]  [facility] [address] [place] - [address] [place] [address] Phone: [phone] Fax: [phone]  [facility] [id number] [place], [place] - [address] [place] [address] Phone: [phone] Fax: [phone]     Social Determinants of Health (SDOH) Interventions    Readmission Risk Interventions Readmission Risk Prevention Plan 06/19/2020  Transportation Screening Complete  PCP or Specialist Appt within 3-5 Days Complete  HRI or Home Care Consult Complete  Social Work Consult for Recovery Care Planning/Counseling Complete  Palliative Care Screening Not Applicable  Medication Review Oceanographer) Complete  Some recent data might be hidden

## 2020-06-19 NOTE — Progress Notes (Signed)
Inpatient Diabetes Program Recommendations  AACE/ADA: New Consensus Statement on Inpatient Glycemic Control (2015)  Target Ranges:  Prepandial:   less than 140 mg/dL      Peak postprandial:   less than 180 mg/dL (1-2 hours)      Critically ill patients:  140 - 180 mg/dL   Lab Results  Component Value Date   GLUCAP 253 (H) 06/19/2020   HGBA1C 10.4 (H) 06/15/2020    Review of Glycemic Control  Diabetes history: DM1 (makes NO insulin; requires basal, correction, and carbohydrate coverage insulin) Outpatient Diabetes medications: Lantus 40 units QHS, Humalog TID with meals (1 unit per 5 grams of carbs; 1 unit for every 20 mg/dl above target glucose of 120 mg/dl) Current orders for Inpatient glycemic control: Lantus 25 units qd, Novolog 0-9 units TID with meals, Novolog 0-5 units QHS  Inpatient Diabetes Program Recommendations:    Consider: -Increase Lantus to 30 units -Add Novolog 5 units tid meal coverage if eats 50% -Decrease Novolog correction to moderate tid + hs 0-5 units  Thank you, Darel Hong E. Khya Halls, RN, MSN, CDE  Diabetes Coordinator Inpatient Glycemic Control Team Team Pager [phone] (8am-5pm) 06/19/2020 9:53 AM

## 2020-06-19 NOTE — Progress Notes (Signed)
PROGRESS NOTE    TALLEN SCHNORR  TKW:[id number] DOB: 1971-03-01 DOA: 06/13/2020 PCP: Patient, No Pcp Per    Assessment & Plan:   Active Problems:   Respiratory failure (HCC)    NEHAL SHIVES is a 49 y.o.malewith hx of diabetes mellitus, hypertension, recent diagnosis of COVID-19 on 06/04/2020 who presented to the emergency department via private vehicle that he was assisted out of secondary to apparent respiratory difficulty and altered mental status.  Admitted to ICU on biPAP and mental status changes.   Acute hypoxemic respiratory failure due to COVID-19 pneumonia / ARDS --completed Remdesivir --transfer out of ICU on 11/10 --CRP trending down PLAN: --taper steroid to oral decadron tomorrow --wean O2 as tolearted  Acute toxic metabolic encephalopathy, resolved --weaned off precedex  Hx of anxiety --cont amitriptyline  DM2 poorly controlled with diabetic retinopathy --A1c 10.4 --increase Lantus to 30u daily --add mealtime 5u TID --SSI  HTN and tachycardia --resume home metop today   DVT prophylaxis: Lovenox SQ Code Status: Full code  Family Communication:  Status is: inpatient Dispo:   The patient is from: home Anticipated d/c is to: home Anticipated d/c date is: tomorrow Patient currently is not medically stable to d/c due to: on 2L, tapering off solumedrol   Subjective and Interval History:  Pt reported feeling good.  Some cough.  Ate well.     Objective: Vitals:   06/19/20 0044 06/19/20 0452 06/19/20 0815 06/19/20 1144  BP: (!) 163/85 (!) 160/58 118/63 (!) 131/59  Pulse: 92 65 (!) 110 (!) 109  Resp: 17 17 20 18   Temp: 98.4 F (36.9 C) 98.5 F (36.9 C) 98.7 F (37.1 C) 98.4 F (36.9 C)  TempSrc:      SpO2: 98% 98% 90% 98%  Weight:      Height:       No intake or output data in the 24 hours ending 06/19/20 1741 Filed Weights   06/15/20 0439 06/17/20 0419 06/18/20 0340  Weight: 100 kg 101 kg 93.2 kg    Examination:   Constitutional:  NAD, AAOx3 HEENT: conjunctivae and lids normal, EOMI CV: No cyanosis.   RESP: normal respiratory effort, on 2L Extremities: No effusions, edema in BLE SKIN: warm, dry and intact Neuro: II - XII grossly intact.   Psych: Normal mood and affect.  Appropriate judgement and reason   Data Reviewed: I have personally reviewed following labs and imaging studies  CBC: Recent Labs  Lab 06/14/20 0532 06/15/20 0517 06/16/20 0522 06/17/20 0603 06/18/20 0620  WBC 8.6 13.5* 13.2* 12.1* 20.3*  NEUTROABS 7.0 10.9* 10.7* 9.5* 16.2*  HGB 10.1* 11.5* 11.3* 10.8* 11.9*  HCT 30.3* 34.8* 34.1* 33.0* 36.5*  MCV 76.5* 76.1* 77.3* 78.8* 79.7*  PLT 404* 456* 437* 456* 574*   Basic Metabolic Panel: Recent Labs  Lab 06/15/20 0517 06/16/20 0522 06/17/20 0603 06/18/20 0620 06/19/20 0448  NA 136 137 135 138 135  K 4.3 4.6 4.6 3.9 4.3  CL 104 104 100 98 99  CO2 24 26 27 27 28   GLUCOSE 191* 200* 232* 164* 202*  BUN 33* 28* 21* 18 16  CREATININE 0.96 1.04 0.99 0.90 0.82  CALCIUM 8.9 8.2* 8.1* 8.6* 8.3*  MG 2.3  --  2.0  --  2.3  PHOS 3.0  --  3.9 3.1 3.2   GFR: Estimated Creatinine Clearance: 120.1 mL/min (by C-G formula based on SCr of 0.82 mg/dL). Liver Function Tests: Recent Labs  Lab 06/14/20 0541 06/15/20 0517 06/16/20 0522 06/17/20 0603 06/18/20  5830  AST 21 15 15 16 20   ALT 19 16 13 14 15   ALKPHOS 96 92 88 80 82  BILITOT 0.5 0.4 0.6 0.6 0.8  PROT 6.1* 6.0* 5.8* 5.7* 6.7  ALBUMIN 2.4* 2.2* 2.1* 2.1* 2.4*   No results for input(s): LIPASE, AMYLASE in the last 168 hours. No results for input(s): AMMONIA in the last 168 hours. Coagulation Profile: Recent Labs  Lab 06/13/20 0529  INR 1.2   Cardiac Enzymes: No results for input(s): CKTOTAL, CKMB, CKMBINDEX, TROPONINI in the last 168 hours. BNP (last 3 results) No results for input(s): PROBNP in the last 8760 hours. HbA1C: No results for input(s): HGBA1C in the last 72 hours. CBG: Recent Labs  Lab 06/18/20 1644  06/18/20 2107 06/19/20 0814 06/19/20 1143 06/19/20 1701  GLUCAP 207* 207* 253* 274* 338*   Lipid Profile: No results for input(s): CHOL, HDL, LDLCALC, TRIG, CHOLHDL, LDLDIRECT in the last 72 hours. Thyroid Function Tests: No results for input(s): TSH, T4TOTAL, FREET4, T3FREE, THYROIDAB in the last 72 hours. Anemia Panel: Recent Labs    06/19/20 0448  FERRITIN 155   Sepsis Labs: Recent Labs  Lab 06/13/20 0529 06/13/20 0529 06/13/20 0734 06/14/20 1038 06/15/20 0517 06/16/20 0522 06/19/20 0448  PROCALCITON 5.07   < >  --  0.60 0.44 0.20 <0.10  LATICACIDVEN 3.5*  --  1.6  --  1.1  --   --    < > = values in this interval not displayed.    Recent Results (from the past 240 hour(s))  Blood Culture (routine x 2)     Status: None   Collection Time: 06/13/20  5:29 AM   Specimen: BLOOD  Result Value Ref Range Status   Specimen Description BLOOD RIGHT ANTECUBITAL  Final   Special Requests   Final    BOTTLES DRAWN AEROBIC AND ANAEROBIC Blood Culture adequate volume   Culture   Final    NO GROWTH 5 DAYS Performed at [facility], [address] [place], [address] [place]    Report Status 06/18/2020 FINAL  Final  Blood Culture (routine x 2)     Status: None   Collection Time: 06/13/20  5:29 AM   Specimen: BLOOD  Result Value Ref Range Status   Specimen Description BLOOD RIGHT ANTECUBITAL  Final   Special Requests   Final    BOTTLES DRAWN AEROBIC AND ANAEROBIC Blood Culture adequate volume   Culture   Final    NO GROWTH 5 DAYS Performed at [facility], [address], [place], [address] [place]    Report Status 06/18/2020 FINAL  Final  Urine culture     Status: None   Collection Time: 06/13/20  5:29 AM   Specimen: In/Out Cath Urine  Result Value Ref Range Status   Specimen Description   Final    IN/OUT CATH URINE Performed at [facility], [address], [place], [address] [place]    Special Requests   Final    NONE Performed at  [facility], [address], [place], [address] [place]    Culture   Final    NO GROWTH Performed at [facility], [address] [place], [address] [place]    Report Status 06/14/2020 FINAL  Final  MRSA PCR Screening     Status: None   Collection Time: 06/13/20  9:32 AM   Specimen: Nasal Mucosa; Nasopharyngeal  Result Value Ref Range Status   MRSA by PCR NEGATIVE NEGATIVE Final  Comment:        The GeneXpert MRSA Assay (FDA approved for NASAL specimens only), is one component of a comprehensive MRSA colonization surveillance program. It is not intended to diagnose MRSA infection nor to guide or monitor treatment for MRSA infections. Performed at [facility], [address] [place], [place] [address]       Radiology Studies: No results found.   Scheduled Meds: . amitriptyline  100 mg Oral QHS  . Chlorhexidine Gluconate Cloth  6 each Topical Daily  . [START ON 06/20/2020] dexamethasone  6 mg Oral Daily  . enoxaparin (LOVENOX) injection  0.5 mg/kg Subcutaneous Q24H  . famotidine  20 mg Oral Daily  . gabapentin  300 mg Oral BID  . insulin aspart  0-20 Units Subcutaneous TID AC & HS  . insulin aspart  5 Units Subcutaneous TID WC  . [START ON 06/20/2020] insulin glargine  30 Units Subcutaneous Daily  . mouth rinse  15 mL Mouth Rinse BID  . metoprolol tartrate  25 mg Oral BID  . sodium chloride flush  3 mL Intravenous Q12H   Continuous Infusions: . sodium chloride Stopped (06/18/20 1215)     LOS: 6 days     Darlin Priestly, MD [facility] If 7PM-7AM, please contact night-coverage 06/19/2020, 5:41 PM

## 2020-06-20 DIAGNOSIS — J9601 Acute respiratory failure with hypoxia: Secondary | ICD-10-CM | POA: Diagnosis not present

## 2020-06-20 LAB — BASIC METABOLIC PANEL
Anion gap: 6 (ref 5–15)
BUN: 20 mg/dL (ref 6–20)
CO2: 29 mmol/L (ref 22–32)
Calcium: 8.1 mg/dL — ABNORMAL LOW (ref 8.9–10.3)
Chloride: 102 mmol/L (ref 98–111)
Creatinine, Ser: 0.99 mg/dL (ref 0.61–1.24)
GFR, Estimated: >60 mL/min (ref >60)
Glucose, Bld: 98 mg/dL (ref 70–99)
Potassium: 4.2 mmol/L (ref 3.5–5.1)
Sodium: 137 mmol/L (ref 135–145)

## 2020-06-20 LAB — MAGNESIUM: Magnesium: 2.2 mg/dL (ref 1.7–2.4)

## 2020-06-20 LAB — CBC
HCT: 30.3 % — ABNORMAL LOW (ref 39.0–52.0)
Hemoglobin: 9.9 g/dL — ABNORMAL LOW (ref 13.0–17.0)
MCH: 26.3 pg (ref 26.0–34.0)
MCHC: 32.7 g/dL (ref 30.0–36.0)
MCV: 80.6 fL (ref 80.0–100.0)
Platelets: 431 10*3/uL — ABNORMAL HIGH (ref 150–400)
RBC: 3.76 MIL/uL — ABNORMAL LOW (ref 4.22–5.81)
RDW: 14.4 % (ref 11.5–15.5)
WBC: 12.8 10*3/uL — ABNORMAL HIGH (ref 4.0–10.5)
nRBC: 0 % (ref 0.0–0.2)

## 2020-06-20 LAB — GLUCOSE, CAPILLARY
Glucose-Capillary: 172 mg/dL — ABNORMAL HIGH (ref 70–99)
Glucose-Capillary: 183 mg/dL — ABNORMAL HIGH (ref 70–99)

## 2020-06-20 LAB — C-REACTIVE PROTEIN: CRP: 2 mg/dL — ABNORMAL HIGH (ref <1.0)

## 2020-06-20 MED ORDER — DEXAMETHASONE 6 MG PO TABS: 6.0000 mg | ORAL_TABLET | Freq: Every day | ORAL | 0 refills | Status: AC

## 2020-06-20 MED ORDER — GABAPENTIN 300 MG PO CAPS
300.0000 mg | ORAL_CAPSULE | Freq: Two times a day (BID) | ORAL | Status: DC
Start: 2020-06-20 — End: 2020-08-06

## 2020-06-20 MED ORDER — LANTUS SOLOSTAR 100 UNIT/ML ~~LOC~~ SOPN: 40.0000 [IU] | PEN_INJECTOR | Freq: Every day | SUBCUTANEOUS | Status: AC

## 2020-06-20 NOTE — Discharge Summary (Signed)
Physician Discharge Summary   Mike Kline  male DOB: 11-11-1970  XWR:[id number]  PCP: Patient, No Pcp Per  Admit date: 06/13/2020 Discharge date: 06/20/2020  Admitted From: home Disposition:  home Home Health: Yes CODE STATUS: Full code  Discharge Instructions    Discharge instructions   Complete by: As directed    You have finished treatment for COVID infection, and have improved, so you can go home to continue to recover.  You are send home with 2 liters of oxygen to be used when you walk around or exert yourself.  Please follow up with your outpatient doctor to monitor your oxygen needs.  Please take 5 more days of steroid Decadron as directed to calm inflammation.  Your diabetes is not well controlled.  Please follow up with your outpatient doctor for further management.   Dr. Darlin Priestly - -   No wound care   Complete by: As directed        Hospital Course:  For full details, please see H&P, progress notes, consult notes and ancillary notes.  Briefly,  Mike Kline is a 49 y.o.malewith hx of diabetes mellitus, hypertension, recent diagnosis of COVID-19 on 06/04/2020 who presented to the emergency department via private vehicle that he was assisted out of secondary to apparent respiratory difficulty and altered mental status.  On presentation, pt was in respiratory distress, sating 89% on 2L, later required BiPAP and admitted to ICU.   Acute hypoxemic respiratory failure due to COVID-19 pneumonia / ARDS Pt completed Remdesivir treatment and transfered out of ICU on 11/10.  Pt received solumedrol 40 mg BID which was tapered down to decadron.  CRP was 16.7 on presentation, and trended down to 2.0 prior to discharge.  Pt was discharged with 5 more days of Decadron.  Prior to discharge, pt was sating well on room air at rest, however, with walking, O2 sat dropped to 86-88%, requiring 2L O2.  Pt was discharge home with 2L supplemental O2 and instructed to follow up with  PCP to titrate off O2 as he recovers.  Acute toxic metabolic encephalopathy, resolved Pt was weaned off precedex prior to transferring out of ICU.  Mental status back to baseline several days prior to discharge.  Hx of anxiety Continued home amitriptyline  DM2 poorly controlled with diabetic retinopathy A1c 10.4.  Final insulin regimen in the hospital was Lantus to 30u daily, mealtime 5u TID and SSI, however, pt was discharged back to his home insulin regimen and advised to follow up with his PCP for further management.  HTN and tachycardia home metop resumed prior to discharge.  Home losartan resumed after discharge.   Discharge Diagnoses:  Active Problems:   Respiratory failure [facility])    Discharge Instructions:  Allergies as of 06/20/2020   No Known Allergies     Medication List    STOP taking these medications   albuterol 108 (90 Base) MCG/ACT inhaler Commonly known as: Proventil HFA   benzonatate 200 MG capsule Commonly known as: TESSALON   levofloxacin 750 MG tablet Commonly known as: LEVAQUIN   sildenafil 100 MG tablet Commonly known as: Viagra   traZODone 50 MG tablet Commonly known as: DESYREL     TAKE these medications   amitriptyline 100 MG tablet Commonly known as: ELAVIL Take 1 tablet (100 mg total) by mouth at bedtime.   dexamethasone 6 MG tablet Commonly known as: DECADRON Take 1 tablet (6 mg total) by mouth daily for 5 days. Steroid for anti-inflammation. Start  taking on: June 21, 2020   gabapentin 300 MG capsule Commonly known as: NEURONTIN Take 1 capsule (300 mg total) by mouth 2 (two) times daily.   guaiFENesin-codeine 100-10 MG/5ML syrup Take 5 mLs by mouth every 12 (twelve) hours as needed for cough. To take when Tussionex prescription runs out.  Do not take both at the same time.   HumaLOG KwikPen 100 UNIT/ML KwikPen Generic drug: insulin lispro USE THREE TIMES A DAY AS DIRECTED.(UP TO 50 UNITS A DAY OR AS DIRECTED BY  SLIDING SCALE) What changed: See the new instructions.   Lantus SoloStar 100 UNIT/ML Solostar Pen Generic drug: insulin glargine Inject 40 Units into the skin daily at 10 pm.   losartan 25 MG tablet Commonly known as: COZAAR Take 25 mg by mouth daily.   metoprolol tartrate 25 MG tablet Commonly known as: LOPRESSOR Take 25 mg by mouth 2 (two) times daily.   pantoprazole 40 MG tablet Commonly known as: PROTONIX Take 1 tablet (40 mg total) by mouth daily.   pramipexole 0.125 MG tablet Commonly known as: MIRAPEX Take 1 tablet (0.125 mg total) by mouth every evening.   rosuvastatin 40 MG tablet Commonly known as: CRESTOR Take 40 mg by mouth daily.   tadalafil 10 MG tablet Commonly known as: CIALIS Take 10 mg by mouth daily as needed.            Durable Medical Equipment  (From admission, onward)         Start     Ordered   06/20/20 0903  For home use only DME oxygen  Once       Question Answer Comment  Length of Need 6 Months   Mode or (Route) Nasal cannula   Liters per Minute 2   Frequency Continuous (stationary and portable oxygen unit needed)   Oxygen delivery system Gas      06/20/20 0902           Follow-up Information    Your primary care doctor. Schedule an appointment as soon as possible for a visit in 1 week(s).               No Known Allergies   The results of significant diagnostics from this hospitalization (including imaging, microbiology, ancillary and laboratory) are listed below for reference.   Consultations:   Procedures/Studies: DG Chest 2 View  Result Date: 06/07/2020 CLINICAL DATA:  Cough EXAM: CHEST - 2 VIEW COMPARISON:  04/14/2019 FINDINGS: Mild bilateral interstitial thickening. No focal consolidation. No pleural effusion or pneumothorax. Heart and mediastinal contours are unremarkable. Interval CABG. No acute osseous abnormality. IMPRESSION: No active cardiopulmonary disease. Electronically Signed   By: Elige Ko    On: 06/07/2020 14:54   DG Chest Port 1 View  Result Date: 06/17/2020 CLINICAL DATA:  COVID pneumonia EXAM: PORTABLE CHEST 1 VIEW COMPARISON:  06/14/2020 FINDINGS: The cardiac silhouette, mediastinal and hilar contours are stable. Stable surgical changes from bypass surgery. Persistent diffuse interstitial and airspace process in the lungs consistent with COVID pneumonia. No pleural effusions or pneumothorax. IMPRESSION: Persistent extensive interstitial and airspace process consistent with COVID pneumonia. Electronically Signed   By: Rudie Meyer M.D.   On: 06/17/2020 07:52   DG Chest Port 1 View  Result Date: 06/14/2020 CLINICAL DATA:  Difficulty breathing with cough and history of COVID EXAM: PORTABLE CHEST 1 VIEW COMPARISON:  Yesterday FINDINGS: Cardiomegaly. Prior CABG and left atrial clipping. Diffuse interstitial and airspace opacity. No visible effusion or air leak. Remote rib fractures.  IMPRESSION: Bilateral pulmonary opacity correlating with history of COVID. There is a prominent interstitial component with cardiomegaly, possible superimposed edema. Electronically Signed   By: Marnee Spring M.D.   On: 06/14/2020 04:59   DG Chest Port 1 View  Result Date: 06/13/2020 CLINICAL DATA:  Evaluate for sepsis. EXAM: PORTABLE CHEST 1 VIEW COMPARISON:  06/05/2020 FINDINGS: Previous median sternotomy and CABG procedure. Stable cardiomediastinal contours. Interval increase in diffuse, bilateral interstitial and airspace densities. No lobar consolidation. Visualized osseous structures are unremarkable. IMPRESSION: Interval increase in bilateral interstitial and airspace densities compatible with worsening pulmonary edema versus multifocal infection. Electronically Signed   By: Signa Kell M.D.   On: 06/13/2020 06:01   ECHOCARDIOGRAM COMPLETE  Result Date: 06/17/2020    ECHOCARDIOGRAM REPORT   Patient Name:   Mike Kline Date of Exam: 06/14/2020 Medical Rec #:  [id number]     Height:       69.0 in  Accession #:    [id number]    Weight:       220.5 lb Date of Birth:  03-21-1971      BSA:          2.153 m Patient Age:    49 years      BP:           142/85 mmHg Patient Gender: M             HR:           79 bpm. Exam Location:  [facility] Procedure: 2D Echo, Cardiac Doppler and Color Doppler Indications:     Acute respiratory insufficiency 518.82  History:         Patient has no prior history of Echocardiogram examinations.                  Risk Factors:Hypertension, Diabetes and Dyslipidemia.  Sonographer:     Cristela Blue RDCS (AE) Referring Phys:  [id number] Diagnosing Phys: Adrian Blackwater MD  Sonographer Comments: No apical window and no subcostal window. IMPRESSIONS  1. Left ventricular ejection fraction, by estimation, is 55 to 60%. The left ventricle has normal function. The left ventricle has no regional wall motion abnormalities. Left ventricular diastolic parameters are consistent with Grade I diastolic dysfunction (impaired relaxation).  2. Right ventricular systolic function is normal. The right ventricular size is normal.  3. Left atrial size was mildly dilated.  4. Right atrial size was mildly dilated.  5. The mitral valve is normal in structure. Trivial mitral valve regurgitation. No evidence of mitral stenosis.  6. The aortic valve is normal in structure. Aortic valve regurgitation is not visualized. No aortic stenosis is present.  7. The inferior vena cava is normal in size with greater than 50% respiratory variability, suggesting right atrial pressure of 3 mmHg. FINDINGS  Left Ventricle: Left ventricular ejection fraction, by estimation, is 55 to 60%. The left ventricle has normal function. The left ventricle has no regional wall motion abnormalities. The left ventricular internal cavity size was normal in size. There is  no left ventricular hypertrophy. Left ventricular diastolic parameters are consistent with Grade I diastolic dysfunction (impaired relaxation). Right Ventricle: The right  ventricular size is normal. No increase in right ventricular wall thickness. Right ventricular systolic function is normal. Left Atrium: Left atrial size was mildly dilated. Right Atrium: Right atrial size was mildly dilated. Pericardium: There is no evidence of pericardial effusion. Mitral Valve: The mitral valve is normal in structure. Trivial mitral valve regurgitation. No evidence of  mitral valve stenosis. Tricuspid Valve: The tricuspid valve is normal in structure. Tricuspid valve regurgitation is mild . No evidence of tricuspid stenosis. Aortic Valve: The aortic valve is normal in structure. Aortic valve regurgitation is not visualized. No aortic stenosis is present. Pulmonic Valve: The pulmonic valve was normal in structure. Pulmonic valve regurgitation is not visualized. No evidence of pulmonic stenosis. Aorta: The aortic root is normal in size and structure. Venous: The inferior vena cava is normal in size with greater than 50% respiratory variability, suggesting right atrial pressure of 3 mmHg. IAS/Shunts: No atrial level shunt detected by color flow Doppler.  LEFT VENTRICLE PLAX 2D LVIDd:         4.88 cm LVIDs:         3.37 cm LV PW:         1.10 cm LV IVS:        1.07 cm LVOT diam:     2.00 cm LVOT Area:     3.14 cm  LEFT ATRIUM         Index LA diam:    3.90 cm 1.81 cm/m                        PULMONIC VALVE AORTA                 PV Vmax:        1.02 m/s Ao Root diam: 2.60 cm PV Peak grad:   4.2 mmHg                       RVOT Peak grad: 3 mmHg   SHUNTS Systemic Diam: 2.00 cm Adrian Blackwater MD Electronically signed by Adrian Blackwater MD Signature Date/Time: 06/17/2020/3:39:05 PM    Final       Labs: BNP (last 3 results) Recent Labs    06/13/20 0550  BNP 114.2*   Basic Metabolic Panel: Recent Labs  Lab 06/15/20 0517 06/15/20 0517 06/16/20 0522 06/17/20 0603 06/18/20 0620 06/19/20 0448 06/20/20 0556  NA 136   < > 137 135 138 135 137  K 4.3   < > 4.6 4.6 3.9 4.3 4.2  CL 104   < > 104  100 98 99 102  CO2 24   < > 26 27 27 28 29   GLUCOSE 191*   < > 200* 232* 164* 202* PENDING  BUN 33*   < > 28* 21* 18 16 20   CREATININE 0.96   < > 1.04 0.99 0.90 0.82 0.99  CALCIUM 8.9   < > 8.2* 8.1* 8.6* 8.3* 8.1*  MG 2.3  --   --  2.0  --  2.3 2.2  PHOS 3.0  --   --  3.9 3.1 3.2  --    < > = values in this interval not displayed.   Liver Function Tests: Recent Labs  Lab 06/14/20 0541 06/15/20 0517 06/16/20 0522 06/17/20 0603 06/18/20 0620  AST 21 15 15 16 20   ALT 19 16 13 14 15   ALKPHOS 96 92 88 80 82  BILITOT 0.5 0.4 0.6 0.6 0.8  PROT 6.1* 6.0* 5.8* 5.7* 6.7  ALBUMIN 2.4* 2.2* 2.1* 2.1* 2.4*   No results for input(s): LIPASE, AMYLASE in the last 168 hours. No results for input(s): AMMONIA in the last 168 hours. CBC: Recent Labs  Lab 06/14/20 0532 06/14/20 0532 06/15/20 0517 06/16/20 0522 06/17/20 0603 06/18/20 0620 06/20/20 0556  WBC 8.6   < >  13.5* 13.2* 12.1* 20.3* 12.8*  NEUTROABS 7.0  --  10.9* 10.7* 9.5* 16.2*  --   HGB 10.1*   < > 11.5* 11.3* 10.8* 11.9* 9.9*  HCT 30.3*   < > 34.8* 34.1* 33.0* 36.5* 30.3*  MCV 76.5*   < > 76.1* 77.3* 78.8* 79.7* 80.6  PLT 404*   < > 456* 437* 456* 574* 431*   < > = values in this interval not displayed.   Cardiac Enzymes: No results for input(s): CKTOTAL, CKMB, CKMBINDEX, TROPONINI in the last 168 hours. BNP: Invalid input(s): POCBNP CBG: Recent Labs  Lab 06/19/20 0814 06/19/20 1143 06/19/20 1701 06/19/20 2055 06/20/20 0812  GLUCAP 253* 274* 338* 186* 172*   D-Dimer No results for input(s): DDIMER in the last 72 hours. Hgb A1c No results for input(s): HGBA1C in the last 72 hours. Lipid Profile No results for input(s): CHOL, HDL, LDLCALC, TRIG, CHOLHDL, LDLDIRECT in the last 72 hours. Thyroid function studies No results for input(s): TSH, T4TOTAL, T3FREE, THYROIDAB in the last 72 hours.  Invalid input(s): FREET3 Anemia work up Recent Labs    06/19/20 0448  FERRITIN 155   Urinalysis    Component  Value Date/Time   COLORURINE YELLOW (A) 06/13/2020 0529   APPEARANCEUR CLEAR (A) 06/13/2020 0529   APPEARANCEUR Clear 05/28/2016 1312   LABSPEC 1.017 06/13/2020 0529   PHURINE 5.0 06/13/2020 0529   GLUCOSEU >=500 (A) 06/13/2020 0529   HGBUR NEGATIVE 06/13/2020 0529   BILIRUBINUR NEGATIVE 06/13/2020 0529   BILIRUBINUR Negative 05/28/2016 1312   KETONESUR NEGATIVE 06/13/2020 0529   PROTEINUR 30 (A) 06/13/2020 0529   NITRITE NEGATIVE 06/13/2020 0529   LEUKOCYTESUR NEGATIVE 06/13/2020 0529   Sepsis Labs Invalid input(s): PROCALCITONIN,  WBC,  LACTICIDVEN Microbiology Recent Results (from the past 240 hour(s))  Blood Culture (routine x 2)     Status: None   Collection Time: 06/13/20  5:29 AM   Specimen: BLOOD  Result Value Ref Range Status   Specimen Description BLOOD RIGHT ANTECUBITAL  Final   Special Requests   Final    BOTTLES DRAWN AEROBIC AND ANAEROBIC Blood Culture adequate volume   Culture   Final    NO GROWTH 5 DAYS Performed at [facility], [address] [place], [place] [address]    Report Status 06/18/2020 FINAL  Final  Blood Culture (routine x 2)     Status: None   Collection Time: 06/13/20  5:29 AM   Specimen: BLOOD  Result Value Ref Range Status   Specimen Description BLOOD RIGHT ANTECUBITAL  Final   Special Requests   Final    BOTTLES DRAWN AEROBIC AND ANAEROBIC Blood Culture adequate volume   Culture   Final    NO GROWTH 5 DAYS Performed at [facility], [address], [place], [place] [address]    Report Status 06/18/2020 FINAL  Final  Urine culture     Status: None   Collection Time: 06/13/20  5:29 AM   Specimen: In/Out Cath Urine  Result Value Ref Range Status   Specimen Description   Final    IN/OUT CATH URINE Performed at [facility], [address], [place], [place] [address]    Special Requests   Final    NONE Performed at [facility], [address], [place], [place] [address]    Culture   Final     NO GROWTH Performed at [facility], [address], [place], [place] [address]    Report Status 06/14/2020  FINAL  Final  MRSA PCR Screening     Status: None   Collection Time: 06/13/20  9:32 AM   Specimen: Nasal Mucosa; Nasopharyngeal  Result Value Ref Range Status   MRSA by PCR NEGATIVE NEGATIVE Final    Comment:        The GeneXpert MRSA Assay (FDA approved for NASAL specimens only), is one component of a comprehensive MRSA colonization surveillance program. It is not intended to diagnose MRSA infection nor to guide or monitor treatment for MRSA infections. Performed at [facility], [address] [place], [place] [id number]      Total time spend on discharging this patient, including the last patient exam, discussing the hospital stay, instructions for ongoing care as it relates to all pertinent caregivers, as well as preparing the medical discharge records, prescriptions, and/or referrals as applicable, is 45 minutes.    Darlin Priestlyina Gene Glazebrook, MD  [facility] 06/20/2020, 9:11 AM

## 2020-06-20 NOTE — Plan of Care (Signed)
  Problem: Education: Goal: Knowledge of risk factors and measures for prevention of condition will improve Outcome: Adequate for Discharge   Problem: Coping: Goal: Psychosocial and spiritual needs will be supported Outcome: Adequate for Discharge   Problem: Respiratory: Goal: Will maintain a patent airway Outcome: Adequate for Discharge Goal: Complications related to the disease process, condition or treatment will be avoided or minimized Outcome: Adequate for Discharge   Problem: Education: Goal: Knowledge of General Education information will improve Description: Including pain rating scale, medication(s)/side effects and non-pharmacologic comfort measures Outcome: Adequate for Discharge   

## 2020-06-20 NOTE — TOC Transition Note (Signed)
Transition of Care [facility]) - CM/SW Discharge Note   Patient Details  Name: Mike Kline MRN: [id number] Date of Birth: Jan 28, 1971  Transition of Care [facility]) CM/SW Contact:  Allayne Butcher, RN Phone Number: 06/20/2020, 10:12 AM   Clinical Narrative:    Patient is medically cleared to discharge home with home health care through [facility].  Barbara Cower with [facility] aware of discharge for today and orders are in for home health RN and PT.  Oxygen has been delivered to the patient's room for him to go home with.  Patient's daughter [name] is going to buy the patient a pulse oximeter to monitor his oxygen at home.    Final next level of care: Home w Home Health Services Barriers to Discharge: Barriers Resolved   Patient Goals and CMS Choice Patient states their goals for this hospitalization and ongoing recovery are:: Glad to be going home tomorrow CMS Medicare.gov Compare Post Acute Care list provided to:: Patient Choice offered to / list presented to : Patient  Discharge Placement                       Discharge Plan and Services   Discharge Planning Services: CM Consult Post Acute Care Choice: Home Health          DME Arranged: Oxygen DME Agency: [facility] Date DME Agency Contacted: 06/20/20 Time DME Agency Contacted: 0900 Representative spoke with at DME Agency: Ian Malkin HH Arranged: RN, PT HH Agency: [facility] Date HH Agency Contacted: 06/20/20 Time HH Agency Contacted: 1011 Representative spoke with at [facility] Agency: Barbara Cower  Social Determinants of Health (SDOH) Interventions     Readmission Risk Interventions Readmission Risk Prevention Plan 06/19/2020  Transportation Screening Complete  PCP or Specialist Appt within 3-5 Days Complete  HRI or Home Care Consult Complete  Social Work Consult for Recovery Care Planning/Counseling Complete  Palliative Care Screening Not Applicable  Medication Review [employer]) Complete  Some recent  data might be hidden

## 2020-06-20 NOTE — Progress Notes (Signed)
PIV removed, AVS explained- no further questions. Home O2 delivered & education provided no further questions. Will d/c home to family via wheelchair.

## 2020-06-23 ENCOUNTER — Encounter: Payer: Self-pay | Admitting: Nurse Practitioner

## 2020-06-24 ENCOUNTER — Telehealth: Payer: Self-pay | Admitting: Nurse Practitioner

## 2020-06-24 NOTE — Telephone Encounter (Signed)
Called and gave verbal orders per Jessica.  

## 2020-06-24 NOTE — Telephone Encounter (Signed)
Home Health Verbal Orders - Caller/Agency: Azalia Bilis [facility]  Callback Number: [phone] secure vm can be left  Requesting Skilled Nursing Frequency: 2x's a week for 1 week 1 x a week for 3 weeks And  2 PRN

## 2020-06-24 NOTE — Telephone Encounter (Signed)
Okay for verbal 

## 2020-06-24 NOTE — Telephone Encounter (Signed)
Routing to provider  

## 2020-06-24 NOTE — Telephone Encounter (Signed)
Yes - Please reach out to schedule Hospital f/u

## 2020-06-25 ENCOUNTER — Other Ambulatory Visit: Payer: Self-pay | Admitting: Nurse Practitioner

## 2020-06-25 MED ORDER — AMITRIPTYLINE HCL 100 MG PO TABS: 100.0000 mg | ORAL_TABLET | Freq: Every day | ORAL | 0 refills | Status: DC

## 2020-06-25 NOTE — Telephone Encounter (Signed)
Unsure if you are able to see the message as it was sent to me directly via mychart but I will copy the question the medication  Pt's wife had questions on "What about the Amitryptiline refill until then? It just really helps with the anxiety and adjustment post hospitalization and sleep and keeps him good. Please let us know about virtual and the meds. He also had a home health care nurse come Sunday to check everything out and that was good to go too!"

## 2020-06-25 NOTE — Telephone Encounter (Signed)
Refill of amitriptyline sent to [facility]

## 2020-06-25 NOTE — Telephone Encounter (Signed)
Can this hosp f.u be virtual?Please advise message below on medication questions.

## 2020-06-28 NOTE — Telephone Encounter (Signed)
Can this Hospital f.u be virtual?

## 2020-06-28 NOTE — Telephone Encounter (Signed)
Absolutely.

## 2020-07-02 ENCOUNTER — Encounter: Payer: Self-pay | Admitting: Nurse Practitioner

## 2020-07-02 ENCOUNTER — Telehealth (INDEPENDENT_AMBULATORY_CARE_PROVIDER_SITE_OTHER): Payer: Medicare Other | Admitting: Nurse Practitioner

## 2020-07-02 VITALS — BP 137/77 | HR 82 | Temp 98.2°F | Ht 67.0 in | Wt 194.6 lb

## 2020-07-02 DIAGNOSIS — G4709 Other insomnia: Secondary | ICD-10-CM | POA: Diagnosis not present

## 2020-07-02 DIAGNOSIS — F32A Depression, unspecified: Secondary | ICD-10-CM | POA: Insufficient documentation

## 2020-07-02 DIAGNOSIS — K219 Gastro-esophageal reflux disease without esophagitis: Secondary | ICD-10-CM

## 2020-07-02 DIAGNOSIS — H5440 Blindness, one eye, unspecified eye: Secondary | ICD-10-CM | POA: Insufficient documentation

## 2020-07-02 DIAGNOSIS — R1010 Upper abdominal pain, unspecified: Secondary | ICD-10-CM

## 2020-07-02 MED ORDER — AMITRIPTYLINE HCL 100 MG PO TABS: 100.0000 mg | ORAL_TABLET | Freq: Every day | ORAL | 1 refills | Status: DC

## 2020-07-02 MED ORDER — FAMOTIDINE 20 MG PO TABS: 20.0000 mg | ORAL_TABLET | Freq: Every day | ORAL | 1 refills | Status: DC

## 2020-07-02 NOTE — Assessment & Plan Note (Signed)
Chronic, ongoing.  Instructed not to take both pantoprazole and omeprazole.  Will start famotidine 20 mg at night and place referral to general surgery for ongoing abdominal pain and regurgitation of food.  Had seen a general surgeon at [facility] about 1 year ago-will place referral back to the same provider.  Follow-up in 4 weeks if no improvement and has not gotten with general surgery yet.

## 2020-07-02 NOTE — Assessment & Plan Note (Signed)
Chronic, stable and amitriptyline 100 mg nightly.  We will continue this medication-refill sent into pharmacy.  Follow-up in 6 months.

## 2020-07-02 NOTE — Progress Notes (Signed)
BP 137/77   Pulse 82   Temp 98.2 F (36.8 C)   Ht 5\' 7"  (1.702 m)   Wt 194 lb 9.6 oz (88.3 kg)   SpO2 97%   BMI 30.48 kg/m    Subjective:    Patient ID: , male    DOB: March 06, 1971, 49 y.o.   MRN: [id number]  HPI: Mike Kline is a 49 y.o. male presenting for hospital follow up.    Chief Complaint  Patient presents with  . Hospitalization Follow-up    doing better but still tired.  . Hiatal Hernia    would like to get this fixed, it is starting to get uncomfortable   Transition of Care Hospital Follow up.   Reports is tired, but trying hard to get energy back.  Has been working with physical therapy.  Hospital/Facility: [facility] D/C Physician: 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  Throws up at random times.  When he eats, he gets a lot of gas pressure. Thinks due to "hernia" that was diagnosed last year before heart troubles. Duration: years Onset: sudden Severity: moderate Quality: pressure, gas Location:  epigastric  Episode duration:  Radiation: no Frequency: after eating Alleviating factors: nothing Aggravating factors: eating Status: worse Treatments attempted: pantoprazole and omeprazole Fever: no Nausea: yes Vomiting: yes Weight loss: no Decreased appetite: no Diarrhea: no Constipation: no Blood in stool: no Heartburn: yes Jaundice: no Rash: no Dysuria/urinary  frequency: no Hematuria: no  History of sexually transmitted disease: no  GERD Currently taking pantoprazole 40 mg daily and omeprazole 20 mg as needed.  GERD control status: uncontrolled Satisfied with current treatment? no Heartburn frequency: daily Medication side effects: no  Medication compliance: excellent Previous GERD medications: omeprazole Antacid use frequency:  dailiy Dysphagia: no Odynophagia:  no Hematemesis: no Blood in stool: no EGD: no  INSOMNIA Currently taking amitryptyline 100 mg nightly to help both with sleep and anxiety and reports this medication works well for both. Duration: chronic Satisfied with sleep quality: yes Difficulty falling asleep: no Difficulty staying asleep: no Waking a few hours after sleep onset: no Early morning awakenings: no Daytime hypersomnolence: no Wakes feeling refreshed: no Good sleep hygiene: no Apnea: no Snoring: no Depressed/anxious mood: yes Recent stress: yes Restless legs/nocturnal leg cramps: no Chronic pain/arthritis: no History of sleep study: no Treatments attempted: trazodone, amitriptyline    Outpatient Encounter Medications as of 07/02/2020  Medication Sig  . amitriptyline (ELAVIL) 100 MG tablet Take 1 tablet (100 mg total) by mouth at bedtime.  . gabapentin (NEURONTIN) 300 MG capsule Take 1 capsule (300 mg total) by mouth 2 (two) times daily.  07/04/2020 HUMALOG KWIKPEN 100 UNIT/ML KiwkPen USE THREE TIMES A DAY AS DIRECTED.(UP TO 50 UNITS A DAY OR AS DIRECTED BY SLIDING SCALE) (Patient taking differently: Inject 6-10 Units into the skin 3 (three) times daily. )  . insulin glargine (LANTUS SOLOSTAR) 100 UNIT/ML Solostar Pen Inject 40 Units into the skin daily at 10 pm.  .  losartan (COZAAR) 25 MG tablet Take 25 mg by mouth daily.  . metoprolol tartrate (LOPRESSOR) 25 MG tablet Take 25 mg by mouth 2 (two) times daily.  . pantoprazole (PROTONIX) 40 MG tablet Take 1 tablet (40 mg total) by mouth daily.  . pramipexole  (MIRAPEX) 0.125 MG tablet Take 1 tablet (0.125 mg total) by mouth every evening.  . rosuvastatin (CRESTOR) 40 MG tablet Take 40 mg by mouth daily.  . tadalafil (CIALIS) 10 MG tablet Take 10 mg by mouth daily as needed.  . [DISCONTINUED] amitriptyline (ELAVIL) 100 MG tablet Take 1 tablet (100 mg total) by mouth at bedtime.  . famotidine (PEPCID) 20 MG tablet Take 1 tablet (20 mg total) by mouth at bedtime.  . [DISCONTINUED] guaiFENesin-codeine 100-10 MG/5ML syrup Take 5 mLs by mouth every 12 (twelve) hours as needed for cough. To take when Tussionex prescription runs out.  Do not take both at the same time.   No facility-administered encounter medications on file as of 07/02/2020.    Diagnostic Tests Reviewed/Disposition: cleared for discharge 11/11 on home oxygen  Chest x- ray while in hospital showed COVID pneumonia, also had echocardiogram that showed EF 55-60%,   Consults: critical care  Discharge Instructions: continue home oxygen for now, follow up with PCP, Cardiologist, and Endocrinologist  Disease/illness Education: completed  Home Health/Community Services Discussions/Referrals: has home health set up; has home O2 but has not been requiring it.  Establishment or re-establishment of referral orders for community resources: not needed  Discussion with other health care providers: n/a  Assessment and Support of treatment regimen adherence: completed  Appointments Coordinated with: patient and friend  Education for self-management, independent living, and ADLs: completed  No Known Allergies  Outpatient Encounter Medications as of 07/02/2020  Medication Sig  . amitriptyline (ELAVIL) 100 MG tablet Take 1 tablet (100 mg total) by mouth at bedtime.  . gabapentin (NEURONTIN) 300 MG capsule Take 1 capsule (300 mg total) by mouth 2 (two) times daily.  Marland Kitchen. HUMALOG KWIKPEN 100 UNIT/ML KiwkPen USE THREE TIMES A DAY AS DIRECTED.(UP TO 50 UNITS A DAY OR AS DIRECTED BY SLIDING SCALE)  (Patient taking differently: Inject 6-10 Units into the skin 3 (three) times daily. )  . insulin glargine (LANTUS SOLOSTAR) 100 UNIT/ML Solostar Pen Inject 40 Units into the skin daily at 10 pm.  . losartan (COZAAR) 25 MG tablet Take 25 mg by mouth daily.  . metoprolol tartrate (LOPRESSOR) 25 MG tablet Take 25 mg by mouth 2 (two) times daily.  . pantoprazole (PROTONIX) 40 MG tablet Take 1 tablet (40 mg total) by mouth daily.  . pramipexole (MIRAPEX) 0.125 MG tablet Take 1 tablet (0.125 mg total) by mouth every evening.  . rosuvastatin (CRESTOR) 40 MG tablet Take 40 mg by mouth daily.  . tadalafil (CIALIS) 10 MG tablet Take 10 mg by mouth daily as needed.  . [DISCONTINUED] amitriptyline (ELAVIL) 100 MG tablet Take 1 tablet (100 mg total) by mouth at bedtime.  . famotidine (PEPCID) 20 MG tablet Take 1 tablet (20 mg total) by mouth at bedtime.  . [DISCONTINUED] guaiFENesin-codeine 100-10 MG/5ML syrup Take 5 mLs by mouth every 12 (twelve) hours as needed for cough. To take when Tussionex prescription runs out.  Do not take both at the same time.   No facility-administered encounter medications on file as of 07/02/2020.   Patient Active Problem List   Diagnosis Date Noted  . Blindness of left eye 07/02/2020  . Depression 07/02/2020  . Respiratory failure (HCC)  06/13/2020  . Upper respiratory tract infection 06/03/2020  . 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 Acute blood loss anemia 06/07/2019  . Acute postoperative pain 06/07/2019  . AKI (acute kidney injury) (HCC) 06/07/2019  . Hyponatremia 06/07/2019  . Ileus (HCC) 06/07/2019  . Physical deconditioning 06/07/2019  . PONV (postoperative nausea and vomiting) 06/07/2019  . 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 Coronary atherosclerosis due to lipid rich plaque 05/23/2019  . Anxiety 04/14/2019  . Insomnia 05/28/2016  . Adhesive capsulitis 12/17/2015  . Hypercholesterolemia 10/22/2015  . Diabetic,  retinopathy, proliferative (HCC) 05/28/2015  . Diabetes mellitus type 1 with peripheral artery disease (HCC) 05/28/2015  . Hypertension   . Type I diabetes mellitus with manifestations, uncontrolled (HCC)   . GERD (gastroesophageal reflux disease)   . Nuclear sclerotic cataract 07/05/2013  . Type 1 diabetes mellitus (HCC) 05/29/2013   Past Medical History:  Diagnosis Date  . Adhesive capsulitis    permanent disability 07/2010, left shoulder  . Anginal pain (HCC)   . Blindness of left eye   . Diabetes mellitus without complication (HCC)   . Diabetic, retinopathy, proliferative (HCC)   . GERD (gastroesophageal reflux disease)   . Hyperlipidemia   . Hypertension    Relevant past medical, surgical, family and social history reviewed and updated as indicated. Interim medical history since our last visit reviewed. Allergies and medications reviewed and updated.  Review of Systems  Constitutional: Positive for appetite change and fatigue. Negative for activity change, chills, diaphoresis and fever.  Respiratory: Negative.  Negative for shortness of breath.   Gastrointestinal: Positive for abdominal distention, abdominal pain, nausea and vomiting. Negative for anal bleeding, blood in stool, constipation and diarrhea.  Genitourinary: Negative.  Negative for dysuria.  Skin: Negative.   Neurological: Negative.   Psychiatric/Behavioral: Negative.     Per HPI unless specifically indicated above     Objective:    BP 137/77   Pulse 82   Temp 98.2 F (36.8 C)   Ht 5\' 7"  (1.702 m)   Wt 194 lb 9.6 oz (88.3 kg)   SpO2 97%   BMI 30.48 kg/m   Wt Readings from Last 3 Encounters:  07/02/20 194 lb 9.6 oz (88.3 kg)  06/20/20 206 lb 2.1 oz (93.5 kg)  06/03/20 208 lb (94.3 kg)    Physical Exam Constitutional:      General: He is not in acute distress.    Appearance: Normal appearance. He is obese. He is not toxic-appearing.  HENT:     Head: Normocephalic and atraumatic.    Cardiovascular:     Comments: Unable to assess heart sounds via virtual visit. Pulmonary:     Effort: Pulmonary effort is normal. No respiratory distress.     Comments: Unable to assess lung sounds via virtual visit.  Patient talking in complete sentences.  No accessory muscle use. Abdominal:     General: There is distension.     Comments: Unable to assess bowel sounds via virtual visit  Skin:    Capillary Refill: Capillary refill takes less than 2 seconds.     Coloration: Skin is not jaundiced or pale.     Findings: No erythema.  Neurological:     Mental Status: He is alert and oriented to person, place, and time.     Motor: No weakness.     Gait: Gait normal.  Psychiatric:        Mood and Affect: Mood normal.        Behavior:  Behavior normal.        Thought Content: Thought content normal.        Judgment: Judgment normal.        Assessment & Plan:   Problem List Items Addressed This Visit      Digestive   GERD (gastroesophageal reflux disease) - Primary    Chronic, ongoing.  Instructed not to take both pantoprazole and omeprazole.  Will start famotidine 20 mg at night and place referral to general surgery for ongoing abdominal pain and regurgitation of food.  Had seen a general surgeon at [facility] about 1 year ago-will place referral back to the same provider.  Follow-up in 4 weeks if no improvement and has not gotten with general surgery yet.      Relevant Medications   famotidine (PEPCID) 20 MG tablet     Other   Insomnia    Chronic, stable and amitriptyline 100 mg nightly.  We will continue this medication-refill sent into pharmacy.  Follow-up in 6 months.       Other Visit Diagnoses    Pain of upper abdomen       Relevant Orders   Ambulatory referral to General Surgery    Likely due to ongoing acid reflux.  Will start famotidine 20 mg at night and follow-up in 4 weeks if not improved.  So place referral to general surgeon for ongoing management of abdominal  mass/hernia.  Follow up plan: Return in about 2 months (around 09/01/2020) for Chronic disease follow up with PCP.  Due to the catastrophic nature of the COVID-19 pandemic, this visit was completed via audio and visual contact via [employer] due to the restrictions of the COVID-19 pandemic. All issues as above were discussed and addressed. Physical exam was done as above through visual confirmation on [employer]. If it was felt that the patient should be evaluated in the office, they were directed there. The patient verbally consented to this visit."} . Location of the patient: home . Location of the provider: work . Those involved with this call:  . Provider: Mardene Celeste, DNP . CMA: Tristan Schroeder, CMA . Front Desk/Registration: Harriet Pho  . Time spent on call: 30 minutes with patient face to face via video conference. More than 50% of this time was spent in counseling and coordination of care. 30 minutes total spent in review of patient's record and preparation of their chart.  I verified patient identity using two factors (patient name and date of birth). Patient consents verbally to being seen via telemedicine visit today.   30+ minutes spent with the patient today.

## 2020-07-10 ENCOUNTER — Other Ambulatory Visit: Payer: Self-pay

## 2020-07-10 ENCOUNTER — Encounter: Payer: Self-pay | Admitting: Surgery

## 2020-07-10 ENCOUNTER — Ambulatory Visit: Payer: Medicare Other | Admitting: Surgery

## 2020-07-10 VITALS — BP 136/80 | HR 87 | Temp 98.5°F | Ht 67.0 in | Wt 202.2 lb

## 2020-07-10 DIAGNOSIS — R112 Nausea with vomiting, unspecified: Secondary | ICD-10-CM

## 2020-07-10 DIAGNOSIS — K449 Diaphragmatic hernia without obstruction or gangrene: Secondary | ICD-10-CM | POA: Diagnosis not present

## 2020-07-10 NOTE — Patient Instructions (Addendum)
Patient scheduled for CT scan on December 15 th, 2021 at [facility] arrive at 8:30a. Patient will pick up prep kit and patient is NOT to have anything to eat or drink 4 hours prior to scan.  Patient will have Barium Swallow at the [facility] at 11:00a. Patient will also have lab work done (CBC and CMP). Referral was sent to [facility]. Their facility will contact for a appointment.   Hiatal Hernia  A hiatal hernia occurs when part of the stomach slides above the muscle that separates the abdomen from the chest (diaphragm). A person can be born with a hiatal hernia (congenital), or it may develop over time. In almost all cases of hiatal hernia, only the top part of the stomach pushes through the diaphragm. Many people have a hiatal hernia with no symptoms. The larger the hernia, the more likely it is that you will have symptoms. In some cases, a hiatal hernia allows stomach acid to flow back into the tube that carries food from your mouth to your stomach (esophagus). This may cause heartburn symptoms. Severe heartburn symptoms may mean that you have developed a condition called gastroesophageal reflux disease (GERD). What are the causes? This condition is caused by a weakness in the opening (hiatus) where the esophagus passes through the diaphragm to attach to the upper part of the stomach. A person may be born with a weakness in the hiatus, or a weakness can develop over time. What increases the risk? This condition is more likely to develop in:  Older people. Age is a major risk factor for a hiatal hernia, especially if you are over the age of 80.  Pregnant women.  People who are overweight.  People who have frequent constipation. What are the signs or symptoms? Symptoms of this condition usually develop in the form of GERD symptoms. Symptoms include:  Heartburn.  Belching.  Indigestion.  Trouble swallowing.  Coughing or wheezing.  Sore  throat.  Hoarseness.  Chest pain.  Nausea and vomiting. How is this diagnosed? This condition may be diagnosed during testing for GERD. Tests that may be done include:  X-rays of your stomach or chest.  An upper gastrointestinal (GI) series. This is an X-ray exam of your GI tract that is taken after you swallow a chalky liquid that shows up clearly on the X-ray.  Endoscopy. This is a procedure to look into your stomach using a thin, flexible tube that has a tiny camera and light on the end of it. How is this treated? This condition may be treated by:  Dietary and lifestyle changes to help reduce GERD symptoms.  Medicines. These may include: ? Over-the-counter antacids. ? Medicines that make your stomach empty more quickly. ? Medicines that block the production of stomach acid (H2 blockers). ? Stronger medicines to reduce stomach acid (proton pump inhibitors).  Surgery to repair the hernia, if other treatments are not helping. If you have no symptoms, you may not need treatment. Follow these instructions at home: Lifestyle and activity  Do not use any products that contain nicotine or tobacco, such as cigarettes and e-cigarettes. If you need help quitting, ask your health care provider.  Try to achieve and maintain a healthy body weight.  Avoid putting pressure on your abdomen. Anything that puts pressure on your abdomen increases the amount of acid that may be pushed up into your esophagus. ? Avoid bending over, especially after eating. ? Raise the head of your bed by putting blocks  under the legs. This keeps your head and esophagus higher than your stomach. ? Do not wear tight clothing around your chest or stomach. ? Try not to strain when having a bowel movement, when urinating, or when lifting heavy objects. Eating and drinking  Avoid foods that can worsen GERD symptoms. These may include: ? Fatty foods, like fried foods. ? Citrus fruits, like oranges or lemon. ? Other  foods and drinks that contain acid, like orange juice or tomatoes. ? Spicy food. ? Chocolate.  Eat frequent small meals instead of three large meals a day. This helps prevent your stomach from getting too full. ? Eat slowly. ? Do not lie down right after eating. ? Do not eat 1-2 hours before bed.  Do not drink beverages with caffeine. These include cola, coffee, cocoa, and tea.  Do not drink alcohol. General instructions  Take over-the-counter and prescription medicines only as told by your health care provider.  Keep all follow-up visits as told by your health care provider. This is important. Contact a health care provider if:  Your symptoms are not controlled with medicines or lifestyle changes.  You are having trouble swallowing.  You have coughing or wheezing that will not go away. Get help right away if:  Your pain is getting worse.  Your pain spreads to your arms, neck, jaw, teeth, or back.  You have shortness of breath.  You sweat for no reason.  You feel sick to your stomach (nauseous) or you vomit.  You vomit blood.  You have bright red blood in your stools.  You have black, tarry stools. This information is not intended to replace advice given to you by your health care provider. Make sure you discuss any questions you have with your health care provider. Document Revised: 07/09/2017 Document Reviewed: 03/01/2017 Elsevier Patient Education  [employer].

## 2020-07-11 NOTE — Progress Notes (Signed)
Patient ID: Mike Kline, male   DOB: [birth year], 49 y.o.   MRN: [id number]  HPI Mike Kline is a 49 y.o. male seen in consultation at the request of Mrs. Bradly Bienenstock NP for recalcitrant reflux.  The patient reports that he has significant heartburn especially when he lays down.  He also has some chronic cough.  Very mild dysphagia.  He also experiences significant nausea.  He has been started on some famotidine with very minimal improvement.  Of note the patient had recent history of Covid infection and was hospitalized.  He also had a significant history of coronary artery disease requiring CABG last year.  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  He continues to have this left lower abdominal wall mass.  I have personally reviewed the CT scan showing no other major abnormalities. He has had have an upper endoscopy nor colonoscopy yet.  Able to perform more than 4 METS of activity without any shortness of breath or chest pain.  Recent labs show anemia of 9.9 and mild increase in the white count.  Normal BMP.  HPI  Past Medical History:  Diagnosis Date  . Adhesive capsulitis    permanent disability 07/2010, left shoulder  . Anginal pain (HCC)   . Blindness of left eye   . Diabetes mellitus without complication (HCC)   . Diabetic, retinopathy, proliferative (HCC)   . GERD (gastroesophageal reflux disease)   . Hyperlipidemia   . Hypertension     Past Surgical History:  Procedure Laterality Date  . APPENDECTOMY    . CARPAL TUNNEL RELEASE Bilateral   . cataract surgery Bilateral 2014  . EYE SURGERY Left   . LEFT HEART CATH AND CORONARY ANGIOGRAPHY Left 05/22/2019   Procedure: LEFT HEART CATH AND CORONARY ANGIOGRAPHY;  Surgeon: Alwyn Pea, MD;  Location: [facility] INVASIVE CV LAB;  Service: Cardiovascular;  Laterality: Left;  . SHOULDER SURGERY Left 2009    Family History  Problem  Relation Age of Onset  . Bone cancer Mother     Social History Social History   Tobacco Use  . Smoking status: Never Smoker  . Smokeless tobacco: Never Used  Vaping Use  . Vaping Use: Never used  Substance Use Topics  . Alcohol use: Not Currently    Comment: socially  . Drug use: No    No Known Allergies  Current Outpatient Medications  Medication Sig Dispense Refill  . amitriptyline (ELAVIL) 100 MG tablet Take 1 tablet (100 mg total) by mouth at bedtime. 90 tablet 1  . famotidine (PEPCID) 20 MG tablet Take 1 tablet (20 mg total) by mouth at bedtime. 30 tablet 1  . gabapentin (NEURONTIN) 300 MG capsule Take 1 capsule (300 mg total) by mouth 2 (two) times daily.    Marland Kitchen HUMALOG KWIKPEN 100 UNIT/ML KiwkPen USE THREE TIMES A DAY AS DIRECTED.(UP TO 50 UNITS A DAY OR AS DIRECTED BY SLIDING SCALE) (Patient taking differently: Inject 6-10 Units into the skin 3 (three) times daily. ) 15 mL 6  . insulin glargine (LANTUS SOLOSTAR) 100 UNIT/ML Solostar Pen Inject 40 Units into the skin daily at 10 pm.    . losartan (COZAAR) 25 MG tablet Take 25 mg by mouth daily.    . metoprolol tartrate (LOPRESSOR) 25 MG tablet Take 25 mg by mouth 2 (two) times daily.    . pantoprazole (  PROTONIX) 40 MG tablet Take 1 tablet (40 mg total) by mouth daily. 90 tablet 0  . pramipexole (MIRAPEX) 0.125 MG tablet Take 1 tablet (0.125 mg total) by mouth every evening. 90 tablet 0  . rosuvastatin (CRESTOR) 40 MG tablet Take 40 mg by mouth daily.    . tadalafil (CIALIS) 10 MG tablet Take 10 mg by mouth daily as needed.     No current facility-administered medications for this visit.     Review of Systems Full ROS  was asked and was negative except for the information on the HPI  Physical Exam Blood pressure 136/80, pulse 87, temperature 98.5 F (36.9 C), temperature source Oral, height 5\' 7"  (1.702 m), weight 202 lb 3.2 oz (91.7 kg), SpO2 94 %. CONSTITUTIONAL: nad. EYES: Pupils are equal, round,, Sclera are  non-icteric. EARS, NOSE, MOUTH AND THROAT: He is wearing a mask. Hearing is intact to voice. LYMPH NODES:  Lymph nodes in the neck are normal. RESPIRATORY:  Lungs are clear. There is normal respiratory effort, with equal breath sounds bilaterally, and without pathologic use of accessory muscles. CARDIOVASCULAR: Heart is regular without murmurs, gallops, or rubs.  Midline sternotomy with no evidence of sternal instability GI: The abdomen is  soft, nontender, and nondistended. There are no palpable masses.  There is evidence of fullness in the left lower quadrant within the abdominal wall consistent with lipoma.  There is no hepatosplenomegaly. There are normal bowel sounds in all quadrants. GU: Rectal deferred.   MUSCULOSKELETAL: Normal muscle strength and tone. No cyanosis or edema.   SKIN: Turgor is good and there are no pathologic skin lesions or ulcers. NEUROLOGIC: Motor and sensation is grossly normal. Cranial nerves are grossly intact. PSYCH:  Oriented to person, place and time. Affect is normal.  Data Reviewed  I have personally reviewed the patient's imaging, laboratory findings and medical records.    Assessment/Plan Calcitrene reflux with questionable hiatal hernia.  Start appropriate work-up with a CT scan of the abdomen and pelvis, will refer to GI for upper and lower scopes.  We will also perform a barium swallow to evaluate the esophageal and GE junction anatomy.  We will have him seen after he completes appropriate work-up he probably needs cardiology preoperative optimization as well.  Extensive counseling provided in the interim he is to continue current antireflux medication.  No need for emergent surgical intervention at this time.  A copy of this report was sent to the referring provider We will also repeat labs with CBC and CMP to follow-up anemia and renal function.    , MD FACS General Surgeon 07/11/2020, 3:16 PM

## 2020-07-23 ENCOUNTER — Encounter: Payer: Self-pay | Admitting: General Practice

## 2020-07-23 ENCOUNTER — Ambulatory Visit: Payer: Medicare Other

## 2020-07-24 ENCOUNTER — Ambulatory Visit: Admission: RE | Admit: 2020-07-24 | Payer: Medicare Other | Source: Ambulatory Visit

## 2020-07-29 ENCOUNTER — Ambulatory Visit (INDEPENDENT_AMBULATORY_CARE_PROVIDER_SITE_OTHER): Payer: Medicare Other

## 2020-07-29 VITALS — BP 135/98 | HR 107 | Ht 67.0 in | Wt 194.0 lb

## 2020-07-29 DIAGNOSIS — Z0000 Encounter for general adult medical examination without abnormal findings: Secondary | ICD-10-CM

## 2020-07-29 NOTE — Progress Notes (Addendum)
I connected with  Mellody Drown today via telehealth video enabled device and verified that I am speaking with the correct person using two identifiers.   Location: Patient: home Provider: home  Persons participating in virtual visit: Darvell Monteforte, Elisha Ponder LPN  I discussed the limitations, risks, security and privacy concerns of performing an evaluation and management service by video and the availability of in person appointments. The patient expressed understanding and agreed to proceed.   Some vital signs may be absent or patient reported.       Subjective:   Mike Kline is a 49 y.o. male who presents for an Initial Medicare Annual Wellness Visit.  Review of Systems     Cardiac Risk Factors include: diabetes mellitus;hypertension;male gender;obesity (BMI >30kg/m2);sedentary lifestyle     Objective:    Today's Vitals   07/29/20 0858  BP: (!) 135/98  Pulse: (!) 107  Weight: 194 lb (88 kg)  Height: 5\' 7"  (1.702 m)   Body mass index is 30.38 kg/m.  Advanced Directives 07/29/2020 06/13/2020 05/18/2020 08/11/2018 06/23/2016  Does Patient Have a Medical Advance Directive? No No No No No  Would patient like information on creating a medical advance directive? - Yes (Inpatient - patient requests chaplain consult to create a medical advance directive) No - Patient declined - No - patient declined information    Current Medications (verified) Outpatient Encounter Medications as of 07/29/2020  Medication Sig  . amitriptyline (ELAVIL) 100 MG tablet Take 1 tablet (100 mg total) by mouth at bedtime.  . famotidine (PEPCID) 20 MG tablet Take 1 tablet (20 mg total) by mouth at bedtime.  . gabapentin (NEURONTIN) 300 MG capsule Take 1 capsule (300 mg total) by mouth 2 (two) times daily.  07/31/2020 HUMALOG KWIKPEN 100 UNIT/ML KiwkPen USE THREE TIMES A DAY AS DIRECTED.(UP TO 50 UNITS A DAY OR AS DIRECTED BY SLIDING SCALE) (Patient taking differently: Inject 6-10 Units into the skin 3 (three)  times daily.)  . insulin glargine (LANTUS SOLOSTAR) 100 UNIT/ML Solostar Pen Inject 40 Units into the skin daily at 10 pm.  . losartan (COZAAR) 25 MG tablet Take 25 mg by mouth daily.  . metoprolol tartrate (LOPRESSOR) 25 MG tablet Take 25 mg by mouth 2 (two) times daily.  . pantoprazole (PROTONIX) 40 MG tablet Take 1 tablet (40 mg total) by mouth daily.  . pramipexole (MIRAPEX) 0.125 MG tablet Take 1 tablet (0.125 mg total) by mouth every evening.  . rosuvastatin (CRESTOR) 40 MG tablet Take 40 mg by mouth daily.  . tadalafil (CIALIS) 10 MG tablet Take 10 mg by mouth daily as needed.   No facility-administered encounter medications on file as of 07/29/2020.    Allergies (verified) Patient has no known allergies.   History: Past Medical History:  Diagnosis Date  . Adhesive capsulitis    permanent disability 07/2010, left shoulder  . Anginal pain (HCC)   . Blindness of left eye   . Diabetes mellitus without complication (HCC)   . Diabetic, retinopathy, proliferative (HCC)   . GERD (gastroesophageal reflux disease)   . Hyperlipidemia   . Hypertension    Past Surgical History:  Procedure Laterality Date  . APPENDECTOMY    . CARPAL TUNNEL RELEASE Bilateral   . cataract surgery Bilateral 2014  . EYE SURGERY Left   . LEFT HEART CATH AND CORONARY ANGIOGRAPHY Left 05/22/2019   Procedure: LEFT HEART CATH AND CORONARY ANGIOGRAPHY;  Surgeon: 07/22/2019, MD;  Location: [facility] INVASIVE CV LAB;  Service:  Cardiovascular;  Laterality: Left;  . SHOULDER SURGERY Left 2009   Family History  Problem Relation Age of Onset  . Bone cancer Mother    Social History   Socioeconomic History  . Marital status: Divorced    Spouse name: Not on file  . Number of children: Not on file  . Years of education: Not on file  . Highest education level: Not on file  Occupational History  . Occupation: disability  Tobacco Use  . Smoking status: Never Smoker  . Smokeless tobacco: Never Used   Vaping Use  . Vaping Use: Never used  Substance and Sexual Activity  . Alcohol use: Not Currently    Comment: socially  . Drug use: No  . Sexual activity: Not on file  Other Topics Concern  . Not on file  Social History Narrative  . Not on file   Social Determinants of Health   Financial Resource Strain: Low Risk   . Difficulty of Paying Living Expenses: Not hard at all  Food Insecurity: No Food Insecurity  . Worried About Programme researcher, broadcasting/film/videounning Out of Food in the Last Year: Never true  . Ran Out of Food in the Last Year: Never true  Transportation Needs: No Transportation Needs  . Lack of Transportation (Medical): No  . Lack of Transportation (Non-Medical): No  Physical Activity: Inactive  . Days of Exercise per Week: 0 days  . Minutes of Exercise per Session: 0 min  Stress: No Stress Concern Present  . Feeling of Stress : Not at all  Social Connections: Not on file    Tobacco Counseling Counseling given: Not Answered   Clinical Intake:  Pre-visit preparation completed: Yes  Pain : No/denies pain     Nutritional Status: BMI > 30  Obese Nutritional Risks: Nausea/ vomitting/ diarrhea (very seldom vomiting) Diabetes: Yes  How often do you need to have someone help you when you read instructions, pamphlets, or other written materials from your doctor or pharmacy?: 1 - Never What is the last grade level you completed in school?: 12th grade  Diabetic? Yes Nutrition Risk Assessment:  Has the patient had any N/V/D within the last 2 months?  No  Does the patient have any non-healing wounds?  No  Has the patient had any unintentional weight loss or weight gain?  No   Diabetes:  Is the patient diabetic?  Yes  If diabetic, was a CBG obtained today?  Yes  Did the patient bring in their glucometer from home?  Yes  How often do you monitor your CBG's? A lot.   Financial Strains and Diabetes Management:  Are you having any financial strains with the device, your supplies or your  medication? No .  Does the patient want to be seen by Chronic Care Management for management of their diabetes?  No  Would the patient like to be referred to a Nutritionist or for Diabetic Management?  No   Diabetic Exams:  Diabetic Eye Exam: Overdue for diabetic eye exam. Pt has been advised about the importance in completing this exam. Patient advised to call and schedule an eye exam. Diabetic Foot Exam: Overdue, Pt has been advised about the importance in completing this exam. Pt is scheduled for diabetic foot exam on next appointment.   Interpreter Needed?: No  Information entered by :: NAllen LPN   Activities of Daily Living In your present state of health, do you have any difficulty performing the following activities: 07/29/2020 06/13/2020  Hearing? N N  Vision? N  Y  Difficulty concentrating or making decisions? N N  Walking or climbing stairs? N N  Dressing or bathing? N N  Doing errands, shopping? N N  Preparing Food and eating ? N -  Using the Toilet? N -  In the past six months, have you accidently leaked urine? N -  Do you have problems with loss of bowel control? N -  Managing your Medications? N -  Managing your Finances? N -  Housekeeping or managing your Housekeeping? N -  Some recent data might be hidden    Patient Care Team: Steele Sizer, MD as PCP - General (Family Medicine)  Indicate any recent Medical Services you may have received from other than [facility] providers in the past year (date may be approximate).     Assessment:   This is a routine wellness examination for Mike Kline.  Hearing/Vision screen  Hearing Screening   125Hz  250Hz  500Hz  1000Hz  2000Hz  3000Hz  4000Hz  6000Hz  8000Hz   Right ear:           Left ear:           Vision Screening Comments: Regular eye exams, Dr.  Dietary issues and exercise activities discussed: Current Exercise Habits: The patient does not participate in regular exercise at present  Goals    . Patient Stated      07/29/2020, wants to start exercising again      Depression Screen PHQ 2/9 Scores 07/29/2020 07/02/2020 12/15/2016 05/28/2016  PHQ - 2 Score 0 0 0 2  PHQ- 9 Score - - - 8    Fall Risk Fall Risk  07/29/2020 07/10/2020 05/08/2019 12/15/2016  Falls in the past year? 0 0 0 No  Number falls in past yr: - 0 - -  Injury with Fall? - 0 - -  Risk for fall due to : Medication side effect - - -  Follow up Falls evaluation completed;Education provided;Falls prevention discussed - - -    FALL RISK PREVENTION PERTAINING TO THE HOME:  Any stairs in or around the home? Yes  If so, are there any without handrails? No  Home free of loose throw rugs in walkways, pet beds, electrical cords, etc? Yes  Adequate lighting in your home to reduce risk of falls? Yes   ASSISTIVE DEVICES UTILIZED TO PREVENT FALLS:  Life alert? No  Use of a cane, walker or w/c? No  Grab bars in the bathroom? Yes  Shower chair or bench in shower? No  Elevated toilet seat or a handicapped toilet? No   TIMED UP AND GO:  Was the test performed? No .     Cognitive Function:     6CIT Screen 07/29/2020  What Year? 0 points  What month? 0 points  What time? 0 points  Count back from 20 0 points  Months in reverse 2 points  Repeat phrase 0 points  Total Score 2    Immunizations Immunization History  Administered Date(s) Administered  . Influenza,inj,Quad PF,6+ Mos 05/28/2015, 04/14/2019  . Influenza-Unspecified 05/15/2014, 06/14/2020  . Pneumococcal-Unspecified 03/05/2010  . Td 09/20/2008    TDAP status: Due, Education has been provided regarding the importance of this vaccine. Advised may receive this vaccine at local pharmacy or Health Dept. Aware to provide a copy of the vaccination record if obtained from local pharmacy or Health Dept. Verbalized acceptance and understanding.  Flu Vaccine status: Up to date  Pneumococcal vaccine status: Up to date  Covid-19 vaccine status: Information provided on how to  obtain vaccines.  Qualifies for Shingles Vaccine? Yes   Zostavax completed No   Shingrix Completed?: No.    Education has been provided regarding the importance of this vaccine. Patient has been advised to call insurance company to determine out of pocket expense if they have not yet received this vaccine. Advised may also receive vaccine at local pharmacy or Health Dept. Verbalized acceptance and understanding.  Screening Tests Health Maintenance  Topic Date Due  . Hepatitis C Screening  Never done  . PNEUMOCOCCAL POLYSACCHARIDE VACCINE AGE 48-64 HIGH RISK  Never done  . OPHTHALMOLOGY EXAM  Never done  . COVID-19 Vaccine (1) Never done  . FOOT EXAM  05/27/2016  . TETANUS/TDAP  09/20/2018  . COLONOSCOPY  07/29/2021 (Originally 02/12/2019)  . HEMOGLOBIN A1C  12/13/2020  . INFLUENZA VACCINE  Completed  . HIV Screening  Completed    Health Maintenance  Health Maintenance Due  Topic Date Due  . Hepatitis C Screening  Never done  . PNEUMOCOCCAL POLYSACCHARIDE VACCINE AGE 48-64 HIGH RISK  Never done  . OPHTHALMOLOGY EXAM  Never done  . COVID-19 Vaccine (1) Never done  . FOOT EXAM  05/27/2016  . TETANUS/TDAP  09/20/2018    Colorectal cancer screening: declined at this time  Lung Cancer Screening: (Low Dose CT Chest recommended if Age 57-80 years, 30 pack-year currently smoking OR have quit w/in 15years.) does not qualify.   Lung Cancer Screening Referral: no  Additional Screening:  Hepatitis C Screening: does qualify; Completed due  Vision Screening: Recommended annual ophthalmology exams for early detection of glaucoma and other disorders of the eye. Is the patient up to date with their annual eye exam?  No  Who is the provider or what is the name of the office in which the patient attends annual eye exams? Dr. Clydene Pugh If pt is not established with a provider, would they like to be referred to a provider to establish care? No .   Dental Screening: Recommended annual dental  exams for proper oral hygiene  Community Resource Referral / Chronic Care Management: CRR required this visit?  No   CCM required this visit?  No      Plan:     I have personally reviewed and noted the following in the patient's chart:   . Medical and social history . Use of alcohol, tobacco or illicit drugs  . Current medications and supplements . Functional ability and status . Nutritional status . Physical activity . Advanced directives . List of other physicians . Hospitalizations, surgeries, and ER visits in previous 12 months . Vitals . Screenings to include cognitive, depression, and falls . Referrals and appointments  In addition, I have reviewed and discussed with patient certain preventive protocols, quality metrics, and best practice recommendations. A written personalized care plan for preventive services as well as general preventive health recommendations were provided to patient.     Barb Merino, LPN   08/[id number]/[id number]   Nurse Notes:

## 2020-07-29 NOTE — Patient Instructions (Signed)
[name] , Thank you for taking time to come for your Medicare Wellness Visit. I appreciate your ongoing commitment to your health goals. Please review the following plan we discussed and let me know if I can assist you in the future.   Screening recommendations/referrals: Colonoscopy: wants to wait until hernia is fixed Recommended yearly ophthalmology/optometry visit for glaucoma screening and checkup Recommended yearly dental visit for hygiene and checkup  Vaccinations: Influenza vaccine: completed 06/14/2020 Pneumococcal vaccine: completed 03/05/2010 Tdap vaccine: due Shingles vaccine: discussed   Covid-19: recovering from covid must wait  Advanced directives: Advance directive discussed with you today. Even though you declined this today please call our office should you change your mind and we can give you the proper paperwork for you to fill out.  Conditions/risks identified: none  Next appointment: Follow up in one year for your annual wellness visit   Preventive Care 40-64 Years, Male Preventive care refers to lifestyle choices and visits with your health care provider that can promote health and wellness. What does preventive care include?  A yearly physical exam. This is also called an annual well check.  Dental exams once or twice a year.  Routine eye exams. Ask your health care provider how often you should have your eyes checked.  Personal lifestyle choices, including:  Daily care of your teeth and gums.  Regular physical activity.  Eating a healthy diet.  Avoiding tobacco and drug use.  Limiting alcohol use.  Practicing safe sex.  Taking low-dose aspirin every day starting at age 2. What happens during an annual well check? The services and screenings done by your health care provider during your annual well check will depend on your age, overall health, lifestyle risk factors, and family history of disease. Counseling  Your health care provider may ask  you questions about your:  Alcohol use.  Tobacco use.  Drug use.  Emotional well-being.  Home and relationship well-being.  Sexual activity.  Eating habits.  Work and work Astronomer. Screening  You may have the following tests or measurements:  Height, weight, and BMI.  Blood pressure.  Lipid and cholesterol levels. These may be checked every 5 years, or more frequently if you are over 61 years old.  Skin check.  Lung cancer screening. You may have this screening every year starting at age 76 if you have a 30-pack-year history of smoking and currently smoke or have quit within the past 15 years.  Fecal occult blood test (FOBT) of the stool. You may have this test every year starting at age 82.  Flexible sigmoidoscopy or colonoscopy. You may have a sigmoidoscopy every 5 years or a colonoscopy every 10 years starting at age 8.  Prostate cancer screening. Recommendations will vary depending on your family history and other risks.  Hepatitis C blood test.  Hepatitis B blood test.  Sexually transmitted disease (STD) testing.  Diabetes screening. This is done by checking your blood sugar (glucose) after you have not eaten for a while (fasting). You may have this done every 1-3 years. Discuss your test results, treatment options, and if necessary, the need for more tests with your health care provider. Vaccines  Your health care provider may recommend certain vaccines, such as:  Influenza vaccine. This is recommended every year.  Tetanus, diphtheria, and acellular pertussis (Tdap, Td) vaccine. You may need a Td booster every 10 years.  Zoster vaccine. You may need this after age 86.  Pneumococcal 13-valent conjugate (PCV13) vaccine. You may need this  if you have certain conditions and have not been vaccinated.  Pneumococcal polysaccharide (PPSV23) vaccine. You may need one or two doses if you smoke cigarettes or if you have certain conditions. Talk to your health  care provider about which screenings and vaccines you need and how often you need them. This information is not intended to replace advice given to you by your health care provider. Make sure you discuss any questions you have with your health care provider. Document Released: 08/23/2015 Document Revised: 04/15/2016 Document Reviewed: 05/28/2015 Elsevier Interactive Patient Education  2017 [employer].  Fall Prevention in the Home Falls can cause injuries. They can happen to people of all ages. There are many things you can do to make your home safe and to help prevent falls. What can I do on the outside of my home?  Regularly fix the edges of walkways and driveways and fix any cracks.  Remove anything that might make you trip as you walk through a door, such as a raised step or threshold.  Trim any bushes or trees on the path to your home.  Use bright outdoor lighting.  Clear any walking paths of anything that might make someone trip, such as rocks or tools.  Regularly check to see if handrails are loose or broken. Make sure that both sides of any steps have handrails.  Any raised decks and porches should have guardrails on the edges.  Have any leaves, snow, or ice cleared regularly.  Use sand or salt on walking paths during winter.  Clean up any spills in your garage right away. This includes oil or grease spills. What can I do in the bathroom?  Use night lights.  Install grab bars by the toilet and in the tub and shower. Do not use towel bars as grab bars.  Use non-skid mats or decals in the tub or shower.  If you need to sit down in the shower, use a plastic, non-slip stool.  Keep the floor dry. Clean up any water that spills on the floor as soon as it happens.  Remove soap buildup in the tub or shower regularly.  Attach bath mats securely with double-sided non-slip rug tape.  Do not have throw rugs and other things on the floor that can make you trip. What can I do  in the bedroom?  Use night lights.  Make sure that you have a light by your bed that is easy to reach.  Do not use any sheets or blankets that are too big for your bed. They should not hang down onto the floor.  Have a firm chair that has side arms. You can use this for support while you get dressed.  Do not have throw rugs and other things on the floor that can make you trip. What can I do in the kitchen?  Clean up any spills right away.  Avoid walking on wet floors.  Keep items that you use a lot in easy-to-reach places.  If you need to reach something above you, use a strong step stool that has a grab bar.  Keep electrical cords out of the way.  Do not use floor polish or wax that makes floors slippery. If you must use wax, use non-skid floor wax.  Do not have throw rugs and other things on the floor that can make you trip. What can I do with my stairs?  Do not leave any items on the stairs.  Make sure that there are handrails on  both sides of the stairs and use them. Fix handrails that are broken or loose. Make sure that handrails are as long as the stairways.  Check any carpeting to make sure that it is firmly attached to the stairs. Fix any carpet that is loose or worn.  Avoid having throw rugs at the top or bottom of the stairs. If you do have throw rugs, attach them to the floor with carpet tape.  Make sure that you have a light switch at the top of the stairs and the bottom of the stairs. If you do not have them, ask someone to add them for you. What else can I do to help prevent falls?  Wear shoes that:  Do not have high heels.  Have rubber bottoms.  Are comfortable and fit you well.  Are closed at the toe. Do not wear sandals.  If you use a stepladder:  Make sure that it is fully opened. Do not climb a closed stepladder.  Make sure that both sides of the stepladder are locked into place.  Ask someone to hold it for you, if possible.  Clearly mark  and make sure that you can see:  Any grab bars or handrails.  First and last steps.  Where the edge of each step is.  Use tools that help you move around (mobility aids) if they are needed. These include:  Canes.  Walkers.  Scooters.  Crutches.  Turn on the lights when you go into a dark area. Replace any light bulbs as soon as they burn out.  Set up your furniture so you have a clear path. Avoid moving your furniture around.  If any of your floors are uneven, fix them.  If there are any pets around you, be aware of where they are.  Review your medicines with your doctor. Some medicines can make you feel dizzy. This can increase your chance of falling. Ask your doctor what other things that you can do to help prevent falls. This information is not intended to replace advice given to you by your health care provider. Make sure you discuss any questions you have with your health care provider. Document Released: 05/23/2009 Document Revised: 01/02/2016 Document Reviewed: 08/31/2014 Elsevier Interactive Patient Education  2017 [employer].

## 2020-08-06 ENCOUNTER — Other Ambulatory Visit: Payer: Self-pay | Admitting: Family Medicine

## 2020-08-06 NOTE — Telephone Encounter (Signed)
   Notes to clinic:  Medication filled by a different provider Review for refill    Requested Prescriptions  Pending Prescriptions Disp Refills   gabapentin (NEURONTIN) 300 MG capsule [Pharmacy Med Name: GABAPENTIN 300 MG CAPSULE] 90 capsule 0    Sig: Take 1 capsule (300 mg total) by mouth 3 (three) times daily.      Neurology: Anticonvulsants - gabapentin Passed - 08/06/2020 10:15 AM      Passed - Valid encounter within last 12 months    Recent Outpatient Visits           1 month ago Gastroesophageal reflux disease, unspecified whether esophagitis present   [facility] Valentino Nose, NP   1 month ago Erroneous encounter - disregard   [facility] Valentino Nose, NP   2 months ago Upper respiratory tract infection, unspecified type   [facility] Valentino Nose, NP   1 year ago Cellulitis of left lower extremity   [facility] Roosvelt Maser [place], [place]   1 year ago Cough   [facility] Roosvelt Maser [place], [place]

## 2020-08-06 NOTE — Telephone Encounter (Signed)
Lvm to make this apt. 

## 2020-08-07 NOTE — Telephone Encounter (Signed)
Lvm to make this apt. 

## 2020-08-08 ENCOUNTER — Other Ambulatory Visit: Payer: Self-pay | Admitting: Family Medicine

## 2020-08-08 NOTE — Telephone Encounter (Signed)
Requested medication (s) are due for refill today:   Yes  Requested medication (s) are on the active medication list:   Yes  Future visit scheduled:   No   Been contacted several times to call in and make an appt for further refills.   No appt made yet.   Last ordered: Returned for provider to decide on a refill or not.   Requested Prescriptions  Pending Prescriptions Disp Refills   gabapentin (NEURONTIN) 300 MG capsule [Pharmacy Med Name: GABAPENTIN 300 MG CAPSULE] 90 capsule 0    Sig: Take 1 capsule (300 mg total) by mouth 3 (three) times daily.      Neurology: Anticonvulsants - gabapentin Passed - 08/08/2020 11:29 AM      Passed - Valid encounter within last 12 months    Recent Outpatient Visits           1 month ago Gastroesophageal reflux disease, unspecified whether esophagitis present   [facility] Valentino Nose, NP   1 month ago Erroneous encounter - disregard   [facility] Valentino Nose, NP   2 months ago Upper respiratory tract infection, unspecified type   [facility] Valentino Nose, NP   1 year ago Cellulitis of left lower extremity   [facility] Roosvelt Maser [place], [place]   1 year ago Cough   [facility] Roosvelt Maser [place], [place]

## 2020-08-08 NOTE — Telephone Encounter (Signed)
Lvm to make this apt. 

## 2020-08-16 ENCOUNTER — Other Ambulatory Visit: Payer: Self-pay

## 2020-08-16 MED ORDER — PRAMIPEXOLE DIHYDROCHLORIDE 0.125 MG PO TABS
0.1250 mg | ORAL_TABLET | Freq: Every evening | ORAL | 0 refills | Status: DC
Start: 2020-08-16 — End: 2020-10-17

## 2020-08-16 NOTE — Telephone Encounter (Signed)
Previous RL patient. Last seen 07/02/20

## 2020-08-21 ENCOUNTER — Other Ambulatory Visit: Payer: Self-pay | Admitting: Family Medicine

## 2020-08-21 NOTE — Telephone Encounter (Signed)
   Notes to clinic:  medication last filled by Roosvelt Maser Review for refill    Requested Prescriptions  Pending Prescriptions Disp Refills   pantoprazole (PROTONIX) 40 MG tablet [Pharmacy Med Name: PANTOPRAZOLE SOD DR 40 MG TAB] 90 tablet 0    Sig: Take 1 tablet (40 mg total) by mouth daily.      Gastroenterology: Proton Pump Inhibitors Passed - 08/21/2020  1:46 PM      Passed - Valid encounter within last 12 months    Recent Outpatient Visits           1 month ago Gastroesophageal reflux disease, unspecified whether esophagitis present   [facility] Valentino Nose, NP   2 months ago Erroneous encounter - disregard   [facility] Valentino Nose, NP   2 months ago Upper respiratory tract infection, unspecified type   [facility] Valentino Nose, NP   1 year ago Cellulitis of left lower extremity   [facility] Roosvelt Maser [place], [place]   1 year ago Cough   [facility] Roosvelt Maser [place], [place]

## 2020-09-08 ENCOUNTER — Other Ambulatory Visit: Payer: Self-pay

## 2020-09-09 MED ORDER — GABAPENTIN 300 MG PO CAPS
300.0000 mg | ORAL_CAPSULE | Freq: Three times a day (TID) | ORAL | 0 refills | Status: DC
Start: 2020-09-09 — End: 2020-10-17

## 2020-09-09 NOTE — Telephone Encounter (Signed)
Refill for Gabapentin 300 mg TID.  Last OV was 07/02/20 Up coming 07/31/21

## 2020-09-11 NOTE — Telephone Encounter (Signed)
Lvm to make this apt. 

## 2020-09-12 NOTE — Telephone Encounter (Signed)
Lvm to make this apt. 

## 2020-09-13 NOTE — Telephone Encounter (Signed)
Pt scheduled to for 10/17/2020

## 2020-09-19 ENCOUNTER — Telehealth: Payer: Self-pay

## 2020-09-19 NOTE — Telephone Encounter (Signed)
Scheduled virtual for tomorrow with karen   Copied from CRM [id number]. Topic: Appointment Scheduling - Scheduling Inquiry for Clinic >> Sep 19, 2020  3:58 PM Daphine Deutscher D wrote: Reason for CRM: Pt called saying he has been having a cough for a couple of days.  He got his first Pfizer vaccine last night.  He feels like he has bronchitis.  The cough started before and he was feeling bad before the vaccine.    CB#  [phone]

## 2020-09-20 ENCOUNTER — Ambulatory Visit
Admission: RE | Admit: 2020-09-20 | Discharge: 2020-09-20 | Disposition: A | Discharge status: Home / Self Care | Payer: Medicare Other | Attending: Nurse Practitioner | Admitting: Nurse Practitioner

## 2020-09-20 ENCOUNTER — Encounter: Payer: Self-pay | Admitting: Nurse Practitioner

## 2020-09-20 ENCOUNTER — Other Ambulatory Visit: Payer: Self-pay

## 2020-09-20 ENCOUNTER — Ambulatory Visit
Admission: RE | Admit: 2020-09-20 | Discharge: 2020-09-20 | Disposition: A | Discharge status: Home / Self Care | Payer: Medicare Other | Source: Ambulatory Visit | Attending: Nurse Practitioner | Admitting: Nurse Practitioner

## 2020-09-20 ENCOUNTER — Telehealth (INDEPENDENT_AMBULATORY_CARE_PROVIDER_SITE_OTHER): Payer: Medicare Other | Admitting: Nurse Practitioner

## 2020-09-20 VITALS — BP 142/77 | HR 95 | Temp 97.7°F | Wt 208.2 lb

## 2020-09-20 DIAGNOSIS — R059 Cough, unspecified: Secondary | ICD-10-CM | POA: Insufficient documentation

## 2020-09-20 DIAGNOSIS — J069 Acute upper respiratory infection, unspecified: Secondary | ICD-10-CM

## 2020-09-20 MED ORDER — GUAIFENESIN-CODEINE 100-10 MG/5ML PO SOLN: 5.0000 mL | Freq: Two times a day (BID) | ORAL | 0 refills | Status: DC | PRN

## 2020-09-20 MED ORDER — BENZONATATE 200 MG PO CAPS: 200.0000 mg | ORAL_CAPSULE | Freq: Three times a day (TID) | ORAL | 0 refills | Status: DC | PRN

## 2020-09-20 NOTE — Progress Notes (Signed)
BP (!) 142/77   Pulse 95   Temp 97.7 F (36.5 C) (Oral)   Wt 208 lb 3.2 oz (94.4 kg)   SpO2 98%   BMI 32.61 kg/m    Subjective:    Patient ID: Mike Kline, male    DOB: 1970/09/10, 50 y.o.   MRN: [id number]  HPI: Mike Kline is a 50 y.o. male  Chief Complaint  Patient presents with  . URI    Pt states he has had congestion, runny nose, and a cough since Sunday night / Monday morning. States he has not been around anyone with covid and has not had a covid test. States that he did receive his vaccine on Tuesday.    UPPER RESPIRATORY TRACT INFECTION Patient states that symptoms started on Sunday.  Worst symptom: cough and fatigue Fever: no Cough: yes Shortness of breath: no Wheezing: yes Chest pain: no Chest tightness: no Chest congestion: yes Nasal congestion: yes Runny nose: yes Post nasal drip: yes Sneezing: yes Sore throat: yes Swollen glands: no Sinus pressure: yes Headache: yes Face pain: no Toothache: no Ear pain: no  Ear pressure: no  Eyes red/itching:no Eye drainage/crusting: no  Vomiting: no Rash: no Fatigue: yes Sick contacts: yes Strep contacts: no  Context: stable Recurrent sinusitis: no Relief with OTC cold/cough medications: yes  Treatments attempted: theraflu and cough syrup    Relevant past medical, surgical, family and social history reviewed and updated as indicated. Interim medical history since our last visit reviewed. Allergies and medications reviewed and updated.  Review of Systems  Constitutional: Positive for fatigue. Negative for fever.  HENT: Positive for sore throat.   Respiratory: Positive for cough and wheezing. Negative for shortness of breath.   Cardiovascular: Negative for chest pain.  Neurological: Positive for headaches.    Per HPI unless specifically indicated above     Objective:    BP (!) 142/77   Pulse 95   Temp 97.7 F (36.5 C) (Oral)   Wt 208 lb 3.2 oz (94.4 kg)   SpO2 98%   BMI 32.61 kg/m   Wt  Readings from Last 3 Encounters:  09/20/20 208 lb 3.2 oz (94.4 kg)  07/29/20 194 lb (88 kg)  07/10/20 202 lb 3.2 oz (91.7 kg)    Physical Exam Vitals and nursing note reviewed.  Constitutional:      General: He is not in acute distress.    Appearance: He is not ill-appearing.  HENT:     Head: Normocephalic.     Right Ear: Hearing normal.     Left Ear: Hearing normal.     Nose: Nose normal.  Pulmonary:     Effort: Pulmonary effort is normal. No respiratory distress.  Neurological:     Mental Status: He is alert.  Psychiatric:        Mood and Affect: Mood normal.        Behavior: Behavior normal.        Thought Content: Thought content normal.        Judgment: Judgment normal.        Assessment & Plan:   Problem List Items Addressed This Visit      Respiratory   Upper respiratory tract infection - Primary   Relevant Medications   benzonatate (TESSALON) 200 MG capsule   guaiFENesin-codeine 100-10 MG/5ML syrup   Other Relevant Orders   DG Chest 2 View   Novel Coronavirus, NAA ([facility])    Other Visit Diagnoses    Cough  Use Benzonatate and Tussinex as needed for cough.     Relevant Medications   benzonatate (TESSALON) 200 MG capsule   guaiFENesin-codeine 100-10 MG/5ML syrup   Other Relevant Orders   DG Chest 2 View   Novel Coronavirus, NAA ([facility])       Follow up plan: Return if symptoms worsen or fail to improve.     This visit was completed via MyChart due to the restrictions of the COVID-19 pandemic. All issues as above were discussed and addressed. Physical exam was done as above through visual confirmation on MyChart. If it was felt that the patient should be evaluated in the office, they were directed there. The patient verbally consented to this visit. 1. Location of the patient: Home 2. Location of the provider: Office 3. Those involved with this call:  ? Provider: Larae Grooms, NP ? CMA: Wilhemena Durie, CMA ? Front Desk/Registration:  Harriet Pho 4. Time spent on call: 11 minutes with patient face to face via video conference. More than 50% of this time was spent in counseling and coordination of care. 20 minutes total spent in review of patient's record and preparation of their chart.

## 2020-09-20 NOTE — Telephone Encounter (Signed)
Error

## 2020-09-22 LAB — NOVEL CORONAVIRUS, NAA: SARS-CoV-2, NAA: NOT DETECTED

## 2020-09-22 LAB — SARS-COV-2, NAA 2 DAY TAT

## 2020-09-23 NOTE — Progress Notes (Signed)
Your COVID test was negative.  It is likely that you have a upper respiratory virus that will need to run its course.  I hope you are feeling better.

## 2020-09-23 NOTE — Progress Notes (Signed)
Hi Mike Kline.  Your Xray was normal.  No evidence of pneumonia or fluid in your lungs.  I hope you are feeling better.  Let us know if you need anything else.

## 2020-10-17 ENCOUNTER — Encounter: Payer: Self-pay | Admitting: *Deleted

## 2020-10-17 ENCOUNTER — Encounter: Payer: Self-pay | Admitting: Internal Medicine

## 2020-10-17 ENCOUNTER — Ambulatory Visit (INDEPENDENT_AMBULATORY_CARE_PROVIDER_SITE_OTHER): Payer: Medicare Other | Admitting: Internal Medicine

## 2020-10-17 ENCOUNTER — Other Ambulatory Visit: Payer: Self-pay

## 2020-10-17 VITALS — BP 100/60 | HR 74 | Temp 98.0°F | Ht 65.79 in | Wt 223.0 lb

## 2020-10-17 DIAGNOSIS — M2540 Effusion, unspecified joint: Secondary | ICD-10-CM | POA: Diagnosis not present

## 2020-10-17 DIAGNOSIS — R635 Abnormal weight gain: Secondary | ICD-10-CM

## 2020-10-17 DIAGNOSIS — E108 Type 1 diabetes mellitus with unspecified complications: Secondary | ICD-10-CM | POA: Diagnosis not present

## 2020-10-17 DIAGNOSIS — Z1211 Encounter for screening for malignant neoplasm of colon: Secondary | ICD-10-CM | POA: Diagnosis not present

## 2020-10-17 DIAGNOSIS — Z23 Encounter for immunization: Secondary | ICD-10-CM

## 2020-10-17 DIAGNOSIS — R1900 Intra-abdominal and pelvic swelling, mass and lump, unspecified site: Secondary | ICD-10-CM

## 2020-10-17 DIAGNOSIS — Z125 Encounter for screening for malignant neoplasm of prostate: Secondary | ICD-10-CM

## 2020-10-17 DIAGNOSIS — K449 Diaphragmatic hernia without obstruction or gangrene: Secondary | ICD-10-CM

## 2020-10-17 DIAGNOSIS — IMO0002 Reserved for concepts with insufficient information to code with codable children: Secondary | ICD-10-CM

## 2020-10-17 DIAGNOSIS — E1065 Type 1 diabetes mellitus with hyperglycemia: Secondary | ICD-10-CM

## 2020-10-17 MED ORDER — AMITRIPTYLINE HCL 100 MG PO TABS
100.0000 mg | ORAL_TABLET | Freq: Every day | ORAL | 3 refills | Status: DC
Start: 2020-10-17 — End: 2021-05-13

## 2020-10-17 MED ORDER — LOSARTAN POTASSIUM 25 MG PO TABS: 25.0000 mg | ORAL_TABLET | Freq: Every day | ORAL | 1 refills | Status: DC

## 2020-10-17 MED ORDER — GABAPENTIN 300 MG PO CAPS: 300.0000 mg | ORAL_CAPSULE | Freq: Three times a day (TID) | ORAL | 2 refills | Status: DC

## 2020-10-17 MED ORDER — FAMOTIDINE 20 MG PO TABS: 20.0000 mg | ORAL_TABLET | Freq: Every day | ORAL | 1 refills | Status: DC

## 2020-10-17 MED ORDER — ROSUVASTATIN CALCIUM 40 MG PO TABS: 40.0000 mg | ORAL_TABLET | Freq: Every day | ORAL | 2 refills | Status: DC

## 2020-10-17 MED ORDER — PRAMIPEXOLE DIHYDROCHLORIDE 0.125 MG PO TABS: 0.1250 mg | ORAL_TABLET | Freq: Every evening | ORAL | 0 refills | Status: DC

## 2020-10-17 MED ORDER — METOPROLOL TARTRATE 25 MG PO TABS: 25.0000 mg | ORAL_TABLET | Freq: Two times a day (BID) | ORAL | 1 refills | Status: DC

## 2020-10-17 NOTE — Patient Instructions (Signed)
Arthritis Arthritis means joint pain. It can also mean joint disease. A joint is a place where bones come together. There are more than 100 types of arthritis. What are the causes? This condition may be caused by:  Wear and tear of a joint. This is the most common cause.  A lot of acid in the blood, which leads to pain in the joint (gout).  Pain and swelling (inflammation) in a joint.  Infection of a joint.  Injuries in the joint.  A reaction to medicines (allergy). In some cases, the cause may not be known. What are the signs or symptoms? Symptoms of this condition include:  Redness at a joint.  Swelling at a joint.  Stiffness at a joint.  Warmth coming from the joint.  A fever.  A feeling of being sick. How is this treated? This condition may be treated with:  Treating the cause, if it is known.  Rest.  Raising (elevating) the joint.  Putting cold or hot packs on the joint.  Medicines to treat symptoms and reduce pain and swelling.  Shots of medicines (cortisone) into the joint. You may also be told to make changes in your life, such as doing exercises and losing weight. Follow these instructions at home: Medicines  Take over-the-counter and prescription medicines only as told by your doctor.  Do not take aspirin for pain if your doctor says that you may have gout. Activity  Rest your joint if your doctor tells you to.  Avoid activities that make the pain worse.  Exercise your joint regularly as told by your doctor. Try doing exercises like: ? Swimming. ? Water aerobics. ? Biking. ? Walking. Managing pain, stiffness, and swelling  If told, put ice on the affected area. ? Put ice in a plastic bag. ? Place a towel between your skin and the bag. ? Leave the ice on for 20 minutes, 2-3 times per day.  If your joint is swollen, raise (elevate) it above the level of your heart if told by your doctor.  If your joint feels stiff in the morning, try  taking a warm shower.  If told, put heat on the affected area. Do this as often as told by your doctor. Use the heat source that your doctor recommends, such as a moist heat pack or a heating pad. If you have diabetes, do not apply heat without asking your doctor. To apply heat: ? Place a towel between your skin and the heat source. ? Leave the heat on for 20-30 minutes. ? Remove the heat if your skin turns bright red. This is very important if you are unable to feel pain, heat, or cold. You may have a greater risk of getting burned.      General instructions  Do not use any products that contain nicotine or tobacco, such as cigarettes, e-cigarettes, and chewing tobacco. If you need help quitting, ask your doctor.  Keep all follow-up visits as told by your doctor. This is important. Contact a doctor if:  The pain gets worse.  You have a fever. Get help right away if:  You have very bad pain in your joint.  You have swelling in your joint.  Your joint is red.  Many joints become painful and swollen.  You have very bad back pain.  Your leg is very weak.  You cannot control your pee (urine) or poop (stool). Summary  Arthritis means joint pain. It can also mean joint disease. A joint is a   place where bones come together.  The most common cause of this condition is wear and tear of a joint.  Symptoms of this condition include redness, swelling, or stiffness of the joint.  This condition is treated with rest, raising the joint, medicines, and putting cold or hot packs on the joint.  Follow your doctor's instructions about medicines, activity, exercises, and other home care treatments. This information is not intended to replace advice given to you by your health care provider. Make sure you discuss any questions you have with your health care provider. Document Revised: 07/04/2018 Document Reviewed: 07/04/2018 Elsevier Patient Education  2021 [employer]  

## 2020-10-17 NOTE — Progress Notes (Signed)
BP 100/60   Pulse 74   Temp 98 F (36.7 C) (Oral)   Ht 5' 5.79" (1.671 m)   Wt 223 lb (101.2 kg)   SpO2 97%   BMI 36.23 kg/m    Subjective:    Patient ID: Mike Kline, male    DOB: [birth year], 50 y.o.   MRN: [id number]  HPI: Mike Kline is a 50 y.o. male  Had COVID in nov was in ICU x 5 days and total of about 7 days .  Has a PMH of DM. Type 1 DM has a continuous glucometer   Headache  This is a chronic problem. Associated symptoms include blurred vision and a visual change. Pertinent negatives include no rhinorrhea, weakness or weight loss.  Cough This is a chronic (pe rpt has a hiatal hernia and has a cough sec to such ) problem. The current episode started more than 1 year ago. Pertinent negatives include no chest pain, heartburn, hemoptysis, rhinorrhea, shortness of breath or weight loss.  Diabetes He presents for his follow-up (sees Endo for such Dr. Lynnea Ferrier over [facility] is on humalog SSI and lantus 40 untis, A1c - 8 / 9  has had lows as well - 77 , 88 ) diabetic visit. He has type 1 (265 , 272, 258 post prandial ) diabetes mellitus. Associated symptoms include blurred vision, foot paresthesias and visual change. Pertinent negatives for diabetes include no chest pain, no fatigue, no foot ulcerations, no polydipsia, no polyphagia, no polyuria, no weakness and no weight loss. Symptoms are worsening.    Chief Complaint  Patient presents with  . Headache    Right side from top of head down to ear, started last night  . Medication Refill  . Cough    Had covid in Nov.     Relevant past medical, surgical, family and social history reviewed and updated as indicated. Interim medical history since our last visit reviewed. Allergies and medications reviewed and updated.  Review of Systems  Constitutional: Negative for fatigue and weight loss.  HENT: Negative for rhinorrhea.   Eyes: Positive for blurred vision.  Respiratory: Negative for hemoptysis and shortness of breath.    Cardiovascular: Negative for chest pain.  Gastrointestinal: Negative for heartburn.  Endocrine: Negative for polydipsia, polyphagia and polyuria.  Neurological: Negative for weakness.    Per HPI unless specifically indicated above     Objective:    BP 100/60   Pulse 74   Temp 98 F (36.7 C) (Oral)   Ht 5' 5.79" (1.671 m)   Wt 223 lb (101.2 kg)   SpO2 97%   BMI 36.23 kg/m   Wt Readings from Last 3 Encounters:  10/17/20 223 lb (101.2 kg)  09/20/20 208 lb 3.2 oz (94.4 kg)  07/29/20 194 lb (88 kg)    Physical Exam Vitals and nursing note reviewed.  Constitutional:      General: He is not in acute distress.    Appearance: Normal appearance. He is not ill-appearing or diaphoretic.  HENT:     Head: Normocephalic and atraumatic.     Right Ear: Tympanic membrane and external ear normal. There is no impacted cerumen.     Left Ear: External ear normal.     Nose: No congestion or rhinorrhea.     Mouth/Throat:     Pharynx: No oropharyngeal exudate or posterior oropharyngeal erythema.  Eyes:     Conjunctiva/sclera: Conjunctivae normal.     Pupils: Pupils are equal, round, and reactive to light.  Cardiovascular:     Rate and Rhythm: Normal rate and regular rhythm.     Heart sounds: No murmur heard. No friction rub. No gallop.   Pulmonary:     Effort: No respiratory distress.     Breath sounds: No stridor. No wheezing or rhonchi.  Chest:     Chest wall: No tenderness.  Abdominal:     General: Abdomen is flat. Bowel sounds are normal. There is no distension.     Palpations: Abdomen is soft. There is no mass.     Tenderness: There is no abdominal tenderness. There is no guarding.  Musculoskeletal:        General: No swelling or deformity.     Cervical back: Normal range of motion and neck supple. No rigidity or tenderness.     Right lower leg: No edema.     Left lower leg: No edema.  Skin:    General: Skin is warm and dry.     Coloration: Skin is not jaundiced.      Findings: No erythema.  Neurological:     Mental Status: He is alert and oriented to person, place, and time. Mental status is at baseline.  Psychiatric:        Mood and Affect: Mood normal.        Behavior: Behavior normal.        Thought Content: Thought content normal.        Judgment: Judgment normal.     Results for orders placed or performed in visit on 09/20/20  Novel Coronavirus, NAA ([facility])   Specimen: Nasopharyngeal(NP) swabs in vial transport medium  Result Value Ref Range   SARS-CoV-2, NAA Not Detected Not Detected  SARS-COV-2, NAA 2 DAY TAT  Result Value Ref Range   SARS-CoV-2, NAA 2 DAY TAT Performed       Assessment & Plan:  1. Dm type 1 : Continue mx per endocrine check HbA1c,  urine  microalbumin  diabetic diet plan given to pt  adviced regarding hypoglycemia and instructions given to pt today on how to prevent and treat the same if it were to occur. pt acknowledges the plan and voices understanding of the same.  exercise plan given and encouraged.   advice diabetic yearly podiatry, ophthalmology , nutritionist , dental check q 6 months,   2. HLD : is on crestor for such recheck FLP, check LFT's work on diet, SE of meds explained to pt. low fat and high fiber diet explained to pt.   3. RLS - is on mirapex and gabapentin  4. HTN is on losartan  And metoprolol  HTN :  Continue current meds.  Medication compliance emphasised. pt advised to keep Bp logs. Pt verbalised understanding of the same. Pt to have a low salt diet . Exercise to reach a goal of at least 150 mins a week.  lifestyle modifications explained and pt understands importance of the above.  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   6. Arthritis  ? sec RA vs Gout CHeck RF/ ANA / uric acid. check xrays of swollen joints. consider referral to rheumatology   7 abdominal swelling / ascites? Will check xray and [place] asap Check LFts  Refer to GI asap.   Problem List Items Addressed This Visit       Endocrine   Type I diabetes mellitus with manifestations, uncontrolled (HCC)   Relevant Medications   losartan (COZAAR) 25 MG tablet   rosuvastatin (CRESTOR) 40 MG tablet  Other Relevant Orders   Comprehensive metabolic panel   CBC with Differential/Platelet   Bayer DCA Hb A1c Waived   Lipid panel   Microalbumin, Urine Waived (STAT)    Other Visit Diagnoses    Hiatal hernia    -  Primary   Relevant Orders   Ambulatory referral to Gastroenterology   Screen for colon cancer       Relevant Orders   Ambulatory referral to Gastroenterology   Joint swelling       Relevant Orders   DG Knee Bilateral Standing AP   Antinuclear Antib (ANA)   Uric acid   Rheumatoid Factor   Abdominal swelling       Relevant Orders   Comprehensive metabolic panel   CBC with Differential/Platelet   DG Abd 2 Views   US Abdomen Complete   Screening for prostate cancer       Relevant Orders   PSA Total+%Free (Serial)   Weight gain       Relevant Orders   Thyroid Panel With TSH   US Abdomen Complete   Need for vaccination       Relevant Orders   Pneumococcal conjugate vaccine 13-valent IM (Completed)       Follow up plan: No follow-ups on file.  Health Maintenance : PSA : check Cscope : due  Pneumonia vaccine :pneumovax  Today

## 2020-10-19 ENCOUNTER — Encounter: Payer: Self-pay | Admitting: Internal Medicine

## 2020-10-21 ENCOUNTER — Other Ambulatory Visit: Payer: Self-pay | Admitting: Nurse Practitioner

## 2020-10-21 ENCOUNTER — Encounter: Payer: Self-pay | Admitting: Internal Medicine

## 2020-10-21 MED ORDER — HYDROCOD POLST-CPM POLST ER 10-8 MG/5ML PO SUER: 5.0000 mL | Freq: Two times a day (BID) | ORAL | 0 refills | Status: DC | PRN

## 2020-10-21 NOTE — Telephone Encounter (Signed)
Pt's spouse lisa called in to check the status of this she is saying that the pt has a very bad cough he's type 1 diabetic he's had open heart surgery and she hates to see him like this. Please advise she is wanting to know something by eod

## 2020-10-22 ENCOUNTER — Emergency Department
Admission: EM | Admit: 2020-10-22 | Discharge: 2020-10-22 | Disposition: A | Discharge status: Home / Self Care | Payer: Medicare Other | Attending: Emergency Medicine | Admitting: Emergency Medicine

## 2020-10-22 ENCOUNTER — Other Ambulatory Visit: Payer: Self-pay

## 2020-10-22 ENCOUNTER — Emergency Department: Payer: Medicare Other

## 2020-10-22 DIAGNOSIS — Z8616 Personal history of COVID-19: Secondary | ICD-10-CM | POA: Diagnosis not present

## 2020-10-22 DIAGNOSIS — Z79899 Other long term (current) drug therapy: Secondary | ICD-10-CM | POA: Diagnosis not present

## 2020-10-22 DIAGNOSIS — R059 Cough, unspecified: Secondary | ICD-10-CM

## 2020-10-22 DIAGNOSIS — I119 Hypertensive heart disease without heart failure: Secondary | ICD-10-CM | POA: Diagnosis not present

## 2020-10-22 DIAGNOSIS — J40 Bronchitis, not specified as acute or chronic: Secondary | ICD-10-CM | POA: Insufficient documentation

## 2020-10-22 DIAGNOSIS — I2510 Atherosclerotic heart disease of native coronary artery without angina pectoris: Secondary | ICD-10-CM | POA: Diagnosis not present

## 2020-10-22 DIAGNOSIS — Z794 Long term (current) use of insulin: Secondary | ICD-10-CM | POA: Diagnosis not present

## 2020-10-22 DIAGNOSIS — Z951 Presence of aortocoronary bypass graft: Secondary | ICD-10-CM | POA: Insufficient documentation

## 2020-10-22 DIAGNOSIS — E109 Type 1 diabetes mellitus without complications: Secondary | ICD-10-CM | POA: Insufficient documentation

## 2020-10-22 MED ORDER — AZITHROMYCIN 500 MG PO TABS
500.0000 mg | ORAL_TABLET | Freq: Once | ORAL | Status: AC
  Administered 2020-10-22: 500 mg via ORAL
  Filled 2020-10-22: qty 1

## 2020-10-22 MED ORDER — HYDROCOD POLST-CPM POLST ER 10-8 MG/5ML PO SUER
5.0000 mL | Freq: Once | ORAL | Status: AC
Start: 2020-10-22 — End: 2020-10-22
  Administered 2020-10-22: 5 mL via ORAL
  Filled 2020-10-22: qty 5

## 2020-10-22 MED ORDER — AZITHROMYCIN 250 MG PO TABS: 250.0000 mg | ORAL_TABLET | Freq: Every day | ORAL | 0 refills | Status: DC

## 2020-10-22 NOTE — Discharge Instructions (Signed)
Finish antibiotic as prescribed (Azithromycin 250mg  daily x4 days).  Take Tussionex as prescribed by your doctor as needed for cough.  Return to the ER for worsening symptoms, persistent vomiting, difficulty breathing or other concerns

## 2020-10-22 NOTE — ED Provider Notes (Signed)
[facility] Emergency Department Provider Note   ____________________________________________   Event Date/Time   First MD Initiated Contact with Patient 10/22/20 [phone]     (approximate)  I have reviewed the triage vital signs and the nursing notes.   HISTORY  Chief Complaint Cough    HPI ADONI GREENOUGH is a 50 y.o. male who presents to the ED from home with a chief complaint of nonproductive cough.  Patient with a history of hypertension, diabetes, hyperlipidemia who reports onset of nonproductive cough x3 days.  Denies fever, chills, chest pain, shortness of breath, abdominal pain, nausea or vomiting.  Patient has had COVID-19 within the past 6 months and is vaccinated.  States he has been keeping a close eye on his blood sugars and they are slightly higher than baseline which he expects as he has had bronchitis/URI in the past.     Past Medical History:  Diagnosis Date  . Adhesive capsulitis    permanent disability 07/2010, left shoulder  . Anginal pain (HCC)   . Blindness of left eye   . Diabetes mellitus without complication (HCC)   . Diabetic, retinopathy, proliferative (HCC)   . GERD (gastroesophageal reflux disease)   . Hyperlipidemia   . Hypertension     Patient Active Problem List   Diagnosis Date Noted  . Blindness of left eye 07/02/2020  . Depression 07/02/2020  . Respiratory failure (HCC) 06/13/2020  . Upper respiratory tract infection 06/03/2020  . 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 Acute blood loss anemia 06/07/2019  . Acute postoperative pain 06/07/2019  . AKI (acute kidney injury) (HCC) 06/07/2019  . Hyponatremia 06/07/2019  . Ileus (HCC) 06/07/2019  . Physical deconditioning 06/07/2019  . PONV (postoperative nausea and vomiting) 06/07/2019  . 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 Coronary atherosclerosis due to lipid rich plaque 05/23/2019  . Anxiety 04/14/2019  . Insomnia  05/28/2016  . Adhesive capsulitis 12/17/2015  . Hypercholesterolemia 10/22/2015  . Diabetic, retinopathy, proliferative (HCC) 05/28/2015  . Diabetes mellitus type 1 with peripheral artery disease (HCC) 05/28/2015  . Hypertension   . Type I diabetes mellitus with manifestations, uncontrolled (HCC)   . GERD (gastroesophageal reflux disease)   . Nuclear sclerotic cataract 07/05/2013  . Type 1 diabetes mellitus (HCC) 05/29/2013    Past Surgical History:  Procedure Laterality Date  . APPENDECTOMY    . CARPAL TUNNEL RELEASE Bilateral   . cataract surgery Bilateral 2014  . EYE SURGERY Left   . LEFT HEART CATH AND CORONARY ANGIOGRAPHY Left 05/22/2019   Procedure: LEFT HEART CATH AND CORONARY ANGIOGRAPHY;  Surgeon: Alwyn Pea, MD;  Location: [facility] INVASIVE CV LAB;  Service: Cardiovascular;  Laterality: Left;  . SHOULDER SURGERY Left 2009    Prior to Admission medications   Medication Sig Start Date End Date Taking? Authorizing Provider  azithromycin (ZITHROMAX) 250 MG tablet Take 1 tablet (250 mg total) by mouth daily. 10/22/20  Yes Irean Hong, MD  amitriptyline (ELAVIL) 100 MG tablet Take 1 tablet (100 mg total) by mouth at bedtime. 10/17/20   Vigg, Avanti, MD  benzonatate (TESSALON) 200 MG capsule Take 1 capsule (200 mg total) by mouth 3 (three) times daily as needed for cough. 09/20/20   Larae Grooms, NP  chlorpheniramine-HYDROcodone [facility] PENNKINETIC ER) 10-8 MG/5ML SUER Take 5 mLs by mouth every 12 (twelve) hours as needed for cough. 10/21/20   Cannady, Corrie Dandy T, NP  famotidine (PEPCID) 20 MG tablet Take 1 tablet (20 mg  total) by mouth at bedtime. 10/17/20   Vigg, Avanti, MD  gabapentin (NEURONTIN) 300 MG capsule Take 1 capsule (300 mg total) by mouth 3 (three) times daily. 10/17/20   Vigg, Avanti, MD  HUMALOG KWIKPEN 100 UNIT/ML KiwkPen USE THREE TIMES A DAY AS DIRECTED.(UP TO 50 UNITS A DAY OR AS DIRECTED BY SLIDING SCALE) Patient taking differently: Inject 6-10 Units into  the skin 3 (three) times daily. 12/30/15   Steele Sizerrissman, Mark A, MD  insulin glargine (LANTUS SOLOSTAR) 100 UNIT/ML Solostar Pen Inject 40 Units into the skin daily at 10 pm. 06/20/20   Darlin PriestlyLai, Tina, MD  losartan (COZAAR) 25 MG tablet Take 1 tablet (25 mg total) by mouth daily. 10/17/20   Vigg, Avanti, MD  metoprolol tartrate (LOPRESSOR) 25 MG tablet Take 1 tablet (25 mg total) by mouth 2 (two) times daily. 10/17/20   Vigg, Avanti, MD  pantoprazole (PROTONIX) 40 MG tablet Take 1 tablet (40 mg total) by mouth daily. 08/21/20   Cannady, Corrie DandyJolene T, NP  pramipexole (MIRAPEX) 0.125 MG tablet Take 1 tablet (0.125 mg total) by mouth every evening. 10/17/20   Vigg, Avanti, MD  rosuvastatin (CRESTOR) 40 MG tablet Take 1 tablet (40 mg total) by mouth daily. 10/17/20   Vigg, Avanti, MD  sildenafil (VIAGRA) 50 MG tablet Take 50 mg by mouth daily as needed. 09/18/20   [provider]  tadalafil (CIALIS) 10 MG tablet Take 10 mg by mouth daily as needed. 05/09/20 07/08/20  [provider]    Allergies Patient has no known allergies.  Family History  Problem Relation Age of Onset  . Bone cancer Mother     Social History Social History   Tobacco Use  . Smoking status: Never Smoker  . Smokeless tobacco: Never Used  Vaping Use  . Vaping Use: Never used  Substance Use Topics  . Alcohol use: Not Currently    Comment: socially  . Drug use: No    Review of Systems  Constitutional: No fever/chills Eyes: No visual changes. ENT: No sore throat. Cardiovascular: Denies chest pain. Respiratory: Positive for nonproductive cough.  Denies shortness of breath. Gastrointestinal: No abdominal pain.  No nausea, no vomiting.  No diarrhea.  No constipation. Genitourinary: Negative for dysuria. Musculoskeletal: Negative for back pain. Skin: Negative for rash. Neurological: Negative for headaches, focal weakness or numbness.   ____________________________________________   PHYSICAL EXAM:  VITAL  SIGNS: ED Triage Vitals  Enc Vitals Group     BP 10/22/20 0357 121/87     Pulse Rate 10/22/20 0355 (!) 107     Resp 10/22/20 0355 20     Temp 10/22/20 0355 98.3 F (36.8 C)     Temp Source 10/22/20 0355 Oral     SpO2 10/22/20 0355 98 %     Weight 10/22/20 0356 208 lb (94.3 kg)     Height 10/22/20 0356 5\' 7"  (1.702 m)     Head Circumference --      Peak Flow --      Pain Score 10/22/20 0355 7     Pain Loc --      Pain Edu? --      Excl. in GC? --     Constitutional: Alert and oriented. Well appearing and in no acute distress. Eyes: Conjunctivae are normal. PERRL. EOMI. Head: Atraumatic. Nose: No congestion/rhinnorhea. Mouth/Throat: Mucous membranes are moist.  Oropharynx non-erythematous. Neck: No stridor.   Cardiovascular: Normal rate, regular rhythm. Grossly normal heart sounds.  Good peripheral circulation. Respiratory: Normal respiratory effort.  No retractions. Lungs CTAB.  Active loose cough noted. Gastrointestinal: Soft and nontender. No distention. No abdominal bruits. No CVA tenderness. Musculoskeletal: No lower extremity tenderness nor edema.  No joint effusions. Neurologic:  Normal speech and language. No gross focal neurologic deficits are appreciated. No gait instability. Skin:  Skin is warm, dry and intact. No rash noted. Psychiatric: Mood and affect are normal. Speech and behavior are normal.  ____________________________________________   LABS (all labs ordered are listed, but only abnormal results are displayed)  Labs Reviewed - No data to display ____________________________________________  EKG  None ____________________________________________  RADIOLOGY I, Zahli Vetsch J, personally viewed and evaluated these images (plain radiographs) as part of my medical decision making, as well as reviewing the written report by the radiologist.  ED MD interpretation: No pneumonia  Official radiology report(s): DG Chest 2 View  Result Date:  10/22/2020 CLINICAL DATA:  Cough since Saturday. EXAM: CHEST - 2 VIEW COMPARISON:  09/20/2020 FINDINGS: Normal heart size and mediastinal contours. CABG and left atrial clipping. Left perihilar reticulation that is stable and has a scar-like appearance. No consolidation, edema, effusion, or pneumothorax. IMPRESSION: Stable postoperative chest. Electronically Signed   By: Marnee Spring M.D.   On: 10/22/2020 04:36    ____________________________________________   PROCEDURES  Procedure(s) performed (including Critical Care):  Procedures   ____________________________________________   INITIAL IMPRESSION / ASSESSMENT AND PLAN / ED COURSE  As part of my medical decision making, I reviewed the following data within the electronic MEDICAL RECORD NUMBER Nursing notes reviewed and incorporated, Old chart reviewed (10/21/2020 telephone communications with PCP), Radiograph reviewed and Notes from prior ED visits     50 year old male presenting with nonproductive cough.  He is afebrile, not tachycardic nor hypoxic when not coughing.  When coughing, he has mild tachycardia with heart rate to 107 and transient drop in O2 sat to 90% which immediately bumped back up to 98%.  No pneumonia seen on chest x-ray but will empirically treat with Z-Pak given his comorbidities.  Hold steroids as patient is a type I diabetic.  Tussionex given but prescription held as it looks like patient's PCP called in a prescription already to patient's pharmacy.  Looks like he has an appointment on Wednesday for follow-up.  Strict return precautions given.  Patient verbalizes understanding agrees with plan of care.      ____________________________________________   FINAL CLINICAL IMPRESSION(S) / ED DIAGNOSES  Final diagnoses:  Cough  Bronchitis     ED Discharge Orders         Ordered    azithromycin (ZITHROMAX) 250 MG tablet  Daily        10/22/20 0449          *Please note:  CLARENCE COGSWELL was evaluated in  Emergency Department on 10/22/2020 for the symptoms described in the history of present illness. He was evaluated in the context of the global COVID-19 pandemic, which necessitated consideration that the patient might be at risk for infection with the SARS-CoV-2 virus that causes COVID-19. Institutional protocols and algorithms that pertain to the evaluation of patients at risk for COVID-19 are in a state of rapid change based on information released by regulatory bodies including the CDC and federal and state organizations. These policies and algorithms were followed during the patient's care in the ED.  Some ED evaluations and interventions may be delayed as a result of limited staffing during and the pandemic.*   Note:  This document was prepared using Conservation officer, historic buildings and  may include unintentional dictation errors.   Irean Hong, MD 10/22/20 [phone]

## 2020-10-22 NOTE — ED Triage Notes (Signed)
Pt in with co cough since Saturday, no fever. Cough is nonproductive at this time.

## 2020-10-23 ENCOUNTER — Other Ambulatory Visit: Payer: Medicare Other

## 2020-10-23 ENCOUNTER — Other Ambulatory Visit: Payer: Self-pay

## 2020-10-23 ENCOUNTER — Ambulatory Visit: Payer: Medicare Other

## 2020-10-23 DIAGNOSIS — E1065 Type 1 diabetes mellitus with hyperglycemia: Secondary | ICD-10-CM

## 2020-10-23 DIAGNOSIS — E7800 Pure hypercholesterolemia, unspecified: Secondary | ICD-10-CM

## 2020-10-23 DIAGNOSIS — IMO0002 Reserved for concepts with insufficient information to code with codable children: Secondary | ICD-10-CM

## 2020-10-23 DIAGNOSIS — Z125 Encounter for screening for malignant neoplasm of prostate: Secondary | ICD-10-CM

## 2020-10-23 DIAGNOSIS — M2540 Effusion, unspecified joint: Secondary | ICD-10-CM

## 2020-10-23 DIAGNOSIS — Z1329 Encounter for screening for other suspected endocrine disorder: Secondary | ICD-10-CM

## 2020-10-23 DIAGNOSIS — I10 Essential (primary) hypertension: Secondary | ICD-10-CM

## 2020-10-23 LAB — MICROALBUMIN, URINE WAIVED
Creatinine, Urine Waived: 100 mg/dL (ref 10–300)
Microalb, Ur Waived: 80 mg/L — ABNORMAL HIGH (ref 0–19)

## 2020-10-24 LAB — CBC WITH DIFFERENTIAL/PLATELET
Basophils Absolute: 0 10*3/uL (ref 0.0–0.2)
Basos: 1 %
EOS (ABSOLUTE): 0.3 10*3/uL (ref 0.0–0.4)
Eos: 6 %
Hematocrit: 38.4 % (ref 37.5–51.0)
Hemoglobin: 12.2 g/dL — ABNORMAL LOW (ref 13.0–17.7)
Immature Grans (Abs): 0.1 10*3/uL (ref 0.0–0.1)
Immature Granulocytes: 1 %
Lymphocytes Absolute: 1.4 10*3/uL (ref 0.7–3.1)
Lymphs: 27 %
MCH: 24.2 pg — ABNORMAL LOW (ref 26.6–33.0)
MCHC: 31.8 g/dL (ref 31.5–35.7)
MCV: 76 fL — ABNORMAL LOW (ref 79–97)
Monocytes Absolute: 0.6 10*3/uL (ref 0.1–0.9)
Monocytes: 12 %
Neutrophils Absolute: 2.7 10*3/uL (ref 1.4–7.0)
Neutrophils: 53 %
Platelets: 264 10*3/uL (ref 150–450)
RBC: 5.05 x10E6/uL (ref 4.14–5.80)
RDW: 14.2 % (ref 11.6–15.4)
WBC: 5.2 10*3/uL (ref 3.4–10.8)

## 2020-10-24 LAB — COMPREHENSIVE METABOLIC PANEL
ALT: 23 IU/L (ref 0–44)
AST: 37 IU/L (ref 0–40)
Albumin/Globulin Ratio: 1.4 (ref 1.2–2.2)
Albumin: 4 g/dL (ref 3.8–4.9)
Alkaline Phosphatase: 122 IU/L — ABNORMAL HIGH (ref 44–121)
BUN/Creatinine Ratio: 15 (ref 9–20)
BUN: 19 mg/dL (ref 6–24)
Bilirubin Total: 0.2 mg/dL (ref 0.0–1.2)
CO2: 21 mmol/L (ref 20–29)
Calcium: 8.9 mg/dL (ref 8.7–10.2)
Chloride: 97 mmol/L (ref 96–106)
Creatinine, Ser: 1.28 mg/dL — ABNORMAL HIGH (ref 0.76–1.27)
Globulin, Total: 2.9 g/dL (ref 1.5–4.5)
Glucose: 229 mg/dL — ABNORMAL HIGH (ref 65–99)
Potassium: 4.4 mmol/L (ref 3.5–5.2)
Sodium: 135 mmol/L (ref 134–144)
Total Protein: 6.9 g/dL (ref 6.0–8.5)
eGFR: 68 mL/min/{1.73_m2} (ref >59)

## 2020-10-24 LAB — HEMOGLOBIN A1C
Est. average glucose Bld gHb Est-mCnc: 255 mg/dL
Hgb A1c MFr Bld: 10.5 % — ABNORMAL HIGH (ref 4.8–5.6)

## 2020-10-24 LAB — LIPID PANEL
Chol/HDL Ratio: 2.9 ratio (ref 0.0–5.0)
Cholesterol, Total: 109 mg/dL (ref 100–199)
HDL: 38 mg/dL — ABNORMAL LOW (ref >39)
LDL Chol Calc (NIH): 48 mg/dL (ref 0–99)
Triglycerides: 126 mg/dL (ref 0–149)
VLDL Cholesterol Cal: 23 mg/dL (ref 5–40)

## 2020-10-24 LAB — TSH: TSH: 2.32 u[IU]/mL (ref 0.450–4.500)

## 2020-10-24 LAB — PSA: Prostate Specific Ag, Serum: 0.2 ng/mL (ref 0.0–4.0)

## 2020-10-24 LAB — RHEUMATOID FACTOR: Rheumatoid fact SerPl-aCnc: 10.5 IU/mL (ref <14.0)

## 2020-10-24 LAB — ANA: Anti Nuclear Antibody (ANA): NEGATIVE

## 2020-10-24 LAB — URIC ACID: Uric Acid: 5.6 mg/dL (ref 3.8–8.4)

## 2020-10-25 ENCOUNTER — Other Ambulatory Visit: Payer: Self-pay

## 2020-10-25 ENCOUNTER — Ambulatory Visit
Admission: RE | Admit: 2020-10-25 | Discharge: 2020-10-25 | Disposition: A | Discharge status: Home / Self Care | Payer: Medicare Other | Source: Ambulatory Visit | Attending: Internal Medicine | Admitting: Internal Medicine

## 2020-10-25 DIAGNOSIS — R635 Abnormal weight gain: Secondary | ICD-10-CM | POA: Diagnosis present

## 2020-10-25 DIAGNOSIS — R1900 Intra-abdominal and pelvic swelling, mass and lump, unspecified site: Secondary | ICD-10-CM | POA: Diagnosis not present

## 2020-10-29 ENCOUNTER — Other Ambulatory Visit: Payer: Self-pay | Admitting: Internal Medicine

## 2020-10-29 DIAGNOSIS — R1900 Intra-abdominal and pelvic swelling, mass and lump, unspecified site: Secondary | ICD-10-CM

## 2020-10-29 NOTE — Progress Notes (Signed)
receievd report of US liver minimal fluid noted will send pt to GI for fu on ascites? Abdominal ascites

## 2020-10-30 ENCOUNTER — Encounter: Payer: Self-pay | Admitting: *Deleted

## 2020-11-01 ENCOUNTER — Other Ambulatory Visit: Payer: Self-pay

## 2020-11-01 ENCOUNTER — Ambulatory Visit (INDEPENDENT_AMBULATORY_CARE_PROVIDER_SITE_OTHER): Payer: Medicare Other | Admitting: Internal Medicine

## 2020-11-01 ENCOUNTER — Telehealth: Payer: Self-pay | Admitting: Pulmonary Disease

## 2020-11-01 ENCOUNTER — Encounter: Payer: Self-pay | Admitting: Internal Medicine

## 2020-11-01 VITALS — BP 163/92 | HR 101 | Temp 98.5°F | Ht 65.75 in | Wt 217.4 lb

## 2020-11-01 DIAGNOSIS — R059 Cough, unspecified: Secondary | ICD-10-CM | POA: Diagnosis not present

## 2020-11-01 MED ORDER — AMOXICILLIN-POT CLAVULANATE 875-125 MG PO TABS: 1.0000 | ORAL_TABLET | Freq: Two times a day (BID) | ORAL | 0 refills | Status: DC

## 2020-11-01 MED ORDER — HYDROCOD POLST-CPM POLST ER 10-8 MG/5ML PO SUER: 5.0000 mL | Freq: Two times a day (BID) | ORAL | 0 refills | Status: DC | PRN

## 2020-11-01 NOTE — Progress Notes (Signed)
BP (!) 163/92   Pulse (!) 101   Temp 98.5 F (36.9 C) (Oral)   Ht 5' 5.75" (1.67 m)   Wt 217 lb 6.4 oz (98.6 kg)   SpO2 96%   BMI 35.36 kg/m    Subjective:    Patient ID: Mike Kline, male    DOB: [birth year], 50 y.o.   MRN: [id number]  HPI: Mike Kline is a 50 y.o. male  Cough This is a chronic problem. Pertinent negatives include no chest pain or weight loss.  Diabetes He presents for his follow-up diabetic visit. He has type 1 diabetes mellitus. His disease course has been worsening. Pertinent negatives for diabetes include no blurred vision, no chest pain, no fatigue, no foot paresthesias, no foot ulcerations, no polydipsia, no polyphagia, no polyuria, no visual change, no weakness and no weight loss.    Chief Complaint  Patient presents with  . Cough    Patient has had cough since he had covid.    Relevant past medical, surgical, family and social history reviewed and updated as indicated. Interim medical history since our last visit reviewed. Allergies and medications reviewed and updated.  Review of Systems  Constitutional: Negative for fatigue and weight loss.  Eyes: Negative for blurred vision.  Respiratory: Positive for cough.   Cardiovascular: Negative for chest pain.  Endocrine: Negative for polydipsia, polyphagia and polyuria.  Neurological: Negative for weakness.    Per HPI unless specifically indicated above     Objective:    BP (!) 163/92   Pulse (!) 101   Temp 98.5 F (36.9 C) (Oral)   Ht 5' 5.75" (1.67 m)   Wt 217 lb 6.4 oz (98.6 kg)   SpO2 96%   BMI 35.36 kg/m   Wt Readings from Last 3 Encounters:  11/01/20 217 lb 6.4 oz (98.6 kg)  10/22/20 208 lb (94.3 kg)  10/17/20 223 lb (101.2 kg)    Physical Exam Vitals and nursing note reviewed.  Constitutional:      General: He is not in acute distress.    Appearance: Normal appearance. He is not ill-appearing or diaphoretic.  HENT:     Head: Normocephalic and atraumatic.     Right Ear:  Tympanic membrane and external ear normal. There is no impacted cerumen.     Left Ear: External ear normal.     Nose: No congestion or rhinorrhea.     Mouth/Throat:     Pharynx: No oropharyngeal exudate or posterior oropharyngeal erythema.  Eyes:     Conjunctiva/sclera: Conjunctivae normal.     Pupils: Pupils are equal, round, and reactive to light.  Cardiovascular:     Rate and Rhythm: Normal rate and regular rhythm.     Heart sounds: No murmur heard. No friction rub. No gallop.   Pulmonary:     Effort: No respiratory distress.     Breath sounds: No stridor. No wheezing or rhonchi.  Chest:     Chest wall: No tenderness.  Abdominal:     General: Abdomen is flat. Bowel sounds are normal.     Palpations: Abdomen is soft. There is no mass.     Tenderness: There is no abdominal tenderness.  Musculoskeletal:     Cervical back: Normal range of motion and neck supple. No rigidity or tenderness.     Left lower leg: No edema.  Skin:    General: Skin is warm and dry.  Neurological:     Mental Status: He is alert.  Results for orders placed or performed in visit on 10/23/20  Microalbumin, Urine Waived  Result Value Ref Range   Microalb, Ur Waived 80 (H) 0 - 19 mg/L   Creatinine, Urine Waived 100 10 - 300 mg/dL   Microalb/Creat Ratio 30-300 (H) <30 mg/g  Hemoglobin A1c  Result Value Ref Range   Hgb A1c MFr Bld 10.5 (H) 4.8 - 5.6 %   Est. average glucose Bld gHb Est-mCnc 255 mg/dL  Uric acid  Result Value Ref Range   Uric Acid 5.6 3.8 - 8.4 mg/dL  ANA  Result Value Ref Range   Anti Nuclear Antibody (ANA) Negative Negative  Rheumatoid factor  Result Value Ref Range   Rhuematoid fact SerPl-aCnc 10.5 <14.0 IU/mL  Comprehensive metabolic panel  Result Value Ref Range   Glucose 229 (H) 65 - 99 mg/dL   BUN 19 6 - 24 mg/dL   Creatinine, Ser 1.28 (H) 0.76 - 1.27 mg/dL   eGFR 68 >59 mL/min/1.73   BUN/Creatinine Ratio 15 9 - 20   Sodium 135 134 - 144 mmol/L   Potassium 4.4 3.5 -  5.2 mmol/L   Chloride 97 96 - 106 mmol/L   CO2 21 20 - 29 mmol/L   Calcium 8.9 8.7 - 10.2 mg/dL   Total Protein 6.9 6.0 - 8.5 g/dL   Albumin 4.0 3.8 - 4.9 g/dL   Globulin, Total 2.9 1.5 - 4.5 g/dL   Albumin/Globulin Ratio 1.4 1.2 - 2.2   Bilirubin Total 0.2 0.0 - 1.2 mg/dL   Alkaline Phosphatase 122 (H) 44 - 121 IU/L   AST 37 0 - 40 IU/L   ALT 23 0 - 44 IU/L  CBC with Differential/Platelet  Result Value Ref Range   WBC 5.2 3.4 - 10.8 x10E3/uL   RBC 5.05 4.14 - 5.80 x10E6/uL   Hemoglobin 12.2 (L) 13.0 - 17.7 g/dL   Hematocrit 38.4 37.5 - 51.0 %   MCV 76 (L) 79 - 97 fL   MCH 24.2 (L) 26.6 - 33.0 pg   MCHC 31.8 31.5 - 35.7 g/dL   RDW 14.2 11.6 - 15.4 %   Platelets 264 150 - 450 x10E3/uL   Neutrophils 53 Not Estab. %   Lymphs 27 Not Estab. %   Monocytes 12 Not Estab. %   Eos 6 Not Estab. %   Basos 1 Not Estab. %   Neutrophils Absolute 2.7 1.4 - 7.0 x10E3/uL   Lymphocytes Absolute 1.4 0.7 - 3.1 x10E3/uL   Monocytes Absolute 0.6 0.1 - 0.9 x10E3/uL   EOS (ABSOLUTE) 0.3 0.0 - 0.4 x10E3/uL   Basophils Absolute 0.0 0.0 - 0.2 x10E3/uL   Immature Granulocytes 1 Not Estab. %   Immature Grans (Abs) 0.1 0.0 - 0.1 x10E3/uL  Lipid panel  Result Value Ref Range   Cholesterol, Total 109 100 - 199 mg/dL   Triglycerides 126 0 - 149 mg/dL   HDL 38 (L) >39 mg/dL   VLDL Cholesterol Cal 23 5 - 40 mg/dL   LDL Chol Calc (NIH) 48 0 - 99 mg/dL   Chol/HDL Ratio 2.9 0.0 - 5.0 ratio  PSA  Result Value Ref Range   Prostate Specific Ag, Serum 0.2 0.0 - 4.0 ng/mL  TSH  Result Value Ref Range   TSH 2.320 0.450 - 4.500 uIU/mL        Current Outpatient Medications:  .  amitriptyline (ELAVIL) 100 MG tablet, Take 1 tablet (100 mg total) by mouth at bedtime., Disp: 30 tablet, Rfl: 3 .  azithromycin (ZITHROMAX)  250 MG tablet, Take 1 tablet (250 mg total) by mouth daily., Disp: 4 each, Rfl: 0 .  chlorpheniramine-HYDROcodone (TUSSIONEX PENNKINETIC ER) 10-8 MG/5ML SUER, Take 5 mLs by mouth every 12  (twelve) hours as needed for cough., Disp: 60 mL, Rfl: 0 .  famotidine (PEPCID) 20 MG tablet, Take 1 tablet (20 mg total) by mouth at bedtime., Disp: 30 tablet, Rfl: 1 .  gabapentin (NEURONTIN) 300 MG capsule, Take 1 capsule (300 mg total) by mouth 3 (three) times daily., Disp: 270 capsule, Rfl: 2 .  HUMALOG KWIKPEN 100 UNIT/ML KiwkPen, USE THREE TIMES A DAY AS DIRECTED.(UP TO 50 UNITS A DAY OR AS DIRECTED BY SLIDING SCALE) (Patient taking differently: Inject 6-10 Units into the skin 3 (three) times daily.), Disp: 15 mL, Rfl: 6 .  insulin glargine (LANTUS SOLOSTAR) 100 UNIT/ML Solostar Pen, Inject 40 Units into the skin daily at 10 pm., Disp: , Rfl:  .  losartan (COZAAR) 25 MG tablet, Take 1 tablet (25 mg total) by mouth daily., Disp: 90 tablet, Rfl: 1 .  metoprolol tartrate (LOPRESSOR) 25 MG tablet, Take 1 tablet (25 mg total) by mouth 2 (two) times daily., Disp: 90 tablet, Rfl: 1 .  pantoprazole (PROTONIX) 40 MG tablet, Take 1 tablet (40 mg total) by mouth daily., Disp: 90 tablet, Rfl: 1 .  pramipexole (MIRAPEX) 0.125 MG tablet, Take 1 tablet (0.125 mg total) by mouth every evening., Disp: 90 tablet, Rfl: 0 .  rosuvastatin (CRESTOR) 40 MG tablet, Take 1 tablet (40 mg total) by mouth daily., Disp: 90 tablet, Rfl: 2 .  sildenafil (VIAGRA) 50 MG tablet, Take 50 mg by mouth daily as needed., Disp: , Rfl:  .  tadalafil (CIALIS) 10 MG tablet, Take 10 mg by mouth daily as needed., Disp: , Rfl:     Assessment & Plan:  1. Dm: fu and mx per endocrinology  check HbA1c,  urine  microalbumin  diabetic diet plan given to pt  adviced regarding hypoglycemia and instructions given to pt today on how to prevent and treat the same if it were to occur. pt acknowledges the plan and voices understanding of the same.  exercise plan given and encouraged.   advice diabetic yearly podiatry, ophthalmology , nutritionist , dental check q 6 months,    Ref. Range 10/23/2020 08:55  Glucose Latest Ref Range: 65 - 99 mg/dL 229  (H)  Hemoglobin A1C Latest Ref Range: 4.8 - 5.6 % 10.5 (H)  Est. average glucose Bld gHb Est-mCnc Latest Units: mg/dL 255  TSH Latest Ref Range: 0.450 - 4.500 uIU/mL 2.320    2.  Chronic cough will refer to pulm for such ? Needs a CT chest  Has a post covid cough. Persistent.    Problem List Items Addressed This Visit   None      Follow up plan: No follow-ups on file.

## 2020-11-01 NOTE — Telephone Encounter (Signed)
Tussionex sent to his pharmacy

## 2020-11-01 NOTE — Telephone Encounter (Signed)
May appointment is entirely fine.  He has had negative chest x-rays and he has had cough since he had COVID in October 2021.  This is not a new symptom.  Dr. Belia Heman had seen him previously in the hospital.

## 2020-11-01 NOTE — Telephone Encounter (Signed)
Melissa, please schedule for May. Thank you!

## 2020-11-01 NOTE — Telephone Encounter (Signed)
Received urgent referral for cough.  Dr. Jayme Cloud please review chart and determine if May appt is okay? Thanks

## 2020-11-01 NOTE — Telephone Encounter (Signed)
Sorry, I didn't realize he was seen today- I am happy to send Rx- but please reach out to Dr. Charlotta Newton and see if she is OK with tussionex being sent and if it was discussed

## 2020-11-01 NOTE — Telephone Encounter (Signed)
Called and scheduled pt on 12/11/20 @ 1:30 with Dr.Kasa. Nothing further needed

## 2020-11-07 ENCOUNTER — Telehealth: Payer: Self-pay | Admitting: Pulmonary Disease

## 2020-11-07 ENCOUNTER — Encounter: Payer: Self-pay | Admitting: Pulmonary Disease

## 2020-11-07 ENCOUNTER — Ambulatory Visit: Payer: Medicare Other | Admitting: Pulmonary Disease

## 2020-11-07 ENCOUNTER — Other Ambulatory Visit: Payer: Self-pay

## 2020-11-07 VITALS — BP 138/78 | HR 95 | Temp 97.7°F | Ht 67.0 in | Wt 215.2 lb

## 2020-11-07 DIAGNOSIS — E1065 Type 1 diabetes mellitus with hyperglycemia: Secondary | ICD-10-CM

## 2020-11-07 DIAGNOSIS — R059 Cough, unspecified: Secondary | ICD-10-CM | POA: Diagnosis not present

## 2020-11-07 DIAGNOSIS — L4050 Arthropathic psoriasis, unspecified: Secondary | ICD-10-CM

## 2020-11-07 DIAGNOSIS — K219 Gastro-esophageal reflux disease without esophagitis: Secondary | ICD-10-CM | POA: Diagnosis not present

## 2020-11-07 DIAGNOSIS — IMO0002 Reserved for concepts with insufficient information to code with codable children: Secondary | ICD-10-CM

## 2020-11-07 DIAGNOSIS — E108 Type 1 diabetes mellitus with unspecified complications: Secondary | ICD-10-CM

## 2020-11-07 MED ORDER — PREDNISONE 20 MG PO TABS: 20.0000 mg | ORAL_TABLET | Freq: Every day | ORAL | 0 refills | Status: AC

## 2020-11-07 MED ORDER — BREO ELLIPTA 200-25 MCG/INH IN AEPB: 1.0000 | INHALATION_SPRAY | Freq: Every day | RESPIRATORY_TRACT | 0 refills | Status: AC

## 2020-11-07 MED ORDER — BREO ELLIPTA 200-25 MCG/INH IN AEPB: 1.0000 | INHALATION_SPRAY | Freq: Every day | RESPIRATORY_TRACT | 2 refills | Status: DC

## 2020-11-07 MED ORDER — CLARITHROMYCIN 500 MG PO TABS: 500.0000 mg | ORAL_TABLET | Freq: Two times a day (BID) | ORAL | 0 refills | Status: AC

## 2020-11-07 MED ORDER — HYDROCOD POLST-CPM POLST ER 10-8 MG/5ML PO SUER: 5.0000 mL | Freq: Every evening | ORAL | 0 refills | Status: DC | PRN

## 2020-11-07 MED ORDER — ALBUTEROL SULFATE HFA 108 (90 BASE) MCG/ACT IN AERS: 2.0000 | INHALATION_SPRAY | Freq: Four times a day (QID) | RESPIRATORY_TRACT | 2 refills | Status: DC | PRN

## 2020-11-07 NOTE — Patient Instructions (Signed)
STOP THE ANTIBIOTIC (AUGMENTIN/AMOXICILLIN)  We are starting you on Biaxin 1 tablet twice a day  We are checking a CT chest  We are checking you for pertussis  You are going to get prednisone for 5 days  We are starting you on an inhaler called Breo Ellipta 1 inhalation daily make sure you rinse your mouth well after you use it.  You may use a little baking soda in the water to clear the powder in your mouth afterwards.  We are also giving you an inhaler called albuterol (ProAir, or Ventolin) which can be used when you have shortness of breath or cough.  I have given you a limited amount of Tussionex that you can take at nighttime to help you sleep  We will see you in follow-up in 2 to 3 weeks time with either me or the nurse practitioner.  I believe the changes in your nails and the joint changes you have may be related to psoriatic arthritis it may be reasonable for your primary care doctor to refer you to a rheumatologist

## 2020-11-07 NOTE — Progress Notes (Signed)
Subjective:    Patient ID: Mike Kline, male    DOB: Jan 09, 1971, 50 y.o.   MRN: [id number]  HPI Patient is a 50 year old lifelong never smoker who presents for evaluation of a severe cough which started approximately 2 months ago.  He is kindly referred by Dr. Loura Pardon.  The cough is productive of sputum however, the patient does not know what color because he "swallows it".  Previously his cough has been related to being due to a hiatal hernia and GERD however, he is on famotidine and PPI with persistent symptoms of cough.  He has not had any gastroesophageal reflux symptoms lately.  He was diagnosed with COVID-19 on 04 June 2020 and had to be admitted to [facility] on 13 June 2020 due to altered mental status and acute respiratory failure with hypoxia due to COVID-19.  Patient had noted acute lung injury/ARDS pattern on chest x-ray from the same.  He did require BiPAP and subsequently was able to be weaned down to 2 L/min nasal cannula O2 and discharged home.  He eventually recovered from this but around the first part of February started having a dry cough symptoms of upper respiratory infection.  COVID-19 test was negative at that time.  He had no fevers, chills or sweats.  Because of the severity of the cough he had to be evaluated at  the emergency room on 15 March .  He was treated with Azithromycin at that time which failed to improve his symptoms.  He also was treated subsequently with Augmentin which also fail to improve his symptoms.  He has not been treated with steroids.  He does endorse joint pain and stiffness of the joints particularly in the mornings.  He said recently he was given some Tussionex which helped him at least get some night sleep.  He does not use it during the day.  Review of Systems A 10 point review of systems was performed and it is as noted above otherwise negative.  Past Medical History:  Diagnosis Date  . 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 Anginal pain (HCC)   . Blindness of left eye   . Diabetes mellitus without complication (HCC)   . Diabetic, retinopathy, proliferative (HCC)   . GERD (gastroesophageal reflux disease)   . Hyperlipidemia   . Hypertension    Past Surgical History:  Procedure Laterality Date  . APPENDECTOMY    . CABG x4  06/02/2019   [place]  . CARPAL TUNNEL RELEASE Bilateral   . cataract surgery Bilateral 2014  . EYE SURGERY Left   . LAA clip Left 06/02/2019   [place]  . LEFT HEART CATH AND CORONARY ANGIOGRAPHY Left 05/22/2019   Procedure: LEFT HEART CATH AND CORONARY ANGIOGRAPHY;  Surgeon: Alwyn Pea, MD;  Location: [facility] INVASIVE CV LAB;  Service: Cardiovascular;  Laterality: Left;  . SHOULDER SURGERY Left 2009   Family History  Problem Relation Age of Onset  . Bone cancer Mother     Social History   Tobacco Use  . Smoking status: Never Smoker  . Smokeless tobacco: Never Used  . Tobacco comment: Never  Substance Use Topics  . Alcohol use: Not Currently    Comment: socially   No Known Allergies  Current Meds  Medication Sig  . amitriptyline (ELAVIL) 100 MG tablet Take 1 tablet (100 mg total) by mouth at bedtime.  . chlorpheniramine-HYDROcodone (TUSSIONEX PENNKINETIC ER) 10-8 MG/5ML SUER Take 5 mLs by  mouth at bedtime as needed.  . famotidine (PEPCID) 20 MG tablet Take 1 tablet (20 mg total) by mouth at bedtime.  . gabapentin (NEURONTIN) 300 MG capsule Take 1 capsule (300 mg total) by mouth 3 (three) times daily.  Marland Kitchen HUMALOG KWIKPEN 100 UNIT/ML KiwkPen USE THREE TIMES A DAY AS DIRECTED.(UP TO 50 UNITS A DAY OR AS DIRECTED BY SLIDING SCALE) (Patient taking differently: Inject 6-10 Units into the skin 3 (three) times daily.)  . insulin glargine (LANTUS SOLOSTAR) 100 UNIT/ML Solostar Pen Inject 40 Units into the skin daily at 10 pm.  . losartan (COZAAR) 25 MG tablet Take 1 tablet (25 mg total) by mouth daily.  . metoprolol tartrate (LOPRESSOR) 25 MG tablet Take  1 tablet (25 mg total) by mouth 2 (two) times daily.  . pantoprazole (PROTONIX) 40 MG tablet Take 1 tablet (40 mg total) by mouth daily.  . pramipexole (MIRAPEX) 0.125 MG tablet Take 1 tablet (0.125 mg total) by mouth every evening.  . rosuvastatin (CRESTOR) 40 MG tablet Take 1 tablet (40 mg total) by mouth daily.  . sildenafil (VIAGRA) 50 MG tablet Take 50 mg by mouth daily as needed.  . [DISCONTINUED] amoxicillin-clavulanate (AUGMENTIN) 875-125 MG tablet Take 1 tablet by mouth 2 (two) times daily.  . [DISCONTINUED] chlorpheniramine-HYDROcodone (TUSSIONEX PENNKINETIC ER) 10-8 MG/5ML SUER Take 5 mLs by mouth every 12 (twelve) hours as needed.   Immunization History  Administered Date(s) Administered  . Influenza,inj,Quad PF,6+ Mos 05/28/2015, 04/14/2019  . Influenza-Unspecified 05/15/2014, 06/14/2020  . PFIZER(Purple Top)SARS-COV-2 Vaccination 09/17/2020  . Pneumococcal Conjugate-13 10/17/2020  . Pneumococcal-Unspecified 03/05/2010  . Td 09/20/2008      Objective:   Physical Exam BP 138/78 (BP Location: Left Arm, Patient Position: Sitting, Cuff Size: Normal)   Pulse 95   Temp 97.7 F (36.5 C) (Temporal)   Ht 5\' 7"  (1.702 m)   Wt 215 lb 3.2 oz (97.6 kg)   SpO2 98%   BMI 33.71 kg/m   GENERAL: Well-developed, somewhat overweight male, no acute distress.  Coughing intermittently cough sounds productive. HEAD: Normocephalic, atraumatic.  EYES: Pupils equal, round, reactive to light.  No scleral icterus.  MOUTH: Nose/mouth/throat not examined due to masking requirements for COVID 19. NECK: Supple. No thyromegaly. Trachea midline. No JVD.  No adenopathy. PULMONARY: Good air entry bilaterally.  Coarse breath sounds with no other adventitious sounds. CARDIOVASCULAR: S1 and S2. Regular rate and rhythm.  No rubs, murmurs or gallops heard. ABDOMEN: Benign. MUSCULOSKELETAL: No joint deformity, no clubbing, no edema.  NEUROLOGIC: No focal deficit, no gait disturbance, speech is  fluent. SKIN: Intact,warm,dry.  Pitted thickened nails, chronic arthritic changes both hands. PSYCH: Anxious appearing, behavior normal.  Chest x-ray performed 22 October 2020, independently reviewed: He appears to have some coarse interstitial markings compared to baseline chest x-ray.         Assessment & Plan:     ICD-10-CM   1. 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 Gastroesophageal reflux disease, unspecified whether esophagitis present  K21.9    Continue PPI and H2 blocker This may be contributing to his cough issues  3. Type I diabetes mellitus with manifestations, uncontrolled (HCC)  E10.8    E10.65    This issue adds complexity to his management Limits use of corticosteroids Managed by primary care  4. Psoriatic arthritis (HCC) - query  L40.50  Psoriatic nail changes Joint changes consistent with inflammatory arthropathy   Orders Placed This Encounter  Procedures  . Bordetella pertussis PCR    Standing Status:   Future    Standing Expiration Date:   11/07/2021  . CT CHEST W CONTRAST    Standing Status:   Future    Standing Expiration Date:   11/07/2021    Scheduling Instructions:     Within one week.    Order Specific Question:   If indicated for the ordered procedure, I authorize the administration of contrast media per Radiology protocol    Answer:   Yes    Order Specific Question:   Preferred imaging location?    Answer:   [facility]   Meds ordered this encounter  Medications  . fluticasone furoate-vilanterol (BREO ELLIPTA) 200-25 MCG/INH AEPB    Sig: Inhale 1 puff into the lungs daily for 1 day.    Dispense:  14 each    Refill:  0    Order Specific Question:   Lot Number?    Answer:   Aline [id number]    Order Specific Question:   Expiration Date?    Answer:   04/10/2021    Order Specific  Question:   Manufacturer?    Answer:   [employer] [[id number]]    Order Specific Question:   Quantity    Answer:   1  . clarithromycin (BIAXIN) 500 MG tablet    Sig: Take 1 tablet (500 mg total) by mouth 2 (two) times daily for 10 days.    Dispense:  20 tablet    Refill:  0  . predniSONE (DELTASONE) 20 MG tablet    Sig: Take 1 tablet (20 mg total) by mouth daily with breakfast for 5 days.    Dispense:  5 tablet    Refill:  0  . fluticasone furoate-vilanterol (BREO ELLIPTA) 200-25 MCG/INH AEPB    Sig: Inhale 1 puff into the lungs daily.    Dispense:  28 each    Refill:  2  . albuterol (VENTOLIN HFA) 108 (90 Base) MCG/ACT inhaler    Sig: Inhale 2 puffs into the lungs every 6 (six) hours as needed for wheezing or shortness of breath (Cough).    Dispense:  8 g    Refill:  2  . chlorpheniramine-HYDROcodone (TUSSIONEX PENNKINETIC ER) 10-8 MG/5ML SUER    Sig: Take 5 mLs by mouth at bedtime as needed.    Dispense:  115 mL    Refill:  0    Discussion:  Patient appears to have likely a postinfectious cough though this is difficult to determine given his apparent "cycling" of the symptoms since his COVID diagnosis in October 2021.  He has issues with reflux however these have been well controlled on famotidine and PPI.  He states that he has a hiatal hernia that he would like to get "fixed" however I do not see evidence of a large hiatal hernia on his available imaging.  Other possibilities could be interstitial lung disease associated with inflammatory arthropathy.  He has evidence of potential psoriatic arthritis.  He has a typical psoriasis nail changes though he does not have a rash or psoriatic plaques however psoriatic arthritis can still occur without skin manifestations.  He does have evidence of inflammatory arthropathy on his hand joints.  With regards to his cough will investigate with CT scan of the chest and pulmonary function testing and check pertussis PCR.  With regards to treatment we  will give him empiric treatment  with Biaxin and low-dose prednisone.  Will also start patient on Breo Ellipta 200/25 1 inhalation daily with as needed albuterol.  He requested a refill on Tussionex.  I did give him 1 refill indicating that we will not refill any further as we do not want to mask his symptoms to make sure that we address the issues without masking.  He may benefit from rheumatologic evaluation in the future particularly with regards to psoriatic arthritis.  Note that patients with psoriatic arthritis are prone to have issues with asthma and ILD.  Him in follow-up in 2 to 3 weeks time he is to contact us prior to that time should any new difficulties arise.  Gailen Shelter, MD [facility]   *This note was dictated using voice recognition software/Dragon.  Despite best efforts to proofread, errors can occur which can change the meaning.  Any change was purely unintentional.

## 2020-11-07 NOTE — Telephone Encounter (Signed)
Yes, please.

## 2020-11-08 ENCOUNTER — Encounter: Payer: Self-pay | Admitting: Pulmonary Disease

## 2020-11-08 DIAGNOSIS — I700 Atherosclerosis of aorta: Secondary | ICD-10-CM | POA: Insufficient documentation

## 2020-11-12 ENCOUNTER — Other Ambulatory Visit: Payer: Self-pay

## 2020-11-12 ENCOUNTER — Telehealth (INDEPENDENT_AMBULATORY_CARE_PROVIDER_SITE_OTHER): Payer: Medicare Other | Admitting: Internal Medicine

## 2020-11-12 ENCOUNTER — Encounter: Payer: Self-pay | Admitting: Internal Medicine

## 2020-11-12 DIAGNOSIS — M25561 Pain in right knee: Secondary | ICD-10-CM | POA: Diagnosis not present

## 2020-11-12 DIAGNOSIS — M25562 Pain in left knee: Secondary | ICD-10-CM

## 2020-11-12 DIAGNOSIS — G8929 Other chronic pain: Secondary | ICD-10-CM | POA: Diagnosis not present

## 2020-11-12 MED ORDER — FEXOFENADINE HCL 180 MG PO TABS: 180.0000 mg | ORAL_TABLET | Freq: Every day | ORAL | 1 refills | Status: DC

## 2020-11-12 MED ORDER — MONTELUKAST SODIUM 10 MG PO TABS: 10.0000 mg | ORAL_TABLET | Freq: Every day | ORAL | 3 refills | Status: DC

## 2020-11-12 NOTE — Progress Notes (Signed)
There were no vitals taken for this visit.   Subjective:    Patient ID: Mike Kline, male    DOB: 1971-08-10, 50 y.o.   MRN: [id number]  HPI: Mike Kline is a 50 y.o. male  Patient is here for a virtual visit for chronic cough. He has had Covid and has had this cough ever since then. To pulmonology however does not have an appointment yet as he was not able to call them.   I connected with Mike Kline on 11/13/20 by a video enabled telemedicine application and verified that I am speaking with the correct person using two identifiers.  I discussed the limitations of evaluation and management by telemedicine. The patient expressed understanding and agreed to proceed. "I discussed the limitations of evaluation and management by telemedicine and the availability of in person appointments. The patient expressed understanding and agreed to proceed"      Cough This is a recurrent problem. The current episode started 1 to 4 weeks ago.  Diabetes He presents for his follow-up (She has type I diabetic  he is very certain that he is not taking his insulin the weight as directed will need to follow-up A1c very high at 10.7 is only on insulin given type I) diabetic visit. He has type 1 diabetes mellitus.    Chief Complaint  Patient presents with  . Cough    Patient states that cough is better but still coughing    Relevant past medical, surgical, family and social history reviewed and updated as indicated. Interim medical history since our last visit reviewed. Allergies and medications reviewed and updated.  Review of Systems  Respiratory: Positive for cough.     Per HPI unless specifically indicated above     Objective:    There were no vitals taken for this visit.  Wt Readings from Last 3 Encounters:  11/07/20 215 lb 3.2 oz (97.6 kg)  11/01/20 217 lb 6.4 oz (98.6 kg)  10/22/20 208 lb (94.3 kg)    Physical Exam Vitals reviewed: unable to perfom sec to virtual visit.      Results for orders placed or performed in visit on 10/23/20  Microalbumin, Urine Waived  Result Value Ref Range   Microalb, Ur Waived 80 (H) 0 - 19 mg/L   Creatinine, Urine Waived 100 10 - 300 mg/dL   Microalb/Creat Ratio 30-300 (H) <30 mg/g  Hemoglobin A1c  Result Value Ref Range   Hgb A1c MFr Bld 10.5 (H) 4.8 - 5.6 %   Est. average glucose Bld gHb Est-mCnc 255 mg/dL  Uric acid  Result Value Ref Range   Uric Acid 5.6 3.8 - 8.4 mg/dL  ANA  Result Value Ref Range   Anti Nuclear Antibody (ANA) Negative Negative  Rheumatoid factor  Result Value Ref Range   Rhuematoid fact SerPl-aCnc 10.5 <14.0 IU/mL  Comprehensive metabolic panel  Result Value Ref Range   Glucose 229 (H) 65 - 99 mg/dL   BUN 19 6 - 24 mg/dL   Creatinine, Ser 1.28 (H) 0.76 - 1.27 mg/dL   eGFR 68 >59 mL/min/1.73   BUN/Creatinine Ratio 15 9 - 20   Sodium 135 134 - 144 mmol/L   Potassium 4.4 3.5 - 5.2 mmol/L   Chloride 97 96 - 106 mmol/L   CO2 21 20 - 29 mmol/L   Calcium 8.9 8.7 - 10.2 mg/dL   Total Protein 6.9 6.0 - 8.5 g/dL   Albumin 4.0 3.8 - 4.9 g/dL   Globulin,  Total 2.9 1.5 - 4.5 g/dL   Albumin/Globulin Ratio 1.4 1.2 - 2.2   Bilirubin Total 0.2 0.0 - 1.2 mg/dL   Alkaline Phosphatase 122 (H) 44 - 121 IU/L   AST 37 0 - 40 IU/L   ALT 23 0 - 44 IU/L  CBC with Differential/Platelet  Result Value Ref Range   WBC 5.2 3.4 - 10.8 x10E3/uL   RBC 5.05 4.14 - 5.80 x10E6/uL   Hemoglobin 12.2 (L) 13.0 - 17.7 g/dL   Hematocrit 38.4 37.5 - 51.0 %   MCV 76 (L) 79 - 97 fL   MCH 24.2 (L) 26.6 - 33.0 pg   MCHC 31.8 31.5 - 35.7 g/dL   RDW 14.2 11.6 - 15.4 %   Platelets 264 150 - 450 x10E3/uL   Neutrophils 53 Not Estab. %   Lymphs 27 Not Estab. %   Monocytes 12 Not Estab. %   Eos 6 Not Estab. %   Basos 1 Not Estab. %   Neutrophils Absolute 2.7 1.4 - 7.0 x10E3/uL   Lymphocytes Absolute 1.4 0.7 - 3.1 x10E3/uL   Monocytes Absolute 0.6 0.1 - 0.9 x10E3/uL   EOS (ABSOLUTE) 0.3 0.0 - 0.4 x10E3/uL   Basophils  Absolute 0.0 0.0 - 0.2 x10E3/uL   Immature Granulocytes 1 Not Estab. %   Immature Grans (Abs) 0.1 0.0 - 0.1 x10E3/uL  Lipid panel  Result Value Ref Range   Cholesterol, Total 109 100 - 199 mg/dL   Triglycerides 126 0 - 149 mg/dL   HDL 38 (L) >39 mg/dL   VLDL Cholesterol Cal 23 5 - 40 mg/dL   LDL Chol Calc (NIH) 48 0 - 99 mg/dL   Chol/HDL Ratio 2.9 0.0 - 5.0 ratio  PSA  Result Value Ref Range   Prostate Specific Ag, Serum 0.2 0.0 - 4.0 ng/mL  TSH  Result Value Ref Range   TSH 2.320 0.450 - 4.500 uIU/mL        Current Outpatient Medications:  .  albuterol (VENTOLIN HFA) 108 (90 Base) MCG/ACT inhaler, Inhale 2 puffs into the lungs every 6 (six) hours as needed for wheezing or shortness of breath (Cough)., Disp: 8 g, Rfl: 2 .  amitriptyline (ELAVIL) 100 MG tablet, Take 1 tablet (100 mg total) by mouth at bedtime., Disp: 30 tablet, Rfl: 3 .  chlorpheniramine-HYDROcodone (TUSSIONEX PENNKINETIC ER) 10-8 MG/5ML SUER, Take 5 mLs by mouth at bedtime as needed., Disp: 115 mL, Rfl: 0 .  clarithromycin (BIAXIN) 500 MG tablet, Take 1 tablet (500 mg total) by mouth 2 (two) times daily for 10 days., Disp: 20 tablet, Rfl: 0 .  famotidine (PEPCID) 20 MG tablet, Take 1 tablet (20 mg total) by mouth at bedtime., Disp: 30 tablet, Rfl: 1 .  fluticasone furoate-vilanterol (BREO ELLIPTA) 200-25 MCG/INH AEPB, Inhale 1 puff into the lungs daily., Disp: 28 each, Rfl: 2 .  gabapentin (NEURONTIN) 300 MG capsule, Take 1 capsule (300 mg total) by mouth 3 (three) times daily., Disp: 270 capsule, Rfl: 2 .  HUMALOG KWIKPEN 100 UNIT/ML KiwkPen, USE THREE TIMES A DAY AS DIRECTED.(UP TO 50 UNITS A DAY OR AS DIRECTED BY SLIDING SCALE) (Patient taking differently: Inject 6-10 Units into the skin 3 (three) times daily.), Disp: 15 mL, Rfl: 6 .  insulin glargine (LANTUS SOLOSTAR) 100 UNIT/ML Solostar Pen, Inject 40 Units into the skin daily at 10 pm., Disp: , Rfl:  .  losartan (COZAAR) 25 MG tablet, Take 1 tablet (25 mg total)  by mouth daily., Disp: 90 tablet, Rfl: 1 .  metoprolol tartrate (LOPRESSOR) 25 MG tablet, Take 1 tablet (25 mg total) by mouth 2 (two) times daily., Disp: 90 tablet, Rfl: 1 .  pantoprazole (PROTONIX) 40 MG tablet, Take 1 tablet (40 mg total) by mouth daily., Disp: 90 tablet, Rfl: 1 .  pramipexole (MIRAPEX) 0.125 MG tablet, Take 1 tablet (0.125 mg total) by mouth every evening., Disp: 90 tablet, Rfl: 0 .  predniSONE (DELTASONE) 20 MG tablet, Take 1 tablet (20 mg total) by mouth daily with breakfast for 5 days., Disp: 5 tablet, Rfl: 0 .  rosuvastatin (CRESTOR) 40 MG tablet, Take 1 tablet (40 mg total) by mouth daily., Disp: 90 tablet, Rfl: 2 .  sildenafil (VIAGRA) 50 MG tablet, Take 50 mg by mouth daily as needed., Disp: , Rfl:  .  tadalafil (CIALIS) 10 MG tablet, Take 10 mg by mouth daily as needed., Disp: , Rfl:     Assessment & Plan:  1. 2. Chronic bil knee pain /  Check xrays asap.  2. Chronic cough - to fu with pulm s/p abx.  S/p COVID Start pt on allegra x 1 month Takes albuterol prn Will start pt on singulair for such  To fu with pulm .   3. Dm is type 1 :  check HbA1c,  urine  microalbumin  diabetic diet plan given to pt  adviced regarding hypoglycemia and instructions given to pt today on how to prevent and treat the same if it were to occur. pt acknowledges the plan and voices understanding of the same.  exercise plan given and encouraged.   advice diabetic yearly podiatry, ophthalmology , nutritionist , dental check q 6 months.  4.Pt was referred to GI : US aBDOMEN :  Trace subhepatic free fluid.  Follow up plan: No follow-ups on file.

## 2020-11-28 ENCOUNTER — Ambulatory Visit
Admission: RE | Admit: 2020-11-28 | Discharge: 2020-11-28 | Disposition: A | Discharge status: Home / Self Care | Payer: Medicare Other | Source: Ambulatory Visit | Attending: Pulmonary Disease | Admitting: Pulmonary Disease

## 2020-11-28 ENCOUNTER — Other Ambulatory Visit: Payer: Self-pay

## 2020-11-28 DIAGNOSIS — I700 Atherosclerosis of aorta: Secondary | ICD-10-CM | POA: Diagnosis not present

## 2020-11-28 DIAGNOSIS — R059 Cough, unspecified: Secondary | ICD-10-CM | POA: Diagnosis not present

## 2020-11-28 DIAGNOSIS — R918 Other nonspecific abnormal finding of lung field: Secondary | ICD-10-CM | POA: Diagnosis not present

## 2020-11-28 LAB — POCT I-STAT CREATININE: Creatinine, Ser: 1.1 mg/dL (ref 0.61–1.24)

## 2020-11-28 MED ORDER — IOHEXOL 300 MG/ML  SOLN
75.0000 mL | Freq: Once | INTRAMUSCULAR | Status: AC | PRN
  Administered 2020-11-28: 75 mL via INTRAVENOUS

## 2020-11-29 ENCOUNTER — Encounter: Payer: Self-pay | Admitting: Internal Medicine

## 2020-12-02 ENCOUNTER — Encounter: Payer: Self-pay | Admitting: Internal Medicine

## 2020-12-02 NOTE — Telephone Encounter (Signed)
Received the following message from patient:   "I see that there is a follow up for me on May 2nd at 3:30 PM. I cannot do afternoon appointments because I have to pick up my son from school and I leave at 1:30 and am in the car rider line by 2:00 for them to let out at 2:30 and having to wait for the buses to leave before they load up.  Can this be rescheduled for an AM appointment?    Also, I have been taking all the new meds and honestly, it seems like the cough from whatever respiratory infection that may have been there has subsided, the rest of the coughing is directly related to eating and my hiatal hernia.  So far, Breo, the Allergy Meds, Albuterol, etc doesnt make a difference for any of this. Is there a reason to continue taking any of these or if a follow up is needed at this point.  I had another chest CT at [facility] and it seems like most of it is related to my prior heart surgery or possibly prior COVID infection for which I was hospitalized.  I think at this point, unless you see something different in the CT and need an appointment, it will need to be a morning appointment. Please let me know."  I have cancelled the visit for May 2nd.   Dr. Jayme Cloud, can you please advise? Thanks.

## 2020-12-03 NOTE — Telephone Encounter (Signed)
He likely had postinfectious cough after COVID.  If he feels that it has subsided that is okay.  He can try being off the [facility], if his cough returns then he should return to using the [place].  If he feels that he is getting better and the cough has subsided no need for further follow-up as his CT was good.  However if his cough returns off of the [place], he should make an appointment and yes a morning appointment is okay he just needs to specify that at the time of scheduling.

## 2020-12-09 ENCOUNTER — Ambulatory Visit: Payer: Medicare Other | Admitting: Pulmonary Disease

## 2020-12-11 ENCOUNTER — Institutional Professional Consult (permissible substitution): Payer: Medicare Other | Admitting: Internal Medicine

## 2021-01-09 ENCOUNTER — Other Ambulatory Visit: Payer: Self-pay | Admitting: Internal Medicine

## 2021-01-11 ENCOUNTER — Emergency Department
Admission: EM | Admit: 2021-01-11 | Discharge: 2021-01-11 | Disposition: A | Discharge status: Home / Self Care | Payer: Medicare Other | Attending: Emergency Medicine | Admitting: Emergency Medicine

## 2021-01-11 ENCOUNTER — Emergency Department: Payer: Medicare Other

## 2021-01-11 ENCOUNTER — Other Ambulatory Visit: Payer: Self-pay

## 2021-01-11 ENCOUNTER — Encounter: Payer: Self-pay | Admitting: Intensive Care

## 2021-01-11 DIAGNOSIS — K219 Gastro-esophageal reflux disease without esophagitis: Secondary | ICD-10-CM | POA: Diagnosis not present

## 2021-01-11 DIAGNOSIS — E1069 Type 1 diabetes mellitus with other specified complication: Secondary | ICD-10-CM | POA: Diagnosis not present

## 2021-01-11 DIAGNOSIS — Z951 Presence of aortocoronary bypass graft: Secondary | ICD-10-CM | POA: Insufficient documentation

## 2021-01-11 DIAGNOSIS — Z794 Long term (current) use of insulin: Secondary | ICD-10-CM | POA: Diagnosis not present

## 2021-01-11 DIAGNOSIS — Z20822 Contact with and (suspected) exposure to covid-19: Secondary | ICD-10-CM | POA: Insufficient documentation

## 2021-01-11 DIAGNOSIS — E1051 Type 1 diabetes mellitus with diabetic peripheral angiopathy without gangrene: Secondary | ICD-10-CM | POA: Diagnosis not present

## 2021-01-11 DIAGNOSIS — E785 Hyperlipidemia, unspecified: Secondary | ICD-10-CM | POA: Diagnosis not present

## 2021-01-11 DIAGNOSIS — E103599 Type 1 diabetes mellitus with proliferative diabetic retinopathy without macular edema, unspecified eye: Secondary | ICD-10-CM | POA: Diagnosis not present

## 2021-01-11 DIAGNOSIS — R1012 Left upper quadrant pain: Secondary | ICD-10-CM

## 2021-01-11 DIAGNOSIS — R059 Cough, unspecified: Secondary | ICD-10-CM | POA: Insufficient documentation

## 2021-01-11 DIAGNOSIS — Z79899 Other long term (current) drug therapy: Secondary | ICD-10-CM | POA: Diagnosis not present

## 2021-01-11 LAB — COMPREHENSIVE METABOLIC PANEL
ALT: 34 U/L (ref 0–44)
AST: 34 U/L (ref 15–41)
Albumin: 3.8 g/dL (ref 3.5–5.0)
Alkaline Phosphatase: 108 U/L (ref 38–126)
Anion gap: 8 (ref 5–15)
BUN: 17 mg/dL (ref 6–20)
CO2: 23 mmol/L (ref 22–32)
Calcium: 8.7 mg/dL — ABNORMAL LOW (ref 8.9–10.3)
Chloride: 103 mmol/L (ref 98–111)
Creatinine, Ser: 1.14 mg/dL (ref 0.61–1.24)
GFR, Estimated: >60 mL/min (ref >60)
Glucose, Bld: 232 mg/dL — ABNORMAL HIGH (ref 70–99)
Potassium: 4.5 mmol/L (ref 3.5–5.1)
Sodium: 134 mmol/L — ABNORMAL LOW (ref 135–145)
Total Bilirubin: 0.8 mg/dL (ref 0.3–1.2)
Total Protein: 7.1 g/dL (ref 6.5–8.1)

## 2021-01-11 LAB — CBC WITH DIFFERENTIAL/PLATELET
Abs Immature Granulocytes: 0.03 10*3/uL (ref 0.00–0.07)
Basophils Absolute: 0 10*3/uL (ref 0.0–0.1)
Basophils Relative: 1 %
Eosinophils Absolute: 0.1 10*3/uL (ref 0.0–0.5)
Eosinophils Relative: 3 %
HCT: 37.6 % — ABNORMAL LOW (ref 39.0–52.0)
Hemoglobin: 12.1 g/dL — ABNORMAL LOW (ref 13.0–17.0)
Immature Granulocytes: 1 %
Lymphocytes Relative: 21 %
Lymphs Abs: 0.9 10*3/uL (ref 0.7–4.0)
MCH: 24.5 pg — ABNORMAL LOW (ref 26.0–34.0)
MCHC: 32.2 g/dL (ref 30.0–36.0)
MCV: 76.1 fL — ABNORMAL LOW (ref 80.0–100.0)
Monocytes Absolute: 0.5 10*3/uL (ref 0.1–1.0)
Monocytes Relative: 12 %
Neutro Abs: 2.7 10*3/uL (ref 1.7–7.7)
Neutrophils Relative %: 62 %
Platelets: 198 10*3/uL (ref 150–400)
RBC: 4.94 MIL/uL (ref 4.22–5.81)
RDW: 14.9 % (ref 11.5–15.5)
WBC: 4.3 10*3/uL (ref 4.0–10.5)
nRBC: 0 % (ref 0.0–0.2)

## 2021-01-11 LAB — RESP PANEL BY RT-PCR (FLU A&B, COVID) ARPGX2
Influenza A by PCR: NEGATIVE
Influenza B by PCR: NEGATIVE
SARS Coronavirus 2 by RT PCR: NEGATIVE

## 2021-01-11 LAB — TROPONIN I (HIGH SENSITIVITY): Troponin I (High Sensitivity): 6 ng/L (ref <18)

## 2021-01-11 MED ORDER — PREDNISONE 20 MG PO TABS
40.0000 mg | ORAL_TABLET | Freq: Once | ORAL | Status: AC
  Administered 2021-01-11: 40 mg via ORAL
  Filled 2021-01-11: qty 2

## 2021-01-11 MED ORDER — GUAIFENESIN-CODEINE 100-10 MG/5ML PO SOLN
5.0000 mL | Freq: Once | ORAL | Status: AC
  Administered 2021-01-11: 5 mL via ORAL
  Filled 2021-01-11: qty 5

## 2021-01-11 MED ORDER — GUAIFENESIN-CODEINE 100-10 MG/5ML PO SOLN: 5.0000 mL | Freq: Every evening | ORAL | 0 refills | Status: DC | PRN

## 2021-01-11 MED ORDER — AZITHROMYCIN 500 MG PO TABS
500.0000 mg | ORAL_TABLET | Freq: Once | ORAL | Status: AC
  Administered 2021-01-11: 500 mg via ORAL
  Filled 2021-01-11: qty 1

## 2021-01-11 MED ORDER — AZITHROMYCIN 250 MG PO TABS: ORAL_TABLET | ORAL | 0 refills | Status: AC

## 2021-01-11 MED ORDER — PREDNISONE 10 MG PO TABS: 30.0000 mg | ORAL_TABLET | Freq: Every day | ORAL | 0 refills | Status: AC

## 2021-01-11 NOTE — ED Triage Notes (Signed)
Patient has cough X2 days. Recently around grandson who had flu

## 2021-01-11 NOTE — Discharge Instructions (Addendum)
Please take medications as prescribed. Return to ER with any worsening, otherwise follow up with primary care.

## 2021-01-12 NOTE — ED Provider Notes (Signed)
[facility] Emergency Department Provider Note  ____________________________________________   Event Date/Time   First MD Initiated Contact with Patient 01/11/21 1853     (approximate)  I have reviewed the triage vital signs and the nursing notes.   HISTORY  Chief Complaint Cough   HPI Mike Kline is a 50 y.o. male who reports to the emergency department for evaluation of a cough that is been present for the last 2 days.  He reports no fever, but does feel "winded", particularly more so with exertion.  He denies any frank shortness of breath.  He denies any chest pain.  He does endorse left upper quadrant pain that has been present for the last 2 days since the cough started.  He denies any pain anywhere else in the abdomen, reports normal bowel movements.  He has not had any nausea, vomiting, diarrhea or constipation.  He denies any other associated symptoms.  He does have a history of an episode of acute respiratory failure with COVID that required ICU admission in January.  He also is a known diabetic, has had a quadruple bypass.       Past Medical History:  Diagnosis Date  . Adhesive capsulitis    permanent disability 07/2010, left shoulder  . Anginal pain (HCC)   . Blindness of left eye   . Diabetes mellitus without complication (HCC)   . Diabetic, retinopathy, proliferative (HCC)   . GERD (gastroesophageal reflux disease)   . Hyperlipidemia   . Hypertension     Patient Active Problem List   Diagnosis Date Noted  . Blindness of left eye 07/02/2020  . Depression 07/02/2020  . Respiratory failure (HCC) 06/13/2020  . Upper respiratory tract infection 06/03/2020  . 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 Acute blood loss anemia 06/07/2019  . Acute postoperative pain 06/07/2019  . AKI (acute kidney injury) (HCC) 06/07/2019  . Hyponatremia 06/07/2019  . Ileus (HCC) 06/07/2019  . Physical deconditioning 06/07/2019  . PONV  (postoperative nausea and vomiting) 06/07/2019  . 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 Coronary atherosclerosis due to lipid rich plaque 05/23/2019  . Anxiety 04/14/2019  . Insomnia 05/28/2016  . Adhesive capsulitis 12/17/2015  . Hypercholesterolemia 10/22/2015  . Diabetic, retinopathy, proliferative (HCC) 05/28/2015  . Diabetes mellitus type 1 with peripheral artery disease (HCC) 05/28/2015  . Hypertension   . Type I diabetes mellitus with manifestations, uncontrolled (HCC)   . GERD (gastroesophageal reflux disease)   . Nuclear sclerotic cataract 07/05/2013  . Type 1 diabetes mellitus (HCC) 05/29/2013    Past Surgical History:  Procedure Laterality Date  . APPENDECTOMY    . CABG x4  06/02/2019   [place]  . CARPAL TUNNEL RELEASE Bilateral   . cataract surgery Bilateral 2014  . EYE SURGERY Left   . LAA clip Left 06/02/2019   [place]  . LEFT HEART CATH AND CORONARY ANGIOGRAPHY Left 05/22/2019   Procedure: LEFT HEART CATH AND CORONARY ANGIOGRAPHY;  Surgeon: Alwyn Pea, MD;  Location: [facility] INVASIVE CV LAB;  Service: Cardiovascular;  Laterality: Left;  . SHOULDER SURGERY Left 2009    Prior to Admission medications   Medication Sig Start Date End Date Taking? Authorizing Provider  azithromycin (ZITHROMAX Z-PAK) 250 MG tablet Take 2 tablets (500 mg) on  Day 1,  followed by 1 tablet (250 mg) once daily on Days 2 through 5. 01/11/21 01/16/21 Yes Emma Schupp, Ruben Gottron, PA  guaiFENesin-codeine 100-10 MG/5ML syrup Take 5 mLs by  mouth at bedtime as needed for cough. 01/11/21  Yes Lucy Chrisodgers, Tramaine Sauls J, PA  predniSONE (DELTASONE) 10 MG tablet Take 3 tablets (30 mg total) by mouth daily for 3 days. 01/11/21 01/14/21 Yes Tylie Golonka, Ruben Gottronaitlin J, PA  albuterol (VENTOLIN HFA) 108 (90 Base) MCG/ACT inhaler Inhale 2 puffs into the lungs every 6 (six) hours as needed for wheezing or shortness of breath (Cough). 11/07/20   Salena SanerGonzalez, Carmen L, MD  amitriptyline (ELAVIL) 100 MG tablet Take 1  tablet (100 mg total) by mouth at bedtime. 10/17/20   Vigg, Avanti, MD  famotidine (PEPCID) 20 MG tablet Take 1 tablet (20 mg total) by mouth at bedtime. 10/17/20   Vigg, Avanti, MD  fexofenadine (ALLEGRA ALLERGY) 180 MG tablet Take 1 tablet (180 mg total) by mouth daily. 11/12/20   Vigg, Avanti, MD  fluticasone furoate-vilanterol (BREO ELLIPTA) 200-25 MCG/INH AEPB Inhale 1 puff into the lungs daily. 11/07/20   Salena SanerGonzalez, Carmen L, MD  gabapentin (NEURONTIN) 300 MG capsule Take 1 capsule (300 mg total) by mouth 3 (three) times daily. 10/17/20   Vigg, Avanti, MD  HUMALOG KWIKPEN 100 UNIT/ML KiwkPen USE THREE TIMES A DAY AS DIRECTED.(UP TO 50 UNITS A DAY OR AS DIRECTED BY SLIDING SCALE) Patient taking differently: Inject 6-10 Units into the skin 3 (three) times daily. 12/30/15   Steele Sizerrissman, Mark A, MD  insulin glargine (LANTUS SOLOSTAR) 100 UNIT/ML Solostar Pen Inject 40 Units into the skin daily at 10 pm. 06/20/20   Darlin PriestlyLai, Tina, MD  losartan (COZAAR) 25 MG tablet Take 1 tablet (25 mg total) by mouth daily. 10/17/20   Vigg, Avanti, MD  metoprolol tartrate (LOPRESSOR) 25 MG tablet Take 1 tablet (25 mg total) by mouth 2 (two) times daily. 10/17/20   Vigg, Avanti, MD  montelukast (SINGULAIR) 10 MG tablet Take 1 tablet (10 mg total) by mouth at bedtime. 11/12/20   Vigg, Avanti, MD  pantoprazole (PROTONIX) 40 MG tablet Take 1 tablet (40 mg total) by mouth daily. 08/21/20   Cannady, Corrie DandyJolene T, NP  pramipexole (MIRAPEX) 0.125 MG tablet Take 1 tablet (0.125 mg total) by mouth every evening. 01/09/21   Vigg, Avanti, MD  rosuvastatin (CRESTOR) 40 MG tablet Take 1 tablet (40 mg total) by mouth daily. 10/17/20   Vigg, Avanti, MD  sildenafil (VIAGRA) 50 MG tablet Take 50 mg by mouth daily as needed. 09/18/20   [provider]  tadalafil (CIALIS) 10 MG tablet Take 10 mg by mouth daily as needed. 05/09/20 07/08/20  [provider]    Allergies Patient has no known allergies.  Family History  Problem Relation Age of  Onset  . Bone cancer Mother     Social History Social History   Tobacco Use  . Smoking status: Never Smoker  . Smokeless tobacco: Never Used  . Tobacco comment: Never  Vaping Use  . Vaping Use: Never used  Substance Use Topics  . Alcohol use: Not Currently    Comment: socially  . Drug use: No    Review of Systems Constitutional: No fever/chills Eyes: No visual changes. ENT: No sore throat. Cardiovascular: Denies chest pain. Respiratory: +cough, + "winded", Denies frank shortness of breath. Gastrointestinal: + LUQ abdominal pain.  No nausea, no vomiting.  No diarrhea.  No constipation. Genitourinary: Negative for dysuria. Musculoskeletal: Negative for back pain. Skin: Negative for rash. Neurological: Negative for headaches, focal weakness or numbness.   ____________________________________________   PHYSICAL EXAM:  VITAL SIGNS: ED Triage Vitals  Enc Vitals Group     BP  01/11/21 1818 (!) 153/78     Pulse Rate 01/11/21 1818 86     Resp 01/11/21 1818 16     Temp 01/11/21 1818 98.3 F (36.8 C)     Temp Source 01/11/21 1818 Oral     SpO2 01/11/21 1818 98 %     Weight 01/11/21 1819 214 lb (97.1 kg)     Height 01/11/21 1819 5\' 7"  (1.702 m)     Head Circumference --      Peak Flow --      Pain Score 01/11/21 1819 7     Pain Loc --      Pain Edu? --      Excl. in GC? --    Constitutional: Alert and oriented. Well appearing and in no acute distress. Eyes: Conjunctivae are normal. PERRL. EOMI. Head: Atraumatic. Nose: No congestion/rhinnorhea. Mouth/Throat: Mucous membranes are moist.  Oropharynx non-erythematous. Neck: No stridor.  Lymphatic: No cervical lymphadenopathy Cardiovascular: Normal rate, regular rhythm. Grossly normal heart sounds.  Good peripheral circulation. Respiratory: Productive cough noted throughout exam.  Normal respiratory effort.  No retractions. Lungs CTAB. Gastrointestinal: + LUQ tednerness to palpation, No further tenderness. BS x 4  quadrants.  No distention. No abdominal bruits. No CVA tenderness. Musculoskeletal: No lower extremity tenderness nor edema.  No joint effusions. Neurologic:  Normal speech and language. No gross focal neurologic deficits are appreciated. No gait instability. Skin:  Skin is warm, dry and intact. No rash noted. Psychiatric: Mood and affect are normal. Speech and behavior are normal.  ____________________________________________   LABS (all labs ordered are listed, but only abnormal results are displayed)  Labs Reviewed  COMPREHENSIVE METABOLIC PANEL - Abnormal; Notable for the following components:      Result Value   Sodium 134 (*)    Glucose, Bld 232 (*)    Calcium 8.7 (*)    All other components within normal limits  CBC WITH DIFFERENTIAL/PLATELET - Abnormal; Notable for the following components:   Hemoglobin 12.1 (*)    HCT 37.6 (*)    MCV 76.1 (*)    MCH 24.5 (*)    All other components within normal limits  RESP PANEL BY RT-PCR (FLU A&B, COVID) ARPGX2  TROPONIN I (HIGH SENSITIVITY)  TROPONIN I (HIGH SENSITIVITY)   ____________________________________________  EKG  Normal sinus rhythm with a rate of 89 beats a minute, no ST elevations or depressions.  There is evidence of incomplete left bundle branch block, consistent with prior EKGs available for review. ____________________________________________  RADIOLOGY   Official radiology report(s): DG Chest 1 View  Result Date: 01/11/2021 CLINICAL DATA:  Cough EXAM: CHEST  1 VIEW COMPARISON:  10/22/2020 FINDINGS: Pulmonary insufflation is stable and the lungs are symmetrically expanded. Asymmetric interstitial infiltrate within the left lung is stable in keeping with interstitial scarring. No superimposed focal pulmonary infiltrate. No pneumothorax or pleural effusion. Coronary artery bypass grafting has been performed. Cardiac size within normal limits. Left atrial clipping has been performed. Pulmonary vascularity is normal.  No acute bone abnormality. IMPRESSION: No active disease. Electronically Signed   By: 10/24/2020 MD   On: 01/11/2021 19:10   CT CHEST ABDOMEN PELVIS WO CONTRAST  Result Date: 01/11/2021 CLINICAL DATA:  Cough and left upper quadrant abdominal pain. EXAM: CT CHEST, ABDOMEN AND PELVIS WITHOUT CONTRAST TECHNIQUE: Multidetector CT imaging of the chest, abdomen and pelvis was performed following the standard protocol without IV contrast. COMPARISON:  None. FINDINGS: CT CHEST FINDINGS Cardiovascular: No significant vascular findings. Normal heart size. No  pericardial effusion. Mediastinum/Nodes: Patulous esophagus. No mediastinal or axillary lymphadenopathy. Lungs/Pleura: Lungs are clear. No pleural effusion or pneumothorax. Musculoskeletal: No chest wall mass or suspicious bone lesions identified. CT ABDOMEN PELVIS FINDINGS Hepatobiliary: No focal liver abnormality is seen. No gallstones, gallbladder wall thickening, or biliary dilatation. Pancreas: Unremarkable. No pancreatic ductal dilatation or surrounding inflammatory changes. Spleen: Normal in size without focal abnormality. Adrenals/Urinary Tract: Adrenal glands are unremarkable. Kidneys are normal, without renal calculi, focal lesion, or hydronephrosis. Bladder is unremarkable. Stomach/Bowel: Stomach is within normal limits. No evidence of bowel wall thickening, distention, or inflammatory changes. Vascular/Lymphatic: Aortic atherosclerosis. No enlarged abdominal or pelvic lymph nodes. Reproductive: Prostate is unremarkable. Other: No abdominal wall hernia or abnormality. No abdominopelvic ascites. Musculoskeletal: No acute or significant osseous findings. IMPRESSION: 1. No acute abnormality of the chest, abdomen or pelvis. 2. Patulous esophagus. Aortic Atherosclerosis (ICD10-I70.0). Electronically Signed   By: Deatra Robinson M.D.   On: 01/11/2021 21:28    ____________________________________________   INITIAL IMPRESSION / ASSESSMENT AND PLAN / ED  COURSE  As part of my medical decision making, I reviewed the following data within the electronic MEDICAL RECORD NUMBER Nursing notes reviewed and incorporated, Labs reviewed, Radiograph reviewed and Notes from prior ED visits        Patient is a 50 year old male who presents to the emergency department for evaluation of 2 days of cough with feeling "winded" with exertion though he denies any frank shortness of breath, chest pain, fever.  He does have associated left upper quadrant pain, but denies any nausea, vomiting or diarrhea.    In triage.In triage, patient had in triage patient has grossly normal vital signs.  Work-up included respiratory panel, CMP, CBC, troponin.  Other than elevated glucose, other findings are grossly within normal limits.  COVID and flu negative.  Troponin normal.  Chest x-ray was obtained and is negative for any acute pneumonia.  CT of the chest and abdomen pelvis without contrast was obtained given the patient's left upper quadrant pain was not performed with contrast secondary to international contrast shortage.  This was negative for any acute findings.  Given the patient's reported history of pulmonary fibrosis with the acute significant cough we will treat with steroid, Z-Pak.  I will give cough medicine with codeine to take at nighttime only.  He is going to use over-the-counter medications to control his cough during the day.  He declined an offer for [employer].  Patient is amenable with plan.  Return precautions were discussed at length in he stable time for outpatient follow-up.      ____________________________________________   FINAL CLINICAL IMPRESSION(S) / ED DIAGNOSES  Final diagnoses:  Cough  Left upper quadrant pain     ED Discharge Orders         Ordered    azithromycin (ZITHROMAX Z-PAK) 250 MG tablet        01/11/21 2152    predniSONE (DELTASONE) 10 MG tablet  Daily        01/11/21 2152    guaiFENesin-codeine 100-10 MG/5ML syrup  At  bedtime PRN        01/11/21 2152           Note:  This document was prepared using Dragon voice recognition software and may include unintentional dictation errors.   Lucy Chris, PA 01/12/21 Prentice Docker    Sharman Cheek, MD 01/12/21 2351

## 2021-01-16 ENCOUNTER — Telehealth: Payer: Self-pay | Admitting: Family Medicine

## 2021-01-16 ENCOUNTER — Encounter: Payer: Self-pay | Admitting: Internal Medicine

## 2021-01-16 NOTE — Telephone Encounter (Signed)
Called patient to speak to him about his my chart message. He stated that the guaifen codeine cough medication is not working and that he has a very deep cough, cough all the time and that his chest and midsection is very painful, feels like he broke all his ribs. Patient stated that he wanted a virtual appointment with his PCP. Virtual appointment was made with Dr. Laural Benes for tomorrow since his PCP is not here.

## 2021-01-17 ENCOUNTER — Ambulatory Visit
Admission: RE | Admit: 2021-01-17 | Discharge: 2021-01-17 | Disposition: A | Discharge status: Home / Self Care | Payer: Medicare Other | Attending: Family Medicine | Admitting: Family Medicine

## 2021-01-17 ENCOUNTER — Ambulatory Visit
Admission: RE | Admit: 2021-01-17 | Discharge: 2021-01-17 | Disposition: A | Discharge status: Home / Self Care | Payer: Medicare Other | Source: Ambulatory Visit | Attending: Family Medicine | Admitting: Family Medicine

## 2021-01-17 ENCOUNTER — Telehealth (INDEPENDENT_AMBULATORY_CARE_PROVIDER_SITE_OTHER): Payer: Medicare Other | Admitting: Family Medicine

## 2021-01-17 ENCOUNTER — Encounter: Payer: Self-pay | Admitting: Family Medicine

## 2021-01-17 VITALS — BP 146/81 | HR 94 | Temp 97.3°F | Wt 227.0 lb

## 2021-01-17 DIAGNOSIS — J209 Acute bronchitis, unspecified: Secondary | ICD-10-CM

## 2021-01-17 MED ORDER — PREDNISONE 10 MG PO TABS: ORAL_TABLET | ORAL | 0 refills | Status: DC

## 2021-01-17 MED ORDER — HYDROCOD POLST-CPM POLST ER 10-8 MG/5ML PO SUER: 5.0000 mL | Freq: Two times a day (BID) | ORAL | 0 refills | Status: DC | PRN

## 2021-01-17 NOTE — Progress Notes (Signed)
BP (!) 146/81   Pulse 94   Temp (!) 97.3 F (36.3 C)   Wt 227 lb (103 kg)   SpO2 97%   BMI 35.55 kg/m    Subjective:    Patient ID: Mike Kline, male    DOB: 1970-12-10, 50 y.o.   MRN: [id number]  HPI: Mike Kline is a 50 y.o. male  Chief Complaint  Patient presents with   Cough    Patient states he has been coughing about 1 week and is also wheezing.    UPPER RESPIRATORY TRACT INFECTION- was only given 3 days of 30mg  of prednisone and a z-pack- he feels like he is not feeling any better. Duration: about a week ago Worst symptom: cough Fever: no Cough: yes Shortness of breath: yes Wheezing: yes Chest pain: yes, with cough Chest tightness: yes Chest congestion: no Nasal congestion: no Runny nose: no Post nasal drip: no Sneezing: no Sore throat: no Swollen glands: no Sinus pressure: no Headache: yes Face pain: no Toothache: no Ear pain: no  Ear pressure: no  Eyes red/itching:no Eye drainage/crusting: no  Vomiting: no Rash: no Fatigue: yes Sick contacts: yes Strep contacts: no  Context: worse Recurrent sinusitis: no Relief with OTC cold/cough medications: no  Treatments attempted: cold/sinus, mucinex, anti-histamine, pseudoephedrine, and cough syrup, prednisone, azithromycin    Relevant past medical, surgical, family and social history reviewed and updated as indicated. Interim medical history since our last visit reviewed. Allergies and medications reviewed and updated.  Review of Systems  Constitutional:  Positive for fatigue. Negative for activity change, appetite change, chills, diaphoresis, fever and unexpected weight change.  HENT:  Negative for congestion, dental problem, drooling, ear discharge, ear pain, facial swelling, hearing loss, mouth sores, nosebleeds, postnasal drip, rhinorrhea, sinus pressure, sinus pain, sneezing, sore throat, tinnitus, trouble swallowing and voice change.   Respiratory:  Positive for cough, chest tightness, shortness  of breath and wheezing. Negative for apnea, choking and stridor.   Cardiovascular: Negative.   Gastrointestinal: Negative.   Musculoskeletal: Negative.   Skin: Negative.   Psychiatric/Behavioral: Negative.     Per HPI unless specifically indicated above     Objective:    BP (!) 146/81   Pulse 94   Temp (!) 97.3 F (36.3 C)   Wt 227 lb (103 kg)   SpO2 97%   BMI 35.55 kg/m   Wt Readings from Last 3 Encounters:  01/17/21 227 lb (103 kg)  01/11/21 214 lb (97.1 kg)  11/07/20 215 lb 3.2 oz (97.6 kg)    Physical Exam Vitals and nursing note reviewed.  Constitutional:      General: He is not in acute distress.    Appearance: Normal appearance. He is not ill-appearing, toxic-appearing or diaphoretic.  HENT:     Head: Normocephalic and atraumatic.     Right Ear: External ear normal.     Left Ear: External ear normal.     Nose: Nose normal.     Mouth/Throat:     Mouth: Mucous membranes are moist.     Pharynx: Oropharynx is clear.  Eyes:     General: No scleral icterus.       Right eye: No discharge.        Left eye: No discharge.     Conjunctiva/sclera: Conjunctivae normal.     Pupils: Pupils are equal, round, and reactive to light.  Pulmonary:     Effort: Pulmonary effort is normal. No respiratory distress.     Comments: Speaking in  full sentences, Coughing deep repeatedly on call Musculoskeletal:        General: Normal range of motion.     Cervical back: Normal range of motion.  Skin:    Coloration: Skin is not jaundiced or pale.     Findings: No bruising, erythema, lesion or rash.  Neurological:     Mental Status: He is alert and oriented to person, place, and time. Mental status is at baseline.  Psychiatric:        Mood and Affect: Mood normal.        Behavior: Behavior normal.        Thought Content: Thought content normal.        Judgment: Judgment normal.    Results for orders placed or performed during the hospital encounter of 01/11/21  Resp Panel by  RT-PCR (Flu A&B, Covid) Nasopharyngeal Swab   Specimen: Nasopharyngeal Swab; Nasopharyngeal(NP) swabs in vial transport medium  Result Value Ref Range   SARS Coronavirus 2 by RT PCR NEGATIVE NEGATIVE   Influenza A by PCR NEGATIVE NEGATIVE   Influenza B by PCR NEGATIVE NEGATIVE  Comprehensive metabolic panel  Result Value Ref Range   Sodium 134 (L) 135 - 145 mmol/L   Potassium 4.5 3.5 - 5.1 mmol/L   Chloride 103 98 - 111 mmol/L   CO2 23 22 - 32 mmol/L   Glucose, Bld 232 (H) 70 - 99 mg/dL   BUN 17 6 - 20 mg/dL   Creatinine, Ser 7.48 0.61 - 1.24 mg/dL   Calcium 8.7 (L) 8.9 - 10.3 mg/dL   Total Protein 7.1 6.5 - 8.1 g/dL   Albumin 3.8 3.5 - 5.0 g/dL   AST 34 15 - 41 U/L   ALT 34 0 - 44 U/L   Alkaline Phosphatase 108 38 - 126 U/L   Total Bilirubin 0.8 0.3 - 1.2 mg/dL   GFR, Estimated >27 >07 mL/min   Anion gap 8 5 - 15  CBC with Differential  Result Value Ref Range   WBC 4.3 4.0 - 10.5 K/uL   RBC 4.94 4.22 - 5.81 MIL/uL   Hemoglobin 12.1 (L) 13.0 - 17.0 g/dL   HCT 86.7 (L) 54.4 - 92.0 %   MCV 76.1 (L) 80.0 - 100.0 fL   MCH 24.5 (L) 26.0 - 34.0 pg   MCHC 32.2 30.0 - 36.0 g/dL   RDW 10.0 71.2 - 19.7 %   Platelets 198 150 - 400 K/uL   nRBC 0.0 0.0 - 0.2 %   Neutrophils Relative % 62 %   Neutro Abs 2.7 1.7 - 7.7 K/uL   Lymphocytes Relative 21 %   Lymphs Abs 0.9 0.7 - 4.0 K/uL   Monocytes Relative 12 %   Monocytes Absolute 0.5 0.1 - 1.0 K/uL   Eosinophils Relative 3 %   Eosinophils Absolute 0.1 0.0 - 0.5 K/uL   Basophils Relative 1 %   Basophils Absolute 0.0 0.0 - 0.1 K/uL   Immature Granulocytes 1 %   Abs Immature Granulocytes 0.03 0.00 - 0.07 K/uL  Troponin I (High Sensitivity)  Result Value Ref Range   Troponin I (High Sensitivity) 6 <18 ng/L      Assessment & Plan:   Problem List Items Addressed This Visit   None Visit Diagnoses     Acute bronchitis, unspecified organism    -  Primary   X-ray negative in ER- will repeat. Will treat with longer taper of  prednisone. Await CXR. RTC in 3-4 days for in person eval of  lungs   Relevant Orders   DG Chest 2 View        Follow up plan: Return Monday or Tuesday- IN PERSON (covid negative) for recheck on breathing.    This visit was completed via video visit through MyChart due to the restrictions of the COVID-19 pandemic. All issues as above were discussed and addressed. Physical exam was done as above through visual confirmation on video through MyChart. If it was felt that the patient should be evaluated in the office, they were directed there. The patient verbally consented to this visit. Location of the patient: home Location of the provider: work Those involved with this call:  Provider: Olevia Perches, DO CMA: Rolley Sims, CMA Front Desk/Registration: Harriet Pho  Time spent on call:  15 minutes with patient face to face via video conference. More than 50% of this time was spent in counseling and coordination of care. 23 minutes total spent in review of patient's record and preparation of their chart.

## 2021-01-21 ENCOUNTER — Other Ambulatory Visit: Payer: Self-pay

## 2021-01-21 ENCOUNTER — Ambulatory Visit
Admission: RE | Admit: 2021-01-21 | Discharge: 2021-01-21 | Disposition: A | Discharge status: Home / Self Care | Payer: Medicare Other | Attending: Internal Medicine | Admitting: Internal Medicine

## 2021-01-21 ENCOUNTER — Encounter: Payer: Self-pay | Admitting: Internal Medicine

## 2021-01-21 ENCOUNTER — Ambulatory Visit
Admission: RE | Admit: 2021-01-21 | Discharge: 2021-01-21 | Disposition: A | Discharge status: Home / Self Care | Payer: Medicare Other | Source: Ambulatory Visit | Attending: Internal Medicine | Admitting: Internal Medicine

## 2021-01-21 ENCOUNTER — Ambulatory Visit (INDEPENDENT_AMBULATORY_CARE_PROVIDER_SITE_OTHER): Payer: Medicare Other | Admitting: Internal Medicine

## 2021-01-21 VITALS — BP 126/68 | HR 81 | Temp 98.5°F | Ht 67.0 in | Wt 230.0 lb

## 2021-01-21 DIAGNOSIS — R059 Cough, unspecified: Secondary | ICD-10-CM

## 2021-01-21 DIAGNOSIS — R609 Edema, unspecified: Secondary | ICD-10-CM

## 2021-01-21 DIAGNOSIS — R062 Wheezing: Secondary | ICD-10-CM

## 2021-01-21 MED ORDER — BENZONATATE 100 MG PO CAPS: 100.0000 mg | ORAL_CAPSULE | Freq: Two times a day (BID) | ORAL | 0 refills | Status: DC | PRN

## 2021-01-21 MED ORDER — FUROSEMIDE 40 MG PO TABS
40.0000 mg | ORAL_TABLET | Freq: Every day | ORAL | 0 refills | Status: DC
Start: 2021-01-21 — End: 2021-02-13

## 2021-01-21 MED ORDER — ALBUTEROL SULFATE HFA 108 (90 BASE) MCG/ACT IN AERS: 2.0000 | INHALATION_SPRAY | Freq: Four times a day (QID) | RESPIRATORY_TRACT | 2 refills | Status: DC | PRN

## 2021-01-21 MED ORDER — AZITHROMYCIN 250 MG PO TABS: ORAL_TABLET | ORAL | 0 refills | Status: AC

## 2021-01-21 MED ORDER — HYDROCOD POLST-CPM POLST ER 10-8 MG/5ML PO SUER: 5.0000 mL | Freq: Two times a day (BID) | ORAL | 0 refills | Status: DC | PRN

## 2021-01-21 NOTE — Progress Notes (Signed)
There were no vitals taken for this visit.   Subjective:    Patient ID: Mike Kline, male    DOB: 06-16-1971, 50 y.o.   MRN: [id number]  No chief complaint on file.   HPI: Mike Kline is a 50 y.o. male  Dysuria secondary to worsening cough that he has a new onset of lower extremity pitting pedal edema for the last 2 days.  He was in the ER on 4 June and was treated with steroids had chest x-ray and a CT chest which was normal.  He claims that most of his family members are sick with the flu he did test for COVID and that was negative.  Continues to Have a very annoying cough .   Cough This is a recurrent problem. The current episode started 1 to 4 weeks ago. The problem has been gradually worsening. Associated symptoms include shortness of breath and wheezing. Pertinent negatives include no chest pain, ear congestion, ear pain, fever, headaches, heartburn, hemoptysis, myalgias, nasal congestion, postnasal drip, rash, rhinorrhea, sore throat, sweats or weight loss.   No chief complaint on file.   Relevant past medical, surgical, family and social history reviewed and updated as indicated. Interim medical history since our last visit reviewed. Allergies and medications reviewed and updated.  Review of Systems  Constitutional:  Negative for fever and weight loss.  HENT:  Negative for ear pain, postnasal drip, rhinorrhea and sore throat.   Respiratory:  Positive for cough, shortness of breath and wheezing. Negative for hemoptysis.   Cardiovascular:  Negative for chest pain.  Gastrointestinal:  Negative for heartburn.  Musculoskeletal:  Negative for myalgias.  Skin:  Negative for rash.  Neurological:  Negative for headaches.   Per HPI unless specifically indicated above     Objective:    There were no vitals taken for this visit.  Wt Readings from Last 3 Encounters:  01/17/21 227 lb (103 kg)  01/11/21 214 lb (97.1 kg)  11/07/20 215 lb 3.2 oz (97.6 kg)    Physical  Exam Vitals and nursing note reviewed.  Constitutional:      General: He is not in acute distress.    Appearance: Normal appearance. He is not ill-appearing or diaphoretic.  HENT:     Head: Normocephalic and atraumatic.     Right Ear: Tympanic membrane and external ear normal. There is no impacted cerumen.     Left Ear: External ear normal.     Nose: No congestion or rhinorrhea.     Mouth/Throat:     Pharynx: No oropharyngeal exudate or posterior oropharyngeal erythema.  Eyes:     Conjunctiva/sclera: Conjunctivae normal.     Pupils: Pupils are equal, round, and reactive to light.  Cardiovascular:     Rate and Rhythm: Normal rate and regular rhythm.     Heart sounds: No murmur heard.   No friction rub. No gallop.  Pulmonary:     Effort: No respiratory distress.     Breath sounds: No stridor. No wheezing or rhonchi.  Chest:     Chest wall: No tenderness.  Abdominal:     General: Abdomen is flat. Bowel sounds are normal.     Palpations: Abdomen is soft. There is no mass.     Tenderness: There is no abdominal tenderness.  Musculoskeletal:     Cervical back: Normal range of motion and neck supple. No rigidity or tenderness.     Left lower leg: No edema.  Skin:    General: Skin  is warm and dry.  Neurological:     Mental Status: He is alert.    Results for orders placed or performed during the hospital encounter of 01/11/21  Resp Panel by RT-PCR (Flu A&B, Covid) Nasopharyngeal Swab   Specimen: Nasopharyngeal Swab; Nasopharyngeal(NP) swabs in vial transport medium  Result Value Ref Range   SARS Coronavirus 2 by RT PCR NEGATIVE NEGATIVE   Influenza A by PCR NEGATIVE NEGATIVE   Influenza B by PCR NEGATIVE NEGATIVE  Comprehensive metabolic panel  Result Value Ref Range   Sodium 134 (L) 135 - 145 mmol/L   Potassium 4.5 3.5 - 5.1 mmol/L   Chloride 103 98 - 111 mmol/L   CO2 23 22 - 32 mmol/L   Glucose, Bld 232 (H) 70 - 99 mg/dL   BUN 17 6 - 20 mg/dL   Creatinine, Ser 2.87 0.61 -  1.24 mg/dL   Calcium 8.7 (L) 8.9 - 10.3 mg/dL   Total Protein 7.1 6.5 - 8.1 g/dL   Albumin 3.8 3.5 - 5.0 g/dL   AST 34 15 - 41 U/L   ALT 34 0 - 44 U/L   Alkaline Phosphatase 108 38 - 126 U/L   Total Bilirubin 0.8 0.3 - 1.2 mg/dL   GFR, Estimated >86 >76 mL/min   Anion gap 8 5 - 15  CBC with Differential  Result Value Ref Range   WBC 4.3 4.0 - 10.5 K/uL   RBC 4.94 4.22 - 5.81 MIL/uL   Hemoglobin 12.1 (L) 13.0 - 17.0 g/dL   HCT 72.0 (L) 94.7 - 09.6 %   MCV 76.1 (L) 80.0 - 100.0 fL   MCH 24.5 (L) 26.0 - 34.0 pg   MCHC 32.2 30.0 - 36.0 g/dL   RDW 28.3 66.2 - 94.7 %   Platelets 198 150 - 400 K/uL   nRBC 0.0 0.0 - 0.2 %   Neutrophils Relative % 62 %   Neutro Abs 2.7 1.7 - 7.7 K/uL   Lymphocytes Relative 21 %   Lymphs Abs 0.9 0.7 - 4.0 K/uL   Monocytes Relative 12 %   Monocytes Absolute 0.5 0.1 - 1.0 K/uL   Eosinophils Relative 3 %   Eosinophils Absolute 0.1 0.0 - 0.5 K/uL   Basophils Relative 1 %   Basophils Absolute 0.0 0.0 - 0.1 K/uL   Immature Granulocytes 1 %   Abs Immature Granulocytes 0.03 0.00 - 0.07 K/uL  Troponin I (High Sensitivity)  Result Value Ref Range   Troponin I (High Sensitivity) 6 <18 ng/L        Current Outpatient Medications:    albuterol (VENTOLIN HFA) 108 (90 Base) MCG/ACT inhaler, Inhale 2 puffs into the lungs every 6 (six) hours as needed for wheezing or shortness of breath (Cough)., Disp: 8 g, Rfl: 2   amitriptyline (ELAVIL) 100 MG tablet, Take 1 tablet (100 mg total) by mouth at bedtime., Disp: 30 tablet, Rfl: 3   chlorpheniramine-HYDROcodone (TUSSIONEX PENNKINETIC ER) 10-8 MG/5ML SUER, Take 5 mLs by mouth every 12 (twelve) hours as needed., Disp: 50 mL, Rfl: 0   famotidine (PEPCID) 20 MG tablet, Take 1 tablet (20 mg total) by mouth at bedtime., Disp: 30 tablet, Rfl: 1   fexofenadine (ALLEGRA ALLERGY) 180 MG tablet, Take 1 tablet (180 mg total) by mouth daily., Disp: 30 tablet, Rfl: 1   fluticasone furoate-vilanterol (BREO ELLIPTA) 200-25 MCG/INH  AEPB, Inhale 1 puff into the lungs daily., Disp: 28 each, Rfl: 2   gabapentin (NEURONTIN) 300 MG capsule, Take 1 capsule (300  mg total) by mouth 3 (three) times daily., Disp: 270 capsule, Rfl: 2   HUMALOG KWIKPEN 100 UNIT/ML KiwkPen, USE THREE TIMES A DAY AS DIRECTED.(UP TO 50 UNITS A DAY OR AS DIRECTED BY SLIDING SCALE) (Patient taking differently: Inject 6-10 Units into the skin 3 (three) times daily.), Disp: 15 mL, Rfl: 6   insulin glargine (LANTUS SOLOSTAR) 100 UNIT/ML Solostar Pen, Inject 40 Units into the skin daily at 10 pm., Disp: , Rfl:    losartan (COZAAR) 25 MG tablet, Take 1 tablet (25 mg total) by mouth daily., Disp: 90 tablet, Rfl: 1   metoprolol tartrate (LOPRESSOR) 25 MG tablet, Take 1 tablet (25 mg total) by mouth 2 (two) times daily., Disp: 90 tablet, Rfl: 1   montelukast (SINGULAIR) 10 MG tablet, Take 1 tablet (10 mg total) by mouth at bedtime., Disp: 30 tablet, Rfl: 3   pantoprazole (PROTONIX) 40 MG tablet, Take 1 tablet (40 mg total) by mouth daily., Disp: 90 tablet, Rfl: 1   pramipexole (MIRAPEX) 0.125 MG tablet, Take 1 tablet (0.125 mg total) by mouth every evening., Disp: 90 tablet, Rfl: 0   predniSONE (DELTASONE) 10 MG tablet, 6 tabs today and tomorrow, 5 tabs the next 2 days, decrease by 1 every other day until gone., Disp: 42 tablet, Rfl: 0   rosuvastatin (CRESTOR) 40 MG tablet, Take 1 tablet (40 mg total) by mouth daily., Disp: 90 tablet, Rfl: 2   sildenafil (VIAGRA) 50 MG tablet, Take 50 mg by mouth daily as needed., Disp: , Rfl:    tadalafil (CIALIS) 10 MG tablet, Take 10 mg by mouth daily as needed., Disp: , Rfl:     Assessment & Plan:  Bil pitting pedal edema :with SOB  ? CHF  Will start pt on lasix 40 mg daily to fu with Dr. Juliann Pares, I have personally d/w him via secure chat and he will see pt today at 2 pm.   @ [employer].  Duonebs x 1 administered to pt. To use at home.  O2 sats stable at 98%   Wheezing/ cough : Will start zpak, is on prednisone per ER  visit. Tapering dose.  Will need to fu with pulm as OP as well  Continue current inhalers    Problem List Items Addressed This Visit   None Visit Diagnoses     Cough    -  Primary   Relevant Orders   DG Chest 2 View (Completed)   Wheezing       Relevant Medications   ipratropium-albuterol (DUONEB) 0.5-2.5 (3) MG/3ML nebulizer solution 3 mL (Completed)   methylPREDNISolone sodium succinate (SOLU-MEDROL) 40 mg/mL injection 80 mg (Completed)   Edema, unspecified type            Follow up plan: Return in about 2 weeks (around 02/04/2021) for cough.

## 2021-01-22 ENCOUNTER — Encounter: Payer: Self-pay | Admitting: Internal Medicine

## 2021-01-22 DIAGNOSIS — R062 Wheezing: Secondary | ICD-10-CM

## 2021-01-22 MED ORDER — IPRATROPIUM-ALBUTEROL 0.5-2.5 (3) MG/3ML IN SOLN
3.0000 mL | Freq: Once | RESPIRATORY_TRACT | Status: AC
  Administered 2021-01-22: 3 mL via RESPIRATORY_TRACT

## 2021-01-22 MED ORDER — METHYLPREDNISOLONE SODIUM SUCC 40 MG IJ SOLR
80.0000 mg | Freq: Once | INTRAMUSCULAR | Status: AC
  Administered 2021-01-22: 80 mg via INTRAMUSCULAR

## 2021-01-30 ENCOUNTER — Other Ambulatory Visit: Payer: Self-pay | Admitting: Internal Medicine

## 2021-01-31 ENCOUNTER — Other Ambulatory Visit: Payer: Self-pay

## 2021-01-31 DIAGNOSIS — K449 Diaphragmatic hernia without obstruction or gangrene: Secondary | ICD-10-CM

## 2021-02-04 ENCOUNTER — Ambulatory Visit: Payer: Medicare Other | Admitting: Internal Medicine

## 2021-02-05 ENCOUNTER — Other Ambulatory Visit: Payer: Self-pay | Admitting: Surgery

## 2021-02-05 ENCOUNTER — Ambulatory Visit
Admission: RE | Admit: 2021-02-05 | Discharge: 2021-02-05 | Disposition: A | Discharge status: Home / Self Care | Payer: Medicare Other | Source: Ambulatory Visit | Attending: Surgery | Admitting: Surgery

## 2021-02-05 ENCOUNTER — Other Ambulatory Visit: Payer: Self-pay

## 2021-02-05 ENCOUNTER — Telehealth: Payer: Self-pay

## 2021-02-05 ENCOUNTER — Encounter: Payer: Self-pay | Admitting: Internal Medicine

## 2021-02-05 ENCOUNTER — Ambulatory Visit (INDEPENDENT_AMBULATORY_CARE_PROVIDER_SITE_OTHER): Payer: Medicare Other | Admitting: Internal Medicine

## 2021-02-05 VITALS — BP 150/79 | HR 92 | Temp 98.6°F | Ht 67.0 in | Wt 223.4 lb

## 2021-02-05 DIAGNOSIS — L03119 Cellulitis of unspecified part of limb: Secondary | ICD-10-CM

## 2021-02-05 DIAGNOSIS — K449 Diaphragmatic hernia without obstruction or gangrene: Secondary | ICD-10-CM

## 2021-02-05 DIAGNOSIS — R112 Nausea with vomiting, unspecified: Secondary | ICD-10-CM

## 2021-02-05 DIAGNOSIS — R109 Unspecified abdominal pain: Secondary | ICD-10-CM

## 2021-02-05 LAB — CBC WITH DIFFERENTIAL/PLATELET
Hematocrit: 36.7 % — ABNORMAL LOW (ref 37.5–51.0)
Hemoglobin: 11.5 g/dL — ABNORMAL LOW (ref 13.0–17.7)
Lymphocytes Absolute: 1.2 10*3/uL (ref 0.7–3.1)
Lymphs: 16 %
MCH: 24.6 pg — ABNORMAL LOW (ref 26.6–33.0)
MCHC: 31.3 g/dL — ABNORMAL LOW (ref 31.5–35.7)
MCV: 79 fL (ref 79–97)
MID (Absolute): 0.6 10*3/uL (ref 0.1–1.6)
MID: 8 %
Neutrophils Absolute: 5.3 10*3/uL (ref 1.4–7.0)
Neutrophils: 76 %
Platelets: 231 10*3/uL (ref 150–450)
RBC: 4.67 x10E6/uL (ref 4.14–5.80)
RDW: 15.4 % (ref 11.6–15.4)
WBC: 7.1 10*3/uL (ref 3.4–10.8)

## 2021-02-05 MED ORDER — ONDANSETRON HCL 8 MG PO TABS: 8.0000 mg | ORAL_TABLET | Freq: Three times a day (TID) | ORAL | 0 refills | Status: DC | PRN

## 2021-02-05 MED ORDER — SULFAMETHOXAZOLE-TRIMETHOPRIM 800-160 MG PO TABS: 1.0000 | ORAL_TABLET | Freq: Two times a day (BID) | ORAL | 0 refills | Status: AC

## 2021-02-05 NOTE — Telephone Encounter (Signed)
The patient did not follow-up after his initial evaluation here.  He stated that he could not make afternoon appointments however this was not an issue when he scheduled the appointment.  He then also discontinued using Breo which likely helped his cough previously.  We will be happy to get him scheduled he can be seen either with me or the nurse practitioner next available appointment.  The scan did not show any major abnormalities in the lung with regards to the cough.  Breo appeared to have been effective but he wanted to give a trial off of it.  Do call the patient and check to see if Virgel Bouquet was indeed helping him and we can reissue a prescription.  This would be for Breo 200/25, 1 inhalation daily.

## 2021-02-05 NOTE — Telephone Encounter (Signed)
Nurse from pt's PCP sent me a message needing Mike Kline to be seen in office ASAP. I let the nurse know that if he needs to be seen ASAP that per Dr Jayme Cloud that he will need to be seen in the  ED and recommend having him get a PCR test, nurse states he has had this cough for 4 months.

## 2021-02-05 NOTE — Telephone Encounter (Signed)
LVM in regards to pt using breo 200, will try giving him a call back at a later time.

## 2021-02-05 NOTE — Progress Notes (Signed)
There were no vitals taken for this visit.   Subjective:    Patient ID: Mike Kline, male    DOB: 1971/04/13, 50 y.o.   MRN: [id number]  No chief complaint on file.   HPI: Mike Kline is a 50 y.o. male  Cough This is a recurrent problem. The current episode started in the past 7 days. Associated symptoms include a rash. Pertinent negatives include no fever, rhinorrhea, sore throat or shortness of breath.  Rash This is a recurrent (bil lower ext rash noted with erythema) problem. The problem has been gradually worsening since onset. The rash is characterized by redness and swelling. Associated symptoms include coughing. Pertinent negatives include no anorexia, congestion, diarrhea, eye pain, facial edema, fatigue, fever, joint pain, nail changes, rhinorrhea, shortness of breath or sore throat. There is no history of eczema or varicella.   No chief complaint on file.   Relevant past medical, surgical, family and social history reviewed and updated as indicated. Interim medical history since our last visit reviewed. Allergies and medications reviewed and updated.  Review of Systems  Constitutional:  Negative for fatigue and fever.  HENT:  Negative for congestion, rhinorrhea and sore throat.   Eyes:  Negative for pain.  Respiratory:  Positive for cough. Negative for shortness of breath.   Gastrointestinal:  Negative for anorexia and diarrhea.  Musculoskeletal:  Negative for joint pain.  Skin:  Positive for rash. Negative for nail changes.   Per HPI unless specifically indicated above     Objective:    There were no vitals taken for this visit.  Wt Readings from Last 3 Encounters:  01/21/21 230 lb (104.3 kg)  01/17/21 227 lb (103 kg)  01/11/21 214 lb (97.1 kg)    Physical Exam Vitals and nursing note reviewed.  Constitutional:      General: He is not in acute distress.    Appearance: Normal appearance. He is not ill-appearing or diaphoretic.  HENT:     Head:  Normocephalic and atraumatic.     Right Ear: Tympanic membrane and external ear normal. There is no impacted cerumen.     Left Ear: External ear normal.     Nose: No congestion or rhinorrhea.     Mouth/Throat:     Pharynx: No oropharyngeal exudate or posterior oropharyngeal erythema.  Eyes:     Conjunctiva/sclera: Conjunctivae normal.     Pupils: Pupils are equal, round, and reactive to light.  Cardiovascular:     Rate and Rhythm: Normal rate and regular rhythm.     Heart sounds: No murmur heard.   No friction rub. No gallop.  Pulmonary:     Effort: No respiratory distress.     Breath sounds: No stridor. No wheezing or rhonchi.  Chest:     Chest wall: No tenderness.  Abdominal:     General: Abdomen is flat. Bowel sounds are normal. There is distension.     Palpations: Abdomen is soft. There is no mass.     Tenderness: There is no abdominal tenderness.     Hernia: No hernia is present.  Musculoskeletal:     Cervical back: Normal range of motion and neck supple. No rigidity or tenderness.     Left lower leg: No edema.  Skin:    General: Skin is warm and dry.  Neurological:     Mental Status: He is alert.    Results for orders placed or performed during the hospital encounter of 01/11/21  Resp Panel by RT-PCR (  Flu A&B, Covid) Nasopharyngeal Swab   Specimen: Nasopharyngeal Swab; Nasopharyngeal(NP) swabs in vial transport medium  Result Value Ref Range   SARS Coronavirus 2 by RT PCR NEGATIVE NEGATIVE   Influenza A by PCR NEGATIVE NEGATIVE   Influenza B by PCR NEGATIVE NEGATIVE  Comprehensive metabolic panel  Result Value Ref Range   Sodium 134 (L) 135 - 145 mmol/L   Potassium 4.5 3.5 - 5.1 mmol/L   Chloride 103 98 - 111 mmol/L   CO2 23 22 - 32 mmol/L   Glucose, Bld 232 (H) 70 - 99 mg/dL   BUN 17 6 - 20 mg/dL   Creatinine, Ser 1.95 0.61 - 1.24 mg/dL   Calcium 8.7 (L) 8.9 - 10.3 mg/dL   Total Protein 7.1 6.5 - 8.1 g/dL   Albumin 3.8 3.5 - 5.0 g/dL   AST 34 15 - 41 U/L    ALT 34 0 - 44 U/L   Alkaline Phosphatase 108 38 - 126 U/L   Total Bilirubin 0.8 0.3 - 1.2 mg/dL   GFR, Estimated >09 >32 mL/min   Anion gap 8 5 - 15  CBC with Differential  Result Value Ref Range   WBC 4.3 4.0 - 10.5 K/uL   RBC 4.94 4.22 - 5.81 MIL/uL   Hemoglobin 12.1 (L) 13.0 - 17.0 g/dL   HCT 67.1 (L) 24.5 - 80.9 %   MCV 76.1 (L) 80.0 - 100.0 fL   MCH 24.5 (L) 26.0 - 34.0 pg   MCHC 32.2 30.0 - 36.0 g/dL   RDW 98.3 38.2 - 50.5 %   Platelets 198 150 - 400 K/uL   nRBC 0.0 0.0 - 0.2 %   Neutrophils Relative % 62 %   Neutro Abs 2.7 1.7 - 7.7 K/uL   Lymphocytes Relative 21 %   Lymphs Abs 0.9 0.7 - 4.0 K/uL   Monocytes Relative 12 %   Monocytes Absolute 0.5 0.1 - 1.0 K/uL   Eosinophils Relative 3 %   Eosinophils Absolute 0.1 0.0 - 0.5 K/uL   Basophils Relative 1 %   Basophils Absolute 0.0 0.0 - 0.1 K/uL   Immature Granulocytes 1 %   Abs Immature Granulocytes 0.03 0.00 - 0.07 K/uL  Troponin I (High Sensitivity)  Result Value Ref Range   Troponin I (High Sensitivity) 6 <18 ng/L        Current Outpatient Medications:    albuterol (VENTOLIN HFA) 108 (90 Base) MCG/ACT inhaler, Inhale 2 puffs into the lungs every 6 (six) hours as needed for wheezing or shortness of breath (Cough)., Disp: 8 g, Rfl: 2   ALLERGY RELIEF 180 MG tablet, Take 1 tablet (180 mg total) by mouth daily., Disp: 30 tablet, Rfl: 0   amitriptyline (ELAVIL) 100 MG tablet, Take 1 tablet (100 mg total) by mouth at bedtime., Disp: 30 tablet, Rfl: 3   benzonatate (TESSALON) 100 MG capsule, Take 1 capsule (100 mg total) by mouth 2 (two) times daily as needed for cough., Disp: 20 capsule, Rfl: 0   chlorpheniramine-HYDROcodone (TUSSIONEX PENNKINETIC ER) 10-8 MG/5ML SUER, Take 5 mLs by mouth every 12 (twelve) hours as needed for cough., Disp: 60 mL, Rfl: 0   famotidine (PEPCID) 20 MG tablet, Take 1 tablet (20 mg total) by mouth at bedtime., Disp: 30 tablet, Rfl: 1   fluticasone furoate-vilanterol (BREO ELLIPTA) 200-25  MCG/INH AEPB, Inhale 1 puff into the lungs daily., Disp: 28 each, Rfl: 2   furosemide (LASIX) 40 MG tablet, Take 1 tablet (40 mg total) by mouth daily., Disp:  30 tablet, Rfl: 0   gabapentin (NEURONTIN) 300 MG capsule, Take 1 capsule (300 mg total) by mouth 3 (three) times daily., Disp: 270 capsule, Rfl: 2   HUMALOG KWIKPEN 100 UNIT/ML KiwkPen, USE THREE TIMES A DAY AS DIRECTED.(UP TO 50 UNITS A DAY OR AS DIRECTED BY SLIDING SCALE) (Patient taking differently: Inject 6-10 Units into the skin 3 (three) times daily.), Disp: 15 mL, Rfl: 6   insulin glargine (LANTUS SOLOSTAR) 100 UNIT/ML Solostar Pen, Inject 40 Units into the skin daily at 10 pm., Disp: , Rfl:    losartan (COZAAR) 25 MG tablet, Take 1 tablet (25 mg total) by mouth daily., Disp: 90 tablet, Rfl: 1   metoprolol tartrate (LOPRESSOR) 25 MG tablet, Take 1 tablet (25 mg total) by mouth 2 (two) times daily., Disp: 90 tablet, Rfl: 1   montelukast (SINGULAIR) 10 MG tablet, Take 1 tablet (10 mg total) by mouth at bedtime., Disp: 30 tablet, Rfl: 3   pantoprazole (PROTONIX) 40 MG tablet, Take 1 tablet (40 mg total) by mouth daily., Disp: 90 tablet, Rfl: 1   pramipexole (MIRAPEX) 0.125 MG tablet, Take 1 tablet (0.125 mg total) by mouth every evening., Disp: 90 tablet, Rfl: 0   predniSONE (DELTASONE) 10 MG tablet, 6 tabs today and tomorrow, 5 tabs the next 2 days, decrease by 1 every other day until gone., Disp: 42 tablet, Rfl: 0   rosuvastatin (CRESTOR) 40 MG tablet, Take 1 tablet (40 mg total) by mouth daily., Disp: 90 tablet, Rfl: 2   sildenafil (VIAGRA) 50 MG tablet, Take 50 mg by mouth daily as needed., Disp: , Rfl:    tadalafil (CIALIS) 10 MG tablet, Take 10 mg by mouth daily as needed., Disp: , Rfl:     Assessment & Plan:  Right and left lower ext cellulitis :  Will need to start pt on bactrim Ds x 7 days.  Check CBC.  WBC count wnl on STAT CBC obtained yesterday  SOB/ Lower ext swelling in the setting of a ho CAD will need to have a stress  test scheduled per Dr. Mariah Milling   ECHO wnl seen Dr. Mariah Milling for such  Left ventricular ejection fraction, by estimation, is 55 to 60%. The left ventricle has normal function. The left ventricle has no regional wall motion abnormalities. Left ventricular diastolic parameters are consistent with Grade I diastolic dysfunction (impaired relaxation). 2. Right ventricular systolic function is normal. The right ventricular size is normal. 3. Left atrial size was mildly dilated. 4. Right atrial size was mildly dilated. 5. The mitral valve is normal in structure. Trivial mitral valve regurgitation. No evidence of mitral stenosis. 6. The aortic valve is normal in structure. Aortic valve regurgitation is not visualized. No aortic stenosis is present. 7. The inferior vena cava is normal in size with g Fu and mx per Cards.   Chronic cough :  Will need to fu with Pulm for such , I had placed pt on breo has been taking this everyday to conitnue such until gets formal Pft;s / fu with PULm. Sees Dr. Sarina Ser. Needs to make an appt to fu  with such   Nausea / vomiting ? Sec to chronic cough vs cellulitis new onset.  Will refer to GI tomorrow able to see [place] [facility].  Start pt on zofran for such.  ? Could be sec to his elevated sugars.    Problem List Items Addressed This Visit   None Visit Diagnoses     Nausea and vomiting, intractability of  vomiting not specified, unspecified vomiting type    -  Primary   Relevant Orders   Ambulatory referral to Gastroenterology   Abdominal pain, unspecified abdominal location       Relevant Orders   Ambulatory referral to Gastroenterology   Cellulitis of lower extremity, unspecified laterality       Relevant Orders   CBC With Differential/Platelet        Orders Placed This Encounter  Procedures   Ambulatory referral to Gastroenterology     No orders of the defined types were placed in this encounter.    Follow up plan: No follow-ups on  file.

## 2021-02-06 ENCOUNTER — Ambulatory Visit: Payer: Medicare Other | Admitting: Gastroenterology

## 2021-02-06 VITALS — BP 147/62 | HR 116 | Temp 99.2°F | Wt 227.8 lb

## 2021-02-06 DIAGNOSIS — R059 Cough, unspecified: Secondary | ICD-10-CM

## 2021-02-06 DIAGNOSIS — R140 Abdominal distension (gaseous): Secondary | ICD-10-CM

## 2021-02-06 DIAGNOSIS — R112 Nausea with vomiting, unspecified: Secondary | ICD-10-CM | POA: Diagnosis not present

## 2021-02-06 NOTE — Progress Notes (Signed)
Melodie Bouillon, MD [address]  [address]  [place], [place] [address]  Main: [phone]  Fax: [phone]   Primary Care Physician: Loura Pardon, MD   Chief complaint: Abdominal distention  HPI: Mike Kline is a 50 y.o. male previously seen in 2020 for chest wall pain that was thought to be possibly musculoskeletal in etiology.  Patient was supposed to follow-up after that to reevaluate any ongoing symptoms of pain and after evaluation by cardiology to see if an EGD would be needed along with screening colonoscopy.  However, patient did not follow-up after that..  Patient is reporting abdominal distention over the last 3 to 6 months, intermittent nausea and vomiting with no hematemesis.  No dysphagia.  No prior EGD or colonoscopy.  He was seen by PCP yesterday for the above symptoms and referred to [place].  PCP note reviewed and patient also has bilateral lower extremity rash with erythema and cough.  He is coughing today during his exam, dry cough.  Also has pen markings at his lower extremity erythema site.  Patient states that antibiotics were prescribed but he has not picked them up yet.  He states he was also advised to call pulmonary to make an appointment and he has not done so.  As per PCP notes, patient was previously seen by Dr. Jayme Cloud.  PCP prescribed him Breo and advised him to follow-up with Dr. Jayme Cloud.  Since his last visit he has also been seen by Dr. Everlene Farrier for reflux.  As per his note that I reviewed, their plan was for her imaging and referral to GI for endoscopy.   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, left shoulder   Anginal pain (HCC)    Blindness of left eye    Diabetes mellitus without complication (HCC)    Diabetic, retinopathy, proliferative (HCC)    GERD (gastroesophageal reflux disease)    Hyperlipidemia    Hypertension      Past Surgical History:  Procedure Laterality Date   APPENDECTOMY     CABG x4  06/02/2019   [place]   CARPAL TUNNEL RELEASE Bilateral    cataract surgery Bilateral 2014   EYE SURGERY Left    LAA clip Left 06/02/2019   [place]   LEFT HEART CATH AND CORONARY ANGIOGRAPHY Left 05/22/2019   Procedure: LEFT HEART CATH AND CORONARY ANGIOGRAPHY;  Surgeon: Alwyn Pea, MD;  Location: [facility] INVASIVE CV LAB;  Service: Cardiovascular;  Laterality: Left;   SHOULDER SURGERY Left 2009    Prior to Admission medications   Medication Sig Start Date End Date Taking? Authorizing Provider  albuterol (VENTOLIN HFA) 108 (90 Base) MCG/ACT inhaler Inhale 2 puffs into the lungs every 6 (six) hours as needed for wheezing or shortness of breath (Cough). 01/21/21  Yes Vigg, Avanti, MD  ALLERGY RELIEF 180 MG tablet Take 1 tablet (180 mg total) by mouth daily. 01/30/21  Yes Vigg, Avanti, MD  amitriptyline (ELAVIL) 100 MG tablet Take 1 tablet (100 mg total) by mouth at bedtime. 10/17/20  Yes Vigg, Avanti, MD  fluticasone furoate-vilanterol (BREO ELLIPTA) 200-25 MCG/INH AEPB Inhale 1 puff into the lungs daily. 11/07/20  Yes Salena Saner, MD  furosemide (LASIX) 40 MG tablet Take 1 tablet (40 mg total) by mouth daily. 01/21/21  Yes Vigg, Avanti, MD  gabapentin (NEURONTIN) 300 MG capsule Take 1 capsule (300  mg total) by mouth 3 (three) times daily. 10/17/20  Yes Vigg, Avanti, MD  HUMALOG KWIKPEN 100 UNIT/ML KiwkPen USE THREE TIMES A DAY AS DIRECTED.(UP TO 50 UNITS A DAY OR AS DIRECTED BY SLIDING SCALE) Patient taking differently: Inject 6-10 Units into the skin 3 (three) times daily. 12/30/15  Yes Crissman, Redge Gainer, MD  insulin glargine (LANTUS SOLOSTAR) 100 UNIT/ML Solostar Pen Inject 40 Units into the skin daily at 10 pm. 06/20/20  Yes Darlin Priestly, MD  losartan (COZAAR) 25 MG tablet Take 1 tablet (25 mg total) by mouth daily. 10/17/20  Yes Vigg, Avanti, MD  metoprolol tartrate (LOPRESSOR) 25 MG tablet Take 1  tablet (25 mg total) by mouth 2 (two) times daily. 10/17/20  Yes Vigg, Avanti, MD  montelukast (SINGULAIR) 10 MG tablet Take 1 tablet (10 mg total) by mouth at bedtime. 11/12/20  Yes Vigg, Avanti, MD  ondansetron (ZOFRAN) 8 MG tablet Take 1 tablet (8 mg total) by mouth every 8 (eight) hours as needed for nausea or vomiting. 02/05/21  Yes Vigg, Avanti, MD  pantoprazole (PROTONIX) 40 MG tablet Take 1 tablet (40 mg total) by mouth daily. 08/21/20  Yes Cannady, Jolene T, NP  pramipexole (MIRAPEX) 0.125 MG tablet Take 1 tablet (0.125 mg total) by mouth every evening. 01/09/21  Yes Vigg, Avanti, MD  sildenafil (VIAGRA) 50 MG tablet Take 50 mg by mouth daily as needed. 09/18/20  Yes [provider]  sulfamethoxazole-trimethoprim (BACTRIM DS) 800-160 MG tablet Take 1 tablet by mouth 2 (two) times daily for 7 days. 02/05/21 02/12/21 Yes Vigg, Avanti, MD    Family History  Problem Relation Age of Onset   Bone cancer Mother      Social History   Tobacco Use   Smoking status: Never   Smokeless tobacco: Never   Tobacco comments:    Never  Vaping Use   Vaping Use: Never used  Substance Use Topics   Alcohol use: Not Currently    Comment: socially   Drug use: No    Allergies as of 02/06/2021   (No Known Allergies)    Physical Examination:  Constitutional: General:   Alert,  Well-developed, well-nourished, pleasant and cooperative in NAD BP (!) 147/62   Pulse (!) 116   Temp 99.2 F (37.3 C) (Oral)   Wt 227 lb 12.8 oz (103.3 kg)   BMI 35.68 kg/m   Respiratory: Normal respiratory effort  Gastrointestinal:  Soft, non-tender, distended without masses, hepatosplenomegaly or hernias noted.  No guarding or rebound tenderness.     Cardiac: No clubbing or edema.  No cyanosis. Normal posterior tibial pedal pulses noted.  Psych:  Alert and cooperative. Normal mood and affect.  Musculoskeletal:  Normal gait. Head normocephalic, atraumatic. Symmetrical without gross deformities. 5/5 Lower  extremity strength bilaterally.  Skin: Warm. Intact without significant lesions or rashes. No jaundice.  Neck: Supple, trachea midline  Lymph: No cervical lymphadenopathy  Psych:  Alert and oriented x3, Alert and cooperative. Normal mood and affect.  Labs: CMP     Component Value Date/Time   NA 134 (L) 01/11/2021 1935   NA 135 10/23/2020 0855   K 4.5 01/11/2021 1935   CL 103 01/11/2021 1935   CO2 23 01/11/2021 1935   GLUCOSE 232 (H) 01/11/2021 1935   BUN 17 01/11/2021 1935   BUN 19 10/23/2020 0855   CREATININE 1.14 01/11/2021 1935   CALCIUM 8.7 (L) 01/11/2021 1935   PROT 7.1 01/11/2021 1935   PROT 6.9 10/23/2020 0855   ALBUMIN 3.8  01/11/2021 1935   ALBUMIN 4.0 10/23/2020 0855   AST 34 01/11/2021 1935   AST 31 12/15/2016 1418   ALT 34 01/11/2021 1935   ALT 35 12/15/2016 1418   ALKPHOS 108 01/11/2021 1935   BILITOT 0.8 01/11/2021 1935   BILITOT 0.2 10/23/2020 0855   GFRNONAA >60 01/11/2021 1935   GFRAA 80 04/14/2019 1141   Lab Results  Component Value Date   WBC 7.1 02/05/2021   HGB 11.5 (L) 02/05/2021   HCT 36.7 (L) 02/05/2021   MCV 79 02/05/2021   PLT 231 02/05/2021    Imaging Studies: CT abdomen pelvis in October 2020 with large stool burden in the colon.  The report also states colonic mass with strictures cannot be excluded.  CT chest abdomen pelvis in June 2022 without contrast did not show any acute abnormalities.  Patulous esophagus was noted.  Esophagram done yesterday's showed slightly patulous esophagus with diminished esophageal peristalsis.  Ultrasound for ascites in March 2022 showed trace subhepatic free fluid  Assessment and Plan:   Mike Kline is a 50 y.o. y/o male with abdominal bloating and distention, intermittent nausea vomiting, with cough and lower extremity erythema  Patient has does have a distended abdomen, which may be due to metabolic syndrome.  However, patient states this has acutely changed over the last 6 months.  I will  obtain abdominal ultrasound to evaluate for any ascites as last ultrasound done was 3 months ago.  However, recent CT scan is otherwise reassuring.  Patient will eventually need EGD and colonoscopy.  Colonoscopy for screening and abnormal CT findings as per above in October 2020 although, June 2022 CT abdomen did not show the same findings.  October 2020 findings may be coincidental due to transient peristalsis in stool transit as written in the report as well.  EGD for intermittent nausea vomiting  However, patient visibly has a dry cough today and is awaiting evaluation by Dr. Jayme Cloud.  I have advised him to call them as soon as possible to make an appointment and as per his chart, Dr. Jayme Cloud did recommend seeing her in clinic as well.  He also has not started his antibiotics for lower extremity rash and it would be important for him to start that treatment  After above issues are optimized, can proceed with EGD and colonoscopy to allow for a safe procedure and not risk hypoxia during the procedure or other complications in the setting of cough  Close follow-up in clinic to reassess symptoms    Dr Melodie Bouillon

## 2021-02-06 NOTE — Telephone Encounter (Signed)
ATC pt and left a detailed message x 2 will be closing this encounter due to protocol, sending letter to patients address on file.

## 2021-02-06 NOTE — Patient Instructions (Signed)
We have scheduled your an ultrasound of abdomen on March 07, 2021 at [facility] entrance, arrive at 8:30 am to register.   Have nothing by mouth to eat or drink after 12 midnight the night before.

## 2021-02-12 ENCOUNTER — Other Ambulatory Visit
Admission: RE | Admit: 2021-02-12 | Discharge: 2021-02-12 | Disposition: A | Discharge status: Home / Self Care | Payer: Medicare Other | Source: Ambulatory Visit | Attending: Primary Care | Admitting: Primary Care

## 2021-02-12 ENCOUNTER — Ambulatory Visit: Payer: Medicare Other | Admitting: Primary Care

## 2021-02-12 ENCOUNTER — Encounter: Payer: Self-pay | Admitting: Primary Care

## 2021-02-12 ENCOUNTER — Other Ambulatory Visit: Payer: Self-pay

## 2021-02-12 VITALS — BP 100/60 | HR 91 | Ht 67.0 in | Wt 225.8 lb

## 2021-02-12 DIAGNOSIS — R059 Cough, unspecified: Secondary | ICD-10-CM | POA: Insufficient documentation

## 2021-02-12 DIAGNOSIS — R053 Chronic cough: Secondary | ICD-10-CM | POA: Diagnosis not present

## 2021-02-12 DIAGNOSIS — R1900 Intra-abdominal and pelvic swelling, mass and lump, unspecified site: Secondary | ICD-10-CM | POA: Insufficient documentation

## 2021-02-12 DIAGNOSIS — U071 COVID-19: Secondary | ICD-10-CM | POA: Insufficient documentation

## 2021-02-12 LAB — BRAIN NATRIURETIC PEPTIDE: B Natriuretic Peptide: 65.3 pg/mL (ref 0.0–100.0)

## 2021-02-12 MED ORDER — DEXTROMETHORPHAN-GUAIFENESIN 5-100 MG/5ML PO LIQD: 10.0000 mL | ORAL | 1 refills | Status: DC | PRN

## 2021-02-12 MED ORDER — FAMOTIDINE 20 MG PO TABS: 20.0000 mg | ORAL_TABLET | Freq: Every day | ORAL | 3 refills | Status: DC

## 2021-02-12 MED ORDER — FLUTICASONE FUROATE-VILANTEROL 200-25 MCG/INH IN AEPB
1.0000 | INHALATION_SPRAY | Freq: Every day | RESPIRATORY_TRACT | 11 refills | Status: DC
Start: 2021-02-12 — End: 2021-04-08

## 2021-02-12 NOTE — Assessment & Plan Note (Addendum)
-   Some improvement in cough on Breo 200. Still has coughing fits. No shortness of breath or chest tightness. He has associated reflux which he takes Protonix 40mg  once daily. CT imaging showed no acute abnormality or ILD. Recommend maximizing GERD therapy by adding Famotidine (Pepcid) 20mg  at bedtime. Also advised that he continue Singulair 10mg  at bedtime and consistently use Flonase nasal spray for possible PND contributing to cough. Needs pulmonary function testing as previously recommended and we will check respiratory allergy panel. Sending in RX for Dextromethorphan-guifenesin 22ml q 4 hours.   Recommendations:  Continue Breo 200 one puff daily in morning Continue Singulair 10mg  at bedtime Continue Protonix 40mg  daily; Add Pepcid 20mg  at bedtime Continue Neurontin 300mg  three times a day  Use flonase nasal spray every day Follow GERD diet. Avoid mint and menthol product Try sips of water and hard sugar free candies throughout the day to help suppress cough   Orders: Pulmonary function testing Resp allergy panel   Follow-up: 3 months with Dr. 

## 2021-02-12 NOTE — Progress Notes (Signed)
[id number]  Mike Kline

## 2021-02-12 NOTE — Patient Instructions (Addendum)
Recommendations: Continue Breo 200 one puff daily in morning Continue Singulair 10mg  at bedtime Continue Protonix 40mg  daily; Add Pepcid 20mg  at bedtime Continue Neurontin 300mg  three times a day  Use flonase nasal spray every day You can take delsym cough syrup (Rx sent) Follow GERD diet Avoid mint and menthol product Try sips of water and hard sugar free candies throughout the day to help suppress cough   Orders: Pulmonary function testing Resp allergy panel   Follow-up: 3 months with Dr.      Food Choices for Gastroesophageal Reflux Disease, Adult When you have gastroesophageal reflux disease (GERD), the foods you eat and your eating habits are very important. Choosing the right foods can help ease your discomfort. Think about working with a food expert (dietitian) to help you make good choices. What are tips for following this plan? Reading food labels Look for foods that are low in saturated fat. Foods that may help with your symptoms include: Foods that have less than 5% of daily value (DV) of fat. Foods that have 0 grams of trans fat. Cooking Do not fry your food. Cook your food by baking, steaming, grilling, or broiling. These are all methods that do not need a lot of fat for cooking. To add flavor, try to use herbs that are low in spice and acidity. Meal planning  Choose healthy foods that are low in fat, such as: Fruits and vegetables. Whole grains. Low-fat dairy products. Lean meats, fish, and poultry. Eat small meals often instead of eating 3 large meals each day. Eat your meals slowly in a place where you are relaxed. Avoid bending over or lying down until 2-3 hours after eating. Limit high-fat foods such as fatty meats or fried foods. Limit your intake of fatty foods, such as oils, butter, and shortening. Avoid the following as told by your doctor: Foods that cause symptoms. These may be different for different people. Keep a food diary to keep  track of foods that cause symptoms. Alcohol. Drinking a lot of liquid with meals. Eating meals during the 2-3 hours before bed.  Lifestyle Stay at a healthy weight. Ask your doctor what weight is healthy for you. If you need to lose weight, work with your doctor to do so safely. Exercise for at least 30 minutes on 5 or more days each week, or as told by your doctor. Wear loose-fitting clothes. Do not smoke or use any products that contain nicotine or tobacco. If you need help quitting, ask your doctor. Sleep with the head of your bed higher than your feet. Use a wedge under the mattress or blocks under the bed frame to raise the head of the bed. Chew sugar-free gum after meals. What foods should eat?  Eat a healthy, well-balanced diet of fruits, vegetables, whole grains, low-fatdairy products, lean meats, fish, and poultry. Each person is different. Foods that may cause symptoms in one person may not cause any symptoms inanother person. Work with your doctor to find foods that are safe for you. The items listed above may not be a complete list of what you can eat and drink. Contact a food expert for more options. What foods should I avoid? Limiting some of these foods may help in managing the symptoms of GERD. Everyone is different. Talk with a food expert or your doctor to help you findthe exact foods to avoid, if any. Fruits Any fruits prepared with added fat. Any fruits that cause symptoms. For some people, this  may include citrus fruits, such as oranges, grapefruit, pineapple,and lemons. Vegetables Deep-fried vegetables. [place] fries. Any vegetables prepared with added fat. Any vegetables that cause symptoms. For some people, this may include tomatoesand tomato products, chili peppers, onions and garlic, and horseradish. Grains Pastries or quick breads with added fat. Meats and other proteins High-fat meats, such as fatty beef or pork, hot dogs, ribs, ham, sausage, salami, and bacon.  Fried meat or protein, including fried fish and friedchicken. Nuts and nut butters, in large amounts. Dairy Whole milk and chocolate milk. Sour cream. Cream. Ice cream. Cream cheese.Milkshakes. Fats and oils Butter. Margarine. Shortening. Ghee. Beverages Coffee and tea, with or without caffeine. Carbonated beverages. Sodas. Energy drinks. Fruit juice made with acidic fruits, such as orange or grapefruit.Tomato juice. Alcoholic drinks. Sweets and desserts Chocolate and cocoa. Donuts. Seasonings and condiments Pepper. Peppermint and spearmint. Added salt. Any condiments, herbs, or seasonings that cause symptoms. For some people, this may include curry, hotsauce, or vinegar-based salad dressings. The items listed above may not be a complete list of what you should not eat and drink. Contact a food expert for more options. Questions to ask your doctor Diet and lifestyle changes are often the first steps that are taken to manage symptoms of GERD. If diet and lifestyle changes do not help, talk with yourdoctor about taking medicines. Where to find more information [employer]: aboutgerd.org Summary When you have GERD, food and lifestyle choices are very important in easing your symptoms. Eat small meals often instead of 3 large meals a day. Eat your meals slowly and in a place where you are relaxed. Avoid bending over or lying down until 2-3 hours after eating. Limit high-fat foods such as fatty meats or fried foods. This information is not intended to replace advice given to you by your health care provider. Make sure you discuss any questions you have with your healthcare provider. Document Revised: 02/05/2020 Document Reviewed: 02/05/2020 Elsevier Patient Education  2022 [employer].

## 2021-02-12 NOTE — Progress Notes (Signed)
@Patient  ID: , male    DOB: 10-Dec-1970, 50 y.o.   MRN: [id number]  No chief complaint on file.   Referring provider: [id number], MD  HPI: 50 year old male, never smoked.  Past medical history significant for respiratory failure, hypertension, coronary arthrosclerosis status post CABG, type 1 diabetes.  Patient Dr. 44, seen for initial consult on 11/07/2020 for cough.  02/12/2021 Patient presents today for overdue follow-up.  He did not follow-up with our office after initial evaluation.  Breo appears to have been effective but patient wanted trial off medication.  CT chest in April 2022 showed no acute cardiopulmonary abnormality.  No evidence of interstitial lung disease. Patient reports doing better after resuming Breo May 2022. He coughs when eating. He is on Protonix 40mg  daily for reflux. He still reports epigastric discomfort. He also has abdominal swelling and takes lasix 40mg  daily. He is currently on Bactrim for cellulitis. Denies shortness of breath or chest tightness.   No Known Allergies  Immunization History  Administered Date(s) Administered   Influenza,inj,Quad PF,6+ Mos 05/28/2015, 04/14/2019   Influenza-Unspecified 05/15/2014, 06/14/2020   PFIZER(Purple Top)SARS-COV-2 Vaccination 09/17/2020   Pneumococcal Conjugate-13 10/17/2020   Pneumococcal-Unspecified 03/05/2010   Td 09/20/2008    Past Medical History:  Diagnosis Date   Adhesive capsulitis    permanent disability 07/2010, left shoulder   Anginal pain (HCC)    Blindness of left eye    Diabetes mellitus without complication (HCC)    Diabetic, retinopathy, proliferative (HCC)    GERD (gastroesophageal reflux disease)    Hyperlipidemia    Hypertension     Tobacco History: Social History   Tobacco Use  Smoking Status Never  Smokeless Tobacco Never  Tobacco Comments   Never   Counseling given: Not Answered Tobacco comments: Never   Outpatient Medications Prior to Visit  Medication  Sig Dispense Refill   albuterol (VENTOLIN HFA) 108 (90 Base) MCG/ACT inhaler Inhale 2 puffs into the lungs every 6 (six) hours as needed for wheezing or shortness of breath (Cough). 8 g 2   ALLERGY RELIEF 180 MG tablet Take 1 tablet (180 mg total) by mouth daily. 30 tablet 0   amitriptyline (ELAVIL) 100 MG tablet Take 1 tablet (100 mg total) by mouth at bedtime. 30 tablet 3   fluticasone furoate-vilanterol (BREO ELLIPTA) 200-25 MCG/INH AEPB Inhale 1 puff into the lungs daily. 28 each 2   furosemide (LASIX) 40 MG tablet Take 1 tablet (40 mg total) by mouth daily. 30 tablet 0   gabapentin (NEURONTIN) 300 MG capsule Take 1 capsule (300 mg total) by mouth 3 (three) times daily. 270 capsule 2   HUMALOG KWIKPEN 100 UNIT/ML KiwkPen USE THREE TIMES A DAY AS DIRECTED.(UP TO 50 UNITS A DAY OR AS DIRECTED BY SLIDING SCALE) (Patient taking differently: Inject 6-10 Units into the skin 3 (three) times daily.) 15 mL 6   insulin glargine (LANTUS SOLOSTAR) 100 UNIT/ML Solostar Pen Inject 40 Units into the skin daily at 10 pm.     losartan (COZAAR) 25 MG tablet Take 1 tablet (25 mg total) by mouth daily. 90 tablet 1   metoprolol tartrate (LOPRESSOR) 25 MG tablet Take 1 tablet (25 mg total) by mouth 2 (two) times daily. 90 tablet 1   montelukast (SINGULAIR) 10 MG tablet Take 1 tablet (10 mg total) by mouth at bedtime. 30 tablet 3   ondansetron (ZOFRAN) 8 MG tablet Take 1 tablet (8 mg total) by mouth every 8 (eight) hours as needed for nausea or vomiting.  20 tablet 0   pantoprazole (PROTONIX) 40 MG tablet Take 1 tablet (40 mg total) by mouth daily. 90 tablet 1   pramipexole (MIRAPEX) 0.125 MG tablet Take 1 tablet (0.125 mg total) by mouth every evening. 90 tablet 0   sildenafil (VIAGRA) 50 MG tablet Take 50 mg by mouth daily as needed.     sulfamethoxazole-trimethoprim (BACTRIM DS) 800-160 MG tablet Take 1 tablet by mouth 2 (two) times daily for 7 days. 14 tablet 0   No facility-administered medications prior to  visit.   Review of Systems  Review of Systems  Constitutional: Negative.   HENT: Negative.    Respiratory:  Positive for cough. Negative for chest tightness, shortness of breath and wheezing.   Cardiovascular:  Positive for leg swelling.  Gastrointestinal:  Positive for abdominal distention.    Physical Exam  BP 100/60 (BP Location: Left Arm, Patient Position: Sitting, Cuff Size: Normal)   Pulse 91   Ht 5\' 7"  (1.702 m)   Wt 225 lb 12.8 oz (102.4 kg)   SpO2 96%   BMI 35.37 kg/m  Physical Exam Constitutional:      Appearance: Normal appearance.  HENT:     Head: Normocephalic and atraumatic.  Cardiovascular:     Rate and Rhythm: Normal rate and regular rhythm.  Pulmonary:     Effort: Pulmonary effort is normal.     Breath sounds: Normal breath sounds. No wheezing, rhonchi or rales.  Abdominal:     General: There is distension.  Neurological:     General: No focal deficit present.     Mental Status: He is alert and oriented to person, place, and time. Mental status is at baseline.  Psychiatric:        Mood and Affect: Mood normal.        Behavior: Behavior normal.        Thought Content: Thought content normal.        Judgment: Judgment normal.     Lab Results:  CBC    Component Value Date/Time   WBC 7.1 02/05/2021 1156   WBC 4.3 01/11/2021 1935   RBC 4.67 02/05/2021 1156   RBC 4.94 01/11/2021 1935   HGB 11.5 (L) 02/05/2021 1156   HCT 36.7 (L) 02/05/2021 1156   PLT 231 02/05/2021 1156   MCV 79 02/05/2021 1156   MCH 24.6 (L) 02/05/2021 1156   MCH 24.5 (L) 01/11/2021 1935   MCHC 31.3 (L) 02/05/2021 1156   MCHC 32.2 01/11/2021 1935   RDW 15.4 02/05/2021 1156   LYMPHSABS 1.2 02/05/2021 1156   MONOABS 0.5 01/11/2021 1935   EOSABS 0.1 01/11/2021 1935   EOSABS 0.3 10/23/2020 0855   BASOSABS 0.0 01/11/2021 1935   BASOSABS 0.0 10/23/2020 0855    BMET    Component Value Date/Time   NA 134 (L) 01/11/2021 1935   NA 135 10/23/2020 0855   K 4.5 01/11/2021 1935    CL 103 01/11/2021 1935   CO2 23 01/11/2021 1935   GLUCOSE 232 (H) 01/11/2021 1935   BUN 17 01/11/2021 1935   BUN 19 10/23/2020 0855   CREATININE 1.14 01/11/2021 1935   CALCIUM 8.7 (L) 01/11/2021 1935   GFRNONAA >60 01/11/2021 1935   GFRAA 80 04/14/2019 1141    BNP    Component Value Date/Time   BNP 114.2 (H) 06/13/2020 0550    ProBNP No results found for: PROBNP  Imaging: DG Chest 2 View  Result Date: 01/21/2021 CLINICAL DATA:  Cough, shortness of breath, and wheezing. EXAM:  CHEST - 2 VIEW COMPARISON:  Chest x-ray dated January 17, 2021. FINDINGS: Normal heart size status post CABG and left atrial appendage clipping. Normal pulmonary vascularity. Unchanged mild scarring in both lungs. No focal consolidation, pleural effusion, or pneumothorax. No acute osseous abnormality. IMPRESSION: 1. No acute cardiopulmonary disease. Electronically Signed   By: Obie Dredge M.D.   On: 01/21/2021 12:03   DG Chest 2 View  Result Date: 01/20/2021 CLINICAL DATA:  Nonproductive cough for 1 week EXAM: CHEST - 2 VIEW COMPARISON:  01/11/2021 FINDINGS: Slight increased vascular congestion is noted. No interstitial edema is seen. No focal infiltrate or sizable effusion is noted. Degenerative changes of the thoracic spine are seen. Postsurgical changes are noted. IMPRESSION: Mild vascular congestion without interstitial edema. No other focal abnormality is noted. Electronically Signed   By: Alcide Clever M.D.   On: 01/20/2021 09:46   DG ESOPHAGUS W DOUBLE CM (HD)  Result Date: 02/05/2021 CLINICAL DATA:  Hiatal hernia. EXAM: ESOPHOGRAM / BARIUM SWALLOW / BARIUM TABLET STUDY TECHNIQUE: Combined double contrast and single contrast examination performed using effervescent crystals, thick barium liquid, and thin barium liquid. The patient was observed with fluoroscopy swallowing a 13 mm barium sulphate tablet. FLUOROSCOPY TIME:  Fluoroscopy Time:  2 minutes 18 seconds Radiation Exposure Index (if provided by the  fluoroscopic device): 79.5 mGy COMPARISON:  Chest x-ray 01/21/2021. FINDINGS: Cervical esophagus is widely patent. No aspiration. Slightly patulous esophagus with diminished esophageal peristalsis. No obstructing abnormality identified. No prominent hiatal hernia. No prominent reflux. No hiatal hernia. Standard barium tablet passed easily into the stomach. IMPRESSION: The esophagus is slightly patulous with diminished esophageal peristalsis. No obstructing abnormality identified. No prominent reflux noted. No hiatal hernia. Standard barium tablet passes normally. Electronically Signed   By: Maisie Fus  Register   On: 02/05/2021 08:42     Assessment & Plan:   Chronic cough - Some improvement in cough on Breo 200. Still has coughing fits. No shortness of breath or chest tightness. He has associated reflux which he takes Protonix 40mg  once daily. CT imaging showed no acute abnormality or ILD. Recommend maximizing GERD therapy by adding Famotidine (Pepcid) 20mg  at bedtime. Also advised that he continue Singulair 10mg  at bedtime and consistently use Flonase nasal spray for possible PND contributing to cough. Needs pulmonary function testing as previously recommended and we will check respiratory allergy panel. Sending in RX for Dextromethorphan-guifenesin 72ml q 4 hours.   Recommendations:  Continue Breo 200 one puff daily in morning Continue Singulair 10mg  at bedtime Continue Protonix 40mg  daily; Add Pepcid 20mg  at bedtime Continue Neurontin 300mg  three times a day  Use flonase nasal spray every day Follow GERD diet. Avoid mint and menthol product Try sips of water and hard sugar free candies throughout the day to help suppress cough   Orders: Pulmonary function testing Resp allergy panel   Follow-up: 3 months with Dr.   40 mins spent on case, >50% face to face with patient    , NP 02/12/2021

## 2021-02-13 ENCOUNTER — Other Ambulatory Visit: Payer: Self-pay | Admitting: Internal Medicine

## 2021-02-14 ENCOUNTER — Other Ambulatory Visit: Payer: Self-pay

## 2021-02-14 ENCOUNTER — Other Ambulatory Visit
Admission: RE | Admit: 2021-02-14 | Discharge: 2021-02-14 | Disposition: A | Discharge status: Home / Self Care | Payer: Medicare Other | Source: Ambulatory Visit | Attending: Primary Care | Admitting: Primary Care

## 2021-02-14 DIAGNOSIS — Z01812 Encounter for preprocedural laboratory examination: Secondary | ICD-10-CM | POA: Insufficient documentation

## 2021-02-14 DIAGNOSIS — Z20822 Contact with and (suspected) exposure to covid-19: Secondary | ICD-10-CM | POA: Insufficient documentation

## 2021-02-14 LAB — SARS CORONAVIRUS 2 (TAT 6-24 HRS): SARS Coronavirus 2: NEGATIVE

## 2021-02-14 MED ORDER — FUROSEMIDE 40 MG PO TABS: 40.0000 mg | ORAL_TABLET | Freq: Every day | ORAL | 0 refills | Status: DC

## 2021-02-16 ENCOUNTER — Encounter: Payer: Self-pay | Admitting: Internal Medicine

## 2021-02-17 ENCOUNTER — Ambulatory Visit: Payer: Medicare Other | Attending: Primary Care

## 2021-02-17 ENCOUNTER — Other Ambulatory Visit: Payer: Self-pay

## 2021-02-17 DIAGNOSIS — R0600 Dyspnea, unspecified: Secondary | ICD-10-CM | POA: Insufficient documentation

## 2021-02-17 DIAGNOSIS — R059 Cough, unspecified: Secondary | ICD-10-CM | POA: Diagnosis not present

## 2021-02-17 MED ORDER — GABAPENTIN 300 MG PO CAPS: 300.0000 mg | ORAL_CAPSULE | Freq: Three times a day (TID) | ORAL | 0 refills | Status: DC

## 2021-02-17 MED ORDER — ALBUTEROL SULFATE (2.5 MG/3ML) 0.083% IN NEBU
2.5000 mg | INHALATION_SOLUTION | Freq: Once | RESPIRATORY_TRACT | Status: AC
  Administered 2021-02-17: 2.5 mg via RESPIRATORY_TRACT
  Filled 2021-02-17: qty 3

## 2021-02-17 NOTE — Progress Notes (Signed)
PFTs showed moderate restriction with profound BD response consistent with asthma. Continue BREO and Singulair. Still awaiting resp allergy panel. If still symptomatic could consider [employer]

## 2021-02-17 NOTE — Telephone Encounter (Signed)
Hi, can you please check if we refilled/ rx  these for this pt in the past? If so ok to refill  Thnx .

## 2021-02-20 LAB — ALLERGEN PANEL (27) + IGE
Alternaria Alternata IgE: <0.1 kU/L
Aspergillus Fumigatus IgE: 0.32 kU/L — AB
Bahia Grass IgE: <0.1 kU/L
Bermuda Grass IgE: <0.1 kU/L
Cat Dander IgE: <0.1 kU/L
Cedar, Mountain IgE: <0.1 kU/L
Cladosporium Herbarum IgE: 0.34 kU/L — AB
Cocklebur IgE: <0.1 kU/L
Cockroach, American IgE: <0.1 kU/L
Common Silver Birch IgE: <0.1 kU/L
D Farinae IgE: 0.28 kU/L — AB
D Pteronyssinus IgE: 0.27 kU/L — AB
Dog Dander IgE: <0.1 kU/L
Elm, American IgE: <0.1 kU/L
Hickory, White IgE: <0.1 kU/L
IgE (Immunoglobulin E), Serum: 12 IU/mL (ref 6–495)
Johnson Grass IgE: <0.1 kU/L
Kentucky Bluegrass IgE: <0.1 kU/L
Maple/Box Elder IgE: <0.1 kU/L
Mucor Racemosus IgE: <0.1 kU/L
Oak, White IgE: <0.1 kU/L
Penicillium Chrysogen IgE: <0.1 kU/L
Pigweed, Rough IgE: <0.1 kU/L
Plantain, English IgE: <0.1 kU/L
Ragweed, Short IgE: <0.1 kU/L
Setomelanomma Rostrat: <0.1 kU/L
Timothy Grass IgE: <0.1 kU/L
White Mulberry IgE: <0.1 kU/L

## 2021-02-20 NOTE — Progress Notes (Signed)
Patient had class 1 allergy to outdoor mold and fungus, house dust, dust mites

## 2021-02-23 ENCOUNTER — Encounter: Payer: Self-pay | Admitting: Internal Medicine

## 2021-02-24 ENCOUNTER — Other Ambulatory Visit: Payer: Self-pay

## 2021-02-24 DIAGNOSIS — E1051 Type 1 diabetes mellitus with diabetic peripheral angiopathy without gangrene: Secondary | ICD-10-CM

## 2021-02-24 MED ORDER — FREESTYLE LIBRE 2 SENSOR MISC: 1.0000 [IU] | 0 refills | Status: DC

## 2021-02-24 NOTE — Telephone Encounter (Signed)
Prescription for Freestyle libre sensors sent to [facility].

## 2021-03-07 ENCOUNTER — Other Ambulatory Visit: Payer: Self-pay

## 2021-03-07 ENCOUNTER — Ambulatory Visit
Admission: RE | Admit: 2021-03-07 | Discharge: 2021-03-07 | Disposition: A | Discharge status: Home / Self Care | Payer: Medicare Other | Source: Ambulatory Visit | Attending: Gastroenterology | Admitting: Gastroenterology

## 2021-03-07 DIAGNOSIS — R140 Abdominal distension (gaseous): Secondary | ICD-10-CM

## 2021-03-16 ENCOUNTER — Other Ambulatory Visit: Payer: Self-pay | Admitting: Internal Medicine

## 2021-03-16 ENCOUNTER — Encounter: Payer: Self-pay | Admitting: Internal Medicine

## 2021-03-17 ENCOUNTER — Other Ambulatory Visit: Payer: Self-pay | Admitting: Internal Medicine

## 2021-03-17 MED ORDER — FUROSEMIDE 40 MG PO TABS: 40.0000 mg | ORAL_TABLET | Freq: Every day | ORAL | 0 refills | Status: DC

## 2021-03-17 NOTE — Telephone Encounter (Signed)
Pt needs a virtual appt with me this week so we can go over this with him please let him know thnx.

## 2021-03-19 ENCOUNTER — Other Ambulatory Visit: Payer: Self-pay

## 2021-03-19 ENCOUNTER — Encounter: Payer: Self-pay | Admitting: Internal Medicine

## 2021-03-19 ENCOUNTER — Ambulatory Visit (INDEPENDENT_AMBULATORY_CARE_PROVIDER_SITE_OTHER): Payer: Medicare Other | Admitting: Internal Medicine

## 2021-03-19 VITALS — BP 158/79 | HR 106 | Temp 98.9°F | Ht 66.93 in | Wt 219.4 lb

## 2021-03-19 DIAGNOSIS — J45909 Unspecified asthma, uncomplicated: Secondary | ICD-10-CM | POA: Insufficient documentation

## 2021-03-19 DIAGNOSIS — E1065 Type 1 diabetes mellitus with hyperglycemia: Secondary | ICD-10-CM

## 2021-03-19 DIAGNOSIS — M79673 Pain in unspecified foot: Secondary | ICD-10-CM | POA: Insufficient documentation

## 2021-03-19 DIAGNOSIS — E7800 Pure hypercholesterolemia, unspecified: Secondary | ICD-10-CM

## 2021-03-19 DIAGNOSIS — IMO0002 Reserved for concepts with insufficient information to code with codable children: Secondary | ICD-10-CM

## 2021-03-19 DIAGNOSIS — E108 Type 1 diabetes mellitus with unspecified complications: Secondary | ICD-10-CM

## 2021-03-19 MED ORDER — METOPROLOL TARTRATE 25 MG PO TABS: 25.0000 mg | ORAL_TABLET | Freq: Two times a day (BID) | ORAL | 1 refills | Status: DC

## 2021-03-19 MED ORDER — PRAMIPEXOLE DIHYDROCHLORIDE 0.25 MG PO TABS: 0.2500 mg | ORAL_TABLET | Freq: Every evening | ORAL | 5 refills | Status: DC

## 2021-03-19 NOTE — Progress Notes (Signed)
BP (!) 158/79   Pulse (!) 106   Temp 98.9 F (37.2 C) (Oral)   Ht 5' 6.93" (1.7 m)   Wt 219 lb 6.4 oz (99.5 kg)   SpO2 98%   BMI 34.44 kg/m    Subjective:    Patient ID: Mike Kline, male    DOB: [birth year], 50 y.o.   MRN: [id number]  Chief Complaint  Patient presents with   Cough    Has cleared, also following up on imaging results.    Foot Injury    Has an open wound on right heel.    HPI: Mike Kline is a 50 y.o. male  Lower ext edema ? Vascular insuffiency -  Has a cut on the lateral part of the right foot, feet are very dry   Pt has RLS   Cough This is a chronic (much improved with breo, since last placed on such, PFts show moderate asthma. is on singulair as well.) problem. The current episode started more than 1 year ago. The problem has been gradually improving.  Foot Injury  The incident occurred more than 1 week ago. The pain is present in the right heel. The quality of the pain is described as aching. The pain is at a severity of 6/10. The pain is moderate. Pertinent negatives include no inability to bear weight, loss of motion, loss of sensation, muscle weakness, numbness or tingling.   Chief Complaint  Patient presents with   Cough    Has cleared, also following up on imaging results.    Foot Injury    Has an open wound on right heel.    Relevant past medical, surgical, family and social history reviewed and updated as indicated. Interim medical history since our last visit reviewed. Allergies and medications reviewed and updated.  Review of Systems  Respiratory:  Positive for cough.   Neurological:  Negative for tingling and numbness.   Per HPI unless specifically indicated above     Objective:    BP (!) 158/79   Pulse (!) 106   Temp 98.9 F (37.2 C) (Oral)   Ht 5' 6.93" (1.7 m)   Wt 219 lb 6.4 oz (99.5 kg)   SpO2 98%   BMI 34.44 kg/m   Wt Readings from Last 3 Encounters:  03/19/21 219 lb 6.4 oz (99.5 kg)  02/12/21 225 lb 12.8 oz  (102.4 kg)  02/06/21 227 lb 12.8 oz (103.3 kg)    Physical Exam Vitals and nursing note reviewed.  Constitutional:      General: He is not in acute distress.    Appearance: Normal appearance. He is not ill-appearing or diaphoretic.  HENT:     Head: Normocephalic and atraumatic.     Right Ear: Tympanic membrane and external ear normal. There is no impacted cerumen.     Left Ear: External ear normal.     Nose: No congestion or rhinorrhea.     Mouth/Throat:     Pharynx: No oropharyngeal exudate or posterior oropharyngeal erythema.  Eyes:     Conjunctiva/sclera: Conjunctivae normal.     Pupils: Pupils are equal, round, and reactive to light.  Cardiovascular:     Rate and Rhythm: Normal rate and regular rhythm.     Heart sounds: No murmur heard.   No friction rub. No gallop.  Pulmonary:     Effort: No respiratory distress.     Breath sounds: No stridor. No wheezing or rhonchi.  Chest:     Chest wall:  No tenderness.  Abdominal:     General: Abdomen is flat. Bowel sounds are normal.     Palpations: Abdomen is soft. There is no mass.     Tenderness: There is no abdominal tenderness.  Musculoskeletal:     Cervical back: Normal range of motion and neck supple. No rigidity or tenderness.     Left lower leg: No edema.  Skin:    General: Skin is warm and dry.  Neurological:     Mental Status: He is alert.    Results for orders placed or performed during the hospital encounter of 02/14/21  SARS CORONAVIRUS 2 (TAT 6-24 HRS) Nasopharyngeal Nasopharyngeal Swab   Specimen: Nasopharyngeal Swab  Result Value Ref Range   SARS Coronavirus 2 NEGATIVE NEGATIVE        Current Outpatient Medications:    albuterol (VENTOLIN HFA) 108 (90 Base) MCG/ACT inhaler, Inhale 2 puffs into the lungs every 6 (six) hours as needed for wheezing or shortness of breath (Cough)., Disp: 8 g, Rfl: 2   ALLERGY RELIEF 180 MG tablet, Take 1 tablet (180 mg total) by mouth daily., Disp: 30 tablet, Rfl: 0    amitriptyline (ELAVIL) 100 MG tablet, Take 1 tablet (100 mg total) by mouth at bedtime., Disp: 30 tablet, Rfl: 3   Continuous Blood Gluc Sensor (FREESTYLE LIBRE 2 SENSOR) MISC, 1 Units by Does not apply route every 14 (fourteen) days., Disp: 2 each, Rfl: 0   famotidine (PEPCID) 20 MG tablet, Take 1 tablet (20 mg total) by mouth at bedtime., Disp: 30 tablet, Rfl: 3   fluticasone furoate-vilanterol (BREO ELLIPTA) 200-25 MCG/INH AEPB, Inhale 1 puff into the lungs daily., Disp: 28 each, Rfl: 2   furosemide (LASIX) 40 MG tablet, Take 1 tablet (40 mg total) by mouth daily., Disp: 30 tablet, Rfl: 0   gabapentin (NEURONTIN) 300 MG capsule, Take 1 capsule (300 mg total) by mouth 3 (three) times daily., Disp: 270 capsule, Rfl: 0   HUMALOG KWIKPEN 100 UNIT/ML KiwkPen, USE THREE TIMES A DAY AS DIRECTED.(UP TO 50 UNITS A DAY OR AS DIRECTED BY SLIDING SCALE) (Patient taking differently: Inject 6-10 Units into the skin 3 (three) times daily.), Disp: 15 mL, Rfl: 6   fluticasone furoate-vilanterol (BREO ELLIPTA) 200-25 MCG/INH AEPB, Inhale 1 puff into the lungs daily., Disp: 28 each, Rfl: 11   insulin glargine (LANTUS SOLOSTAR) 100 UNIT/ML Solostar Pen, Inject 40 Units into the skin daily at 10 pm., Disp: , Rfl:    losartan (COZAAR) 25 MG tablet, Take 1 tablet (25 mg total) by mouth daily., Disp: 90 tablet, Rfl: 1   metoprolol tartrate (LOPRESSOR) 25 MG tablet, Take 1 tablet (25 mg total) by mouth 2 (two) times daily., Disp: 90 tablet, Rfl: 1   montelukast (SINGULAIR) 10 MG tablet, Take 1 tablet (10 mg total) by mouth at bedtime., Disp: 30 tablet, Rfl: 3   ondansetron (ZOFRAN) 8 MG tablet, Take 1 tablet (8 mg total) by mouth every 8 (eight) hours as needed for nausea or vomiting., Disp: 20 tablet, Rfl: 0   pantoprazole (PROTONIX) 40 MG tablet, Take 1 tablet (40 mg total) by mouth daily., Disp: 90 tablet, Rfl: 1   pramipexole (MIRAPEX) 0.125 MG tablet, Take 1 tablet (0.125 mg total) by mouth every evening., Disp: 90  tablet, Rfl: 0   sildenafil (VIAGRA) 50 MG tablet, Take 50 mg by mouth daily as needed., Disp: , Rfl:     Assessment & Plan:  Foot pain sec to callus and cut from dry skin  Use otc triple anbtibiotic.  No s.s of infection.  Will refer to podiatry asap.  To see vascular surg for insufficiency.  RLS:  Is gabapentin for such to taper to off was rx such for pain in chest.  Not every night  Increase mirapex to 0.25 mg.   DM stable. Sees endocrinology  10.5 last check takes lantus and humalog for such per endo. check HbA1c,  urine  microalbumin  diabetic diet plan given to pt  adviced regarding hypoglycemia and instructions given to pt today on how to prevent and treat the same if it were to occur. pt acknowledges the plan and voices understanding of the same.  exercise plan given and encouraged.   advice diabetic yearly podiatry, ophthalmology , nutritionist , dental check q 6 months,  Asthma ;Cough better no meds.  Gets breo for such per pulmonology Continue BREO and Singulair  Allergy panel shows a lot of allergies.   Problem List Items Addressed This Visit   None    Orders Placed This Encounter  Procedures   Ambulatory referral to Podiatry     Meds ordered this encounter  Medications   metoprolol tartrate (LOPRESSOR) 25 MG tablet    Sig: Take 1 tablet (25 mg total) by mouth 2 (two) times daily.    Dispense:  180 tablet    Refill:  1   pramipexole (MIRAPEX) 0.25 MG tablet    Sig: Take 1 tablet (0.25 mg total) by mouth every evening.    Dispense:  30 tablet    Refill:  5     Follow up plan: No follow-ups on file.

## 2021-03-24 ENCOUNTER — Other Ambulatory Visit (INDEPENDENT_AMBULATORY_CARE_PROVIDER_SITE_OTHER): Payer: Self-pay | Admitting: Nurse Practitioner

## 2021-03-24 DIAGNOSIS — I739 Peripheral vascular disease, unspecified: Secondary | ICD-10-CM

## 2021-03-26 ENCOUNTER — Ambulatory Visit (INDEPENDENT_AMBULATORY_CARE_PROVIDER_SITE_OTHER): Payer: Medicare Other

## 2021-03-26 ENCOUNTER — Other Ambulatory Visit: Payer: Self-pay

## 2021-03-26 ENCOUNTER — Encounter (INDEPENDENT_AMBULATORY_CARE_PROVIDER_SITE_OTHER): Payer: Self-pay | Admitting: Nurse Practitioner

## 2021-03-26 ENCOUNTER — Ambulatory Visit (INDEPENDENT_AMBULATORY_CARE_PROVIDER_SITE_OTHER): Payer: Medicare Other | Admitting: Nurse Practitioner

## 2021-03-26 VITALS — BP 150/75 | HR 86 | Resp 16 | Ht 67.0 in | Wt 222.4 lb

## 2021-03-26 DIAGNOSIS — N529 Male erectile dysfunction, unspecified: Secondary | ICD-10-CM

## 2021-03-26 DIAGNOSIS — M79673 Pain in unspecified foot: Secondary | ICD-10-CM | POA: Diagnosis not present

## 2021-03-26 DIAGNOSIS — I10 Essential (primary) hypertension: Secondary | ICD-10-CM

## 2021-03-26 DIAGNOSIS — M7989 Other specified soft tissue disorders: Secondary | ICD-10-CM

## 2021-03-26 DIAGNOSIS — I739 Peripheral vascular disease, unspecified: Secondary | ICD-10-CM | POA: Diagnosis not present

## 2021-03-26 NOTE — Progress Notes (Signed)
Subjective:    Patient ID: Mike Kline, male    DOB: 1970/10/24, 50 y.o.   MRN: [id number] Chief Complaint  Patient presents with  . New Patient (Initial Visit)    Ref Mike Kline    Mike Kline is a 50 year old man that presents to [place] as a referral from Dr. Ledell Kline in reference to concern for possible peripheral arterial disease and extensive lower extremity edema.  The patient has a history of coronary artery disease with a previous CABG.  The patient does note that he used his left great saphenous vein during this.  He is also type I diabetic.  The patient notes that he recently has had several issues ongoing with his legs.  He noted that several weeks ago he had severe swelling in his bilateral lower extremities and they became very red and painful.  He was diagnosed with cellulitis and after antibiotics the swelling decreased and the redness and pain resolved.  He also developed small cracks on his heels that are painful for him.  He notes that this is not uncommon for him but during this last incident they were slower to heal.  He currently has no open wounds or ulcerations.  The patient has not previously worn medical grade compression socks.  He has been on diuretics and he notes that they were more helpful previously for controlling his swelling but not as much now.  Today noninvasive studies show an ABI of 1.17 on the right and 1.23 on the left.  The patient has a TBI of 1.10 on the right and 0.77 on the left.  The patient has biphasic tibial artery waveforms bilaterally with good toe waveforms bilaterally.  The patient also underwent bilateral lower extremity arterial duplexes which essentially show biphasic/triphasic waveforms throughout the bilateral lower extremities.  It is noted that there is some moderate atherosclerosis throughout but nothing significant.   Review of Systems  Eyes:  Positive for visual disturbance.  Cardiovascular:  Positive for leg swelling.   Musculoskeletal:  Positive for gait problem.  All other systems reviewed and are negative.     Objective:   Physical Exam Vitals reviewed.  HENT:     Head: Normocephalic.  Cardiovascular:     Rate and Rhythm: Normal rate.     Comments: Nonpalpable pedal pulses Pulmonary:     Effort: Pulmonary effort is normal.  Musculoskeletal:     Right lower leg: 1+ Edema present.     Left lower leg: 1+ Edema present.  Skin:    General: Skin is warm and dry.     Capillary Refill: Capillary refill takes 2 to 3 seconds.  Neurological:     Mental Status: He is alert and oriented to person, place, and time.  Psychiatric:        Mood and Affect: Mood normal.        Behavior: Behavior normal.        Thought Content: Thought content normal.        Judgment: Judgment normal.    BP (!) 150/75 (BP Location: Right Arm)   Pulse 86   Resp 16   Ht 5\' 7"  (1.702 m)   Wt 222 lb 6.4 oz (100.9 kg)   BMI 34.83 kg/m   Past Medical History:  Diagnosis Date  . Adhesive capsulitis    permanent disability 07/2010, left shoulder  . Anginal pain (HCC)   . Blindness of left eye   . Diabetes mellitus without complication (HCC)   . Diabetic,  retinopathy, proliferative (HCC)   . GERD (gastroesophageal reflux disease)   . Hyperlipidemia   . Hypertension     Social History   Socioeconomic History  . Marital status: Divorced    Spouse name: Not on file  . Number of children: Not on file  . Years of education: Not on file  . Highest education level: Not on file  Occupational History  . Occupation: disability  Tobacco Use  . Smoking status: Never  . Smokeless tobacco: Never  . Tobacco comments:    Never  Vaping Use  . Vaping Use: Never used  Substance and Sexual Activity  . Alcohol use: Not Currently    Comment: socially  . Drug use: No  . Sexual activity: Not on file  Other Topics Concern  . Not on file  Social History Narrative  . Not on file   Social Determinants of Health    Financial Resource Strain: Low Risk   . Difficulty of Paying Living Expenses: Not hard at all  Food Insecurity: No Food Insecurity  . Worried About Programme researcher, broadcasting/film/videounning Out of Food in the Last Year: Never true  . Ran Out of Food in the Last Year: Never true  Transportation Needs: No Transportation Needs  . Lack of Transportation (Medical): No  . Lack of Transportation (Non-Medical): No  Physical Activity: Inactive  . Days of Exercise per Week: 0 days  . Minutes of Exercise per Session: 0 min  Stress: No Stress Concern Present  . Feeling of Stress : Not at all  Social Connections: Not on file  Intimate Partner Violence: Not on file    Past Surgical History:  Procedure Laterality Date  . APPENDECTOMY    . CABG x4  06/02/2019   [place]  . CARPAL TUNNEL RELEASE Bilateral   . cataract surgery Bilateral 2014  . EYE SURGERY Left   . LAA clip Left 06/02/2019   [place]  . LEFT HEART CATH AND CORONARY ANGIOGRAPHY Left 05/22/2019   Procedure: LEFT HEART CATH AND CORONARY ANGIOGRAPHY;  Surgeon: Mike Kline;  Location: [facility] INVASIVE CV LAB;  Service: Cardiovascular;  Laterality: Left;  . SHOULDER SURGERY Left 2009    Family History  Problem Relation Age of Onset  . Bone cancer Mother     No Known Allergies  CBC Latest Ref Rng & Units 02/05/2021 01/11/2021 10/23/2020  WBC 3.4 - 10.8 x10E3/uL 7.1 4.3 5.2  Hemoglobin 13.0 - 17.7 g/dL 11.5(L) 12.1(L) 12.2(L)  Hematocrit 37.5 - 51.0 % 36.7(L) 37.6(L) 38.4  Platelets 150 - 450 x10E3/uL 231 198 264      CMP     Component Value Date/Time   NA 134 (L) 01/11/2021 1935   NA 135 10/23/2020 0855   K 4.5 01/11/2021 1935   CL 103 01/11/2021 1935   CO2 23 01/11/2021 1935   GLUCOSE 232 (H) 01/11/2021 1935   BUN 17 01/11/2021 1935   BUN 19 10/23/2020 0855   CREATININE 1.14 01/11/2021 1935   CALCIUM 8.7 (L) 01/11/2021 1935   PROT 7.1 01/11/2021 1935   PROT 6.9 10/23/2020 0855   ALBUMIN 3.8 01/11/2021 1935   ALBUMIN 4.0 10/23/2020  0855   AST 34 01/11/2021 1935   AST 31 12/15/2016 1418   ALT 34 01/11/2021 1935   ALT 35 12/15/2016 1418   ALKPHOS 108 01/11/2021 1935   BILITOT 0.8 01/11/2021 1935   BILITOT 0.2 10/23/2020 0855   GFRNONAA >60 01/11/2021 1935   GFRAA 80 04/14/2019 1141  No results found.     Assessment & Plan:   1. Kline (peripheral vascular disease) (HCC) Based on the patient's noninvasive studies today, while his ABIs were normal he does have biphasic waveforms and a number of significant risk factors for developing worsening PAD.  The patient should likely be followed on an annual basis with ABIs with sooner as needed follow-up if he begins to develop wounds that will not heal or any other symptoms concerning for worsening PAD.  2. Heel pain, unspecified laterality The patient gets small cuts in his heels which are likely causing the heel pain.  Based on the noninvasive studies today patient likely have the perfusion to be able to heal these areas.  The swelling in the patient's legs may be a causative factor or something that causes delayed healing for him.  If we can get his edema under control this should also help with these scratches and tears he gets.  Hydration of the heel and foot would also be helpful as well.  3. Leg swelling I have had a long discussion with the patient regarding swelling and why it  causes symptoms.  Patient will begin wearing graduated compression stockings class 1 (20-30 mmHg) on a daily basis a prescription was given. The patient will  beginning wearing the stockings first thing in the morning and removing them in the evening. The patient is instructed specifically not to sleep in the stockings.   In addition, behavioral modification will be initiated.  This will include frequent elevation, use of over the counter pain medications and exercise such as walking.  I have reviewed systemic causes for chronic edema such as liver, kidney and cardiac etiologies.  The patient  denies problems with these organ systems.    Consideration for a lymph pump will also be made based upon the effectiveness of conservative therapy.  This would help to improve the edema control and prevent sequela such as ulcers and infections   Patient should undergo duplex ultrasound of the venous system to ensure that DVT or reflux is not present.  The patient will follow-up with me after the ultrasound.    4. Erectile dysfunction, unspecified erectile dysfunction type Arterial insufficiency can certainly be a causative factor for erectile dysfunction.  Based on the patient's noninvasive studies today all he does have some PAD it is not significant.  This decreases the likelihood that the erectile dysfunction is related to arterial insufficiency.  I have recommended that he discuss with his primary care physician who can work-up the patient for all possible causes erectile dysfunction.    5. Primary hypertension Continue antihypertensive medications as already ordered, these medications have been reviewed and there are no changes at this time.    Current Outpatient Medications on File Prior to Visit  Medication Sig Dispense Refill  . albuterol (VENTOLIN HFA) 108 (90 Base) MCG/ACT inhaler Inhale 2 puffs into the lungs every 6 (six) hours as needed for wheezing or shortness of breath (Cough). 8 g 2  . ALLERGY RELIEF 180 MG tablet Take 1 tablet (180 mg total) by mouth daily. 30 tablet 0  . amitriptyline (ELAVIL) 100 MG tablet Take 1 tablet (100 mg total) by mouth at bedtime. 30 tablet 3  . Continuous Blood Gluc Sensor (FREESTYLE LIBRE 2 SENSOR) MISC 1 Units by Does not apply route every 14 (fourteen) days. 2 each 0  . fluticasone furoate-vilanterol (BREO ELLIPTA) 200-25 MCG/INH AEPB Inhale 1 puff into the lungs daily. 28 each 11  . furosemide (  LASIX) 40 MG tablet Take 1 tablet (40 mg total) by mouth daily. 30 tablet 0  . HUMALOG KWIKPEN 100 UNIT/ML KiwkPen USE THREE TIMES A DAY AS DIRECTED.(UP  TO 50 UNITS A DAY OR AS DIRECTED BY SLIDING SCALE) (Patient taking differently: Inject 6-10 Units into the skin 3 (three) times daily.) 15 mL 6  . insulin glargine (LANTUS SOLOSTAR) 100 UNIT/ML Solostar Pen Inject 40 Units into the skin daily at 10 pm.    . losartan (COZAAR) 25 MG tablet Take 1 tablet (25 mg total) by mouth daily. 90 tablet 1  . metoprolol tartrate (LOPRESSOR) 25 MG tablet Take 1 tablet (25 mg total) by mouth 2 (two) times daily. 180 tablet 1  . ondansetron (ZOFRAN) 8 MG tablet Take 1 tablet (8 mg total) by mouth every 8 (eight) hours as needed for nausea or vomiting. 20 tablet 0  . pantoprazole (PROTONIX) 40 MG tablet Take 1 tablet (40 mg total) by mouth daily. 90 tablet 1  . pramipexole (MIRAPEX) 0.25 MG tablet Take 1 tablet (0.25 mg total) by mouth every evening. 30 tablet 5  . sildenafil (VIAGRA) 50 MG tablet Take 50 mg by mouth daily as needed.    . famotidine (PEPCID) 20 MG tablet Take 1 tablet (20 mg total) by mouth at bedtime. 30 tablet 3  . fluticasone furoate-vilanterol (BREO ELLIPTA) 200-25 MCG/INH AEPB Inhale 1 puff into the lungs daily. 28 each 2  . montelukast (SINGULAIR) 10 MG tablet Take 1 tablet (10 mg total) by mouth at bedtime. 30 tablet 3   No current facility-administered medications on file prior to visit.    There are no Patient Instructions on file for this visit. No follow-ups on file.   Georgiana Spinner, NP

## 2021-04-05 ENCOUNTER — Other Ambulatory Visit: Payer: Self-pay | Admitting: Internal Medicine

## 2021-04-05 DIAGNOSIS — E1051 Type 1 diabetes mellitus with diabetic peripheral angiopathy without gangrene: Secondary | ICD-10-CM

## 2021-04-05 NOTE — Telephone Encounter (Signed)
Requested medication (s) are due for refill today: yes  Requested medication (s) are on the active medication list: yes to Zofran   No to Bactrim  Last refill:  02/05/21 for bot requested meds  Future visit scheduled: yes  Notes to clinic:  Zofran: RF not delegated to NT to RF Bactrim not assigned to a protocol   Requested Prescriptions  Pending Prescriptions Disp Refills   ondansetron (ZOFRAN) 8 MG tablet [Pharmacy Med Name: ONDANSETRON 8 MG TAB] 20 tablet     Sig: TAKE ONE TABLET BY MOUTH EVERY 8 HOURS AS NEEDED FOR NAUSEA AND VOMITING     Not Delegated - Gastroenterology: Antiemetics Failed - 04/05/2021 11:43 AM      Failed - This refill cannot be delegated      Passed - Valid encounter within last 6 months    Recent Outpatient Visits           2 weeks ago Heel pain, unspecified laterality   [facility] Vigg, Avanti, MD   1 month ago Nausea and vomiting, intractability of vomiting not specified, unspecified vomiting type   [facility] Vigg, Avanti, MD   2 months ago Cough   [facility] Vigg, Avanti, MD   2 months ago Acute bronchitis, unspecified organism   [facility], Megan P, DO   4 months ago Chronic pain of both knees   [facility] Vigg, Avanti, MD       Future Appointments             In 2 weeks Vigg, Avanti, MD [facility]             sulfamethoxazole-trimethoprim (BACTRIM DS) 800-160 MG tablet [Pharmacy Med Name: SMZ-TMP DS TAB] 14 tablet     Sig: TAKE ONE TABLET BY MOUTH TWICE A DAY FOR 7 DAYS     Off-Protocol Failed - 04/05/2021 11:43 AM      Failed - Medication not assigned to a protocol, review manually.      Passed - Valid encounter within last 12 months    Recent Outpatient Visits           2 weeks ago Heel pain, unspecified laterality   [facility] Vigg, Avanti, MD   1 month ago Nausea and vomiting, intractability of vomiting not  specified, unspecified vomiting type   [facility] Vigg, Avanti, MD   2 months ago Cough   [facility] Vigg, Avanti, MD   2 months ago Acute bronchitis, unspecified organism   [facility], Megan P, DO   4 months ago Chronic pain of both knees   [facility] Vigg, Avanti, MD       Future Appointments             In 2 weeks Vigg, Avanti, MD [facility]            Signed Prescriptions Disp Refills   Continuous Blood Gluc Sensor (FREESTYLE LIBRE 2 SENSOR) MISC 2 each 0    Sig: PLACE ONE TO THE BACK OF YOUR UPPER ARM. REPLACE EVERY 14 DAYS.     Endocrinology: Diabetes - Testing Supplies Passed - 04/05/2021 11:43 AM      Passed - Valid encounter within last 12 months    Recent Outpatient Visits           2 weeks ago Heel pain, unspecified laterality   [facility] Vigg, Avanti, MD  1 month ago Nausea and vomiting, intractability of vomiting not specified, unspecified vomiting type   [facility], Avanti, MD   2 months ago Cough   [facility] Vigg, Avanti, MD   2 months ago Acute bronchitis, unspecified organism   [facility], Megan P, DO   4 months ago Chronic pain of both knees   [facility] Vigg, Avanti, MD       Future Appointments             In 2 weeks Vigg, Avanti, MD [facility], [facility]

## 2021-04-05 NOTE — Telephone Encounter (Signed)
Requested Prescriptions  Pending Prescriptions Disp Refills  . ondansetron (ZOFRAN) 8 MG tablet [Pharmacy Med Name: ONDANSETRON 8 MG TAB] 20 tablet     Sig: TAKE ONE TABLET BY MOUTH EVERY 8 HOURS AS NEEDED FOR NAUSEA AND VOMITING     Not Delegated - Gastroenterology: Antiemetics Failed - 04/05/2021 11:43 AM      Failed - This refill cannot be delegated      Passed - Valid encounter within last 6 months    Recent Outpatient Visits          2 weeks ago Heel pain, unspecified laterality   [facility] Vigg, Avanti, MD   1 month ago Nausea and vomiting, intractability of vomiting not specified, unspecified vomiting type   [facility] Vigg, Avanti, MD   2 months ago Cough   [facility] Vigg, Avanti, MD   2 months ago Acute bronchitis, unspecified organism   [facility], Megan P, DO   4 months ago Chronic pain of both knees   [facility] Vigg, Avanti, MD      Future Appointments            In 2 weeks Vigg, Avanti, MD [facility], [facility]           . sulfamethoxazole-trimethoprim (BACTRIM DS) 800-160 MG tablet [Pharmacy Med Name: SMZ-TMP DS TAB] 14 tablet     Sig: TAKE ONE TABLET BY MOUTH TWICE A DAY FOR 7 DAYS     Off-Protocol Failed - 04/05/2021 11:43 AM      Failed - Medication not assigned to a protocol, review manually.      Passed - Valid encounter within last 12 months    Recent Outpatient Visits          2 weeks ago Heel pain, unspecified laterality   [facility] Vigg, Avanti, MD   1 month ago Nausea and vomiting, intractability of vomiting not specified, unspecified vomiting type   [facility] Vigg, Avanti, MD   2 months ago Cough   [facility] Vigg, Avanti, MD   2 months ago Acute bronchitis, unspecified organism   [facility], Megan P, DO   4 months ago Chronic pain of both knees   [facility] Vigg, Avanti, MD       Future Appointments            In 2 weeks Vigg, Avanti, MD [facility], [facility]           . Continuous Blood Gluc Sensor (FREESTYLE LIBRE 2 SENSOR) MISC [Pharmacy Med Name: FREESTYLE LIBRE 2 SENSOR] 2 each 0    Sig: PLACE ONE TO THE BACK OF YOUR UPPER ARM. REPLACE EVERY 14 DAYS.     Endocrinology: Diabetes - Testing Supplies Passed - 04/05/2021 11:43 AM      Passed - Valid encounter within last 12 months    Recent Outpatient Visits          2 weeks ago Heel pain, unspecified laterality   [facility] Vigg, Avanti, MD   1 month ago Nausea and vomiting, intractability of vomiting not specified, unspecified vomiting type   [facility] Vigg, Avanti, MD   2 months ago Cough   [facility] Vigg, Avanti, MD   2 months ago Acute bronchitis, unspecified organism   [facility], Megan P, DO   4 months ago Chronic pain of both knees   [facility]  Loura Pardon, MD      Future Appointments            In 2 weeks Vigg, Avanti, MD [facility], [facility]

## 2021-04-06 ENCOUNTER — Encounter: Payer: Self-pay | Admitting: Internal Medicine

## 2021-04-07 NOTE — Telephone Encounter (Signed)
Patient will need an appointment for antibiotics.

## 2021-04-08 ENCOUNTER — Other Ambulatory Visit: Payer: Self-pay

## 2021-04-08 ENCOUNTER — Ambulatory Visit: Payer: Medicare Other | Admitting: Podiatry

## 2021-04-08 ENCOUNTER — Encounter: Payer: Self-pay | Admitting: Podiatry

## 2021-04-08 DIAGNOSIS — M79675 Pain in left toe(s): Secondary | ICD-10-CM

## 2021-04-08 DIAGNOSIS — B351 Tinea unguium: Secondary | ICD-10-CM

## 2021-04-08 DIAGNOSIS — Z79899 Other long term (current) drug therapy: Secondary | ICD-10-CM | POA: Diagnosis not present

## 2021-04-08 DIAGNOSIS — M79674 Pain in right toe(s): Secondary | ICD-10-CM | POA: Diagnosis not present

## 2021-04-08 MED ORDER — TERBINAFINE HCL 250 MG PO TABS: 250.0000 mg | ORAL_TABLET | Freq: Every day | ORAL | 0 refills | Status: DC

## 2021-04-08 NOTE — Progress Notes (Signed)
   Subjective: 50 y.o. male presenting today PMHx diabetes mellitus presenting today for evaluation of thick discolored thickened nails 1-5 bilateral.  He states that he also has very dry skin.  It is very aggravating and embarrassing he states.  He will not go to the pool this summer because of his toenails.  He would like to have them treated and evaluated.  Past Medical History:  Diagnosis Date   Adhesive capsulitis    permanent disability 07/2010, left shoulder   Anginal pain (HCC)    Blindness of left eye    Diabetes mellitus without complication (HCC)    Diabetic, retinopathy, proliferative (HCC)    GERD (gastroesophageal reflux disease)    Hyperlipidemia    Hypertension     Objective: Physical Exam General: The patient is alert and oriented x3 in no acute distress.  Dermatology: Hyperkeratotic, discolored, thickened, onychodystrophy noted 1-5 bilateral. Skin is warm, dry and supple bilateral lower extremities. Negative for open lesions or macerations.  There is some hyperkeratotic dry skin also noted to the bilateral feet  Vascular: History of venous stripping left with resulting venous reflux.  Delayed capillary refill bilateral lower extremities.  Neurological: Epicritic and protective threshold diminished bilaterally.   Musculoskeletal Exam: No pedal deformities noted  Assessment: #1 Onychomycosis of toenails bilateral 1-5 #2 diabetes mellitus  Plan of Care:  #1 Patient was evaluated. #2  Today we discussed different treatment options including oral, topical, and laser antifungal treatment modalities.  We discussed their efficacies and side effects.  Patient opts for oral antifungal treatment modality #3 prescription for Lamisil 250 mg #90 daily.  She denies a history of liver pathology or symptoms.  Patient is otherwise healthy #4 order placed for hepatic function panel  #5 eturn to clinic as needed   Felecia Shelling, DPM [facility]  Dr. Felecia Shelling, DPM    [address] [place], [place] [address]                Office [phone]  Fax [phone]

## 2021-04-09 LAB — HEPATIC FUNCTION PANEL
ALT: 34 IU/L (ref 0–44)
AST: 35 IU/L (ref 0–40)
Albumin: 4.4 g/dL (ref 3.8–4.9)
Alkaline Phosphatase: 148 IU/L — ABNORMAL HIGH (ref 44–121)
Bilirubin Total: 0.2 mg/dL (ref 0.0–1.2)
Bilirubin, Direct: <0.1 mg/dL (ref 0.00–0.40)
Total Protein: 7 g/dL (ref 6.0–8.5)

## 2021-04-13 ENCOUNTER — Other Ambulatory Visit: Payer: Self-pay | Admitting: Internal Medicine

## 2021-04-13 NOTE — Telephone Encounter (Signed)
Requested Prescriptions  Pending Prescriptions Disp Refills  . furosemide (LASIX) 40 MG tablet [Pharmacy Med Name: FUROSEMIDE 40 MG TAB[*]] 12 tablet 0    Sig: TAKE ONE TABLET BY MOUTH ONE TIME DAILY     Cardiovascular:  Diuretics - Loop Failed - 04/13/2021  1:54 AM      Failed - Ca in normal range and within 360 days    Calcium  Date Value Ref Range Status  01/11/2021 8.7 (L) 8.9 - 10.3 mg/dL Final         Failed - Na in normal range and within 360 days    Sodium  Date Value Ref Range Status  01/11/2021 134 (L) 135 - 145 mmol/L Final  10/23/2020 135 134 - 144 mmol/L Final         Failed - Last BP in normal range    BP Readings from Last 1 Encounters:  03/26/21 (!) 150/75         Passed - K in normal range and within 360 days    Potassium  Date Value Ref Range Status  01/11/2021 4.5 3.5 - 5.1 mmol/L Final         Passed - Cr in normal range and within 360 days    Creatinine, Ser  Date Value Ref Range Status  01/11/2021 1.14 0.61 - 1.24 mg/dL Final         Passed - Valid encounter within last 6 months    Recent Outpatient Visits          3 weeks ago Heel pain, unspecified laterality   [facility] Vigg, Avanti, MD   2 months ago Nausea and vomiting, intractability of vomiting not specified, unspecified vomiting type   [facility] Vigg, Avanti, MD   2 months ago Cough   [facility] Vigg, Avanti, MD   2 months ago Acute bronchitis, unspecified organism   [facility], Megan P, DO   5 months ago Chronic pain of both knees   [facility] Vigg, Avanti, MD      Future Appointments            In 1 week Vigg, Avanti, MD [facility]

## 2021-04-24 ENCOUNTER — Ambulatory Visit (INDEPENDENT_AMBULATORY_CARE_PROVIDER_SITE_OTHER): Payer: Medicare Other | Admitting: Internal Medicine

## 2021-04-24 ENCOUNTER — Other Ambulatory Visit: Payer: Self-pay

## 2021-04-24 ENCOUNTER — Encounter: Payer: Self-pay | Admitting: Internal Medicine

## 2021-04-24 VITALS — BP 107/60 | HR 73 | Temp 97.8°F | Ht 66.0 in | Wt 222.8 lb

## 2021-04-24 DIAGNOSIS — IMO0002 Reserved for concepts with insufficient information to code with codable children: Secondary | ICD-10-CM

## 2021-04-24 DIAGNOSIS — Z1211 Encounter for screening for malignant neoplasm of colon: Secondary | ICD-10-CM

## 2021-04-24 DIAGNOSIS — Z951 Presence of aortocoronary bypass graft: Secondary | ICD-10-CM | POA: Diagnosis not present

## 2021-04-24 DIAGNOSIS — E1065 Type 1 diabetes mellitus with hyperglycemia: Secondary | ICD-10-CM

## 2021-04-24 DIAGNOSIS — E108 Type 1 diabetes mellitus with unspecified complications: Secondary | ICD-10-CM

## 2021-04-24 DIAGNOSIS — Z23 Encounter for immunization: Secondary | ICD-10-CM

## 2021-04-24 DIAGNOSIS — E7800 Pure hypercholesterolemia, unspecified: Secondary | ICD-10-CM

## 2021-04-24 MED ORDER — MONTELUKAST SODIUM 10 MG PO TABS
10.0000 mg | ORAL_TABLET | Freq: Every day | ORAL | 3 refills | Status: DC
Start: 2021-04-24 — End: 2021-09-03

## 2021-04-24 NOTE — Progress Notes (Signed)
BP 107/60   Pulse 73   Temp 97.8 F (36.6 C) (Oral)   Ht 5\' 6"  (1.676 m)   Wt 222 lb 12.8 oz (101.1 kg)   SpO2 98%   BMI 35.96 kg/m    Subjective:    Patient ID: , male    DOB: [birth year], 50 y.o.   MRN: [id number]  Chief Complaint  Patient presents with   Diabetes   Hypertension    HPI: Mike Kline is a 50 y.o. male  Diabetes Pertinent negatives for hypoglycemia include no confusion, dizziness, headaches, nervousness/anxiousness or speech difficulty. Pertinent negatives for diabetes include no chest pain, no fatigue, no polydipsia, no polyphagia, no polyuria and no weakness.  Hypertension This is a chronic problem. Pertinent negatives include no chest pain, headaches, palpitations or shortness of breath.  Asthma There is no chest tightness, cough, hoarse voice, shortness of breath or wheezing. Pertinent negatives include no appetite change, chest pain, fever or headaches. His past medical history is significant for asthma.   Chief Complaint  Patient presents with   Diabetes   Hypertension    Relevant past medical, surgical, family and social history reviewed and updated as indicated. Interim medical history since our last visit reviewed. Allergies and medications reviewed and updated.  Review of Systems  Constitutional:  Negative for activity change, appetite change, chills, fatigue and fever.  HENT:  Negative for congestion and hoarse voice.   Eyes:  Negative for visual disturbance.  Respiratory:  Negative for apnea, cough, chest tightness, shortness of breath and wheezing.   Cardiovascular:  Negative for chest pain, palpitations and leg swelling.  Gastrointestinal:  Negative for abdominal distention, abdominal pain, diarrhea and nausea.  Endocrine: Negative for cold intolerance, heat intolerance, polydipsia, polyphagia and polyuria.  Genitourinary:  Negative for difficulty urinating, dysuria, frequency, hematuria and urgency.  Skin:  Negative for  color change and rash.  Neurological:  Negative for dizziness, speech difficulty, weakness, light-headedness, numbness and headaches.  Psychiatric/Behavioral:  Negative for behavioral problems and confusion. The patient is not nervous/anxious.    Per HPI unless specifically indicated above     Objective:    BP 107/60   Pulse 73   Temp 97.8 F (36.6 C) (Oral)   Ht 5\' 6"  (1.676 m)   Wt 222 lb 12.8 oz (101.1 kg)   SpO2 98%   BMI 35.96 kg/m   Wt Readings from Last 3 Encounters:  04/24/21 222 lb 12.8 oz (101.1 kg)  03/26/21 222 lb 6.4 oz (100.9 kg)  03/19/21 219 lb 6.4 oz (99.5 kg)    Physical Exam Vitals and nursing note reviewed.  Constitutional:      General: He is not in acute distress.    Appearance: Normal appearance. He is not ill-appearing or diaphoretic.  HENT:     Head: Normocephalic and atraumatic.     Right Ear: Tympanic membrane and external ear normal. There is no impacted cerumen.     Left Ear: External ear normal.     Nose: No congestion or rhinorrhea.     Mouth/Throat:     Pharynx: No oropharyngeal exudate or posterior oropharyngeal erythema.  Eyes:     Conjunctiva/sclera: Conjunctivae normal.     Pupils: Pupils are equal, round, and reactive to light.  Cardiovascular:     Rate and Rhythm: Normal rate and regular rhythm.     Heart sounds: No murmur heard.   No friction rub. No gallop.  Pulmonary:     Effort: No respiratory  distress.     Breath sounds: No stridor. No wheezing or rhonchi.  Chest:     Chest wall: No tenderness.  Abdominal:     General: Abdomen is flat. Bowel sounds are normal.     Palpations: Abdomen is soft. There is no mass.     Tenderness: There is no abdominal tenderness.  Musculoskeletal:     Cervical back: Normal range of motion and neck supple. No rigidity or tenderness.     Left lower leg: No edema.  Skin:    General: Skin is warm and dry.     Coloration: Skin is not jaundiced or pale.     Findings: No erythema.   Neurological:     Mental Status: He is alert.    Results for orders placed or performed in visit on 04/08/21  Hepatic Function Panel  Result Value Ref Range   Total Protein 7.0 6.0 - 8.5 g/dL   Albumin 4.4 3.8 - 4.9 g/dL   Bilirubin Total 0.2 0.0 - 1.2 mg/dL   Bilirubin, Direct <0.17 0.00 - 0.40 mg/dL   Alkaline Phosphatase 148 (H) 44 - 121 IU/L   AST 35 0 - 40 IU/L   ALT 34 0 - 44 IU/L        Current Outpatient Medications:    albuterol (VENTOLIN HFA) 108 (90 Base) MCG/ACT inhaler, Inhale 2 puffs into the lungs every 6 (six) hours as needed for wheezing or shortness of breath (Cough)., Disp: 8 g, Rfl: 2   ALLERGY RELIEF 180 MG tablet, Take 1 tablet (180 mg total) by mouth daily., Disp: 30 tablet, Rfl: 0   amitriptyline (ELAVIL) 100 MG tablet, Take 1 tablet (100 mg total) by mouth at bedtime., Disp: 30 tablet, Rfl: 3   Continuous Blood Gluc Sensor (FREESTYLE LIBRE 2 SENSOR) MISC, PLACE ONE TO THE BACK OF YOUR UPPER ARM. REPLACE EVERY 14 DAYS., Disp: 2 each, Rfl: 0   famotidine (PEPCID) 20 MG tablet, Take 1 tablet (20 mg total) by mouth at bedtime., Disp: 30 tablet, Rfl: 3   fluticasone furoate-vilanterol (BREO ELLIPTA) 200-25 MCG/INH AEPB, Inhale 1 puff into the lungs daily., Disp: 28 each, Rfl: 2   furosemide (LASIX) 40 MG tablet, TAKE ONE TABLET BY MOUTH ONE TIME DAILY, Disp: 12 tablet, Rfl: 0   HUMALOG KWIKPEN 100 UNIT/ML KiwkPen, USE THREE TIMES A DAY AS DIRECTED.(UP TO 50 UNITS A DAY OR AS DIRECTED BY SLIDING SCALE) (Patient taking differently: Inject 6-10 Units into the skin 3 (three) times daily.), Disp: 15 mL, Rfl: 6   insulin glargine (LANTUS SOLOSTAR) 100 UNIT/ML Solostar Pen, Inject 40 Units into the skin daily at 10 pm., Disp: , Rfl:    losartan (COZAAR) 25 MG tablet, Take 1 tablet (25 mg total) by mouth daily., Disp: 90 tablet, Rfl: 1   metoprolol tartrate (LOPRESSOR) 25 MG tablet, Take 1 tablet (25 mg total) by mouth 2 (two) times daily., Disp: 180 tablet, Rfl: 1    ondansetron (ZOFRAN) 8 MG tablet, TAKE ONE TABLET BY MOUTH EVERY 8 HOURS AS NEEDED FOR NAUSEA AND VOMITING, Disp: 20 tablet, Rfl: 0   pantoprazole (PROTONIX) 40 MG tablet, Take 1 tablet (40 mg total) by mouth daily., Disp: 90 tablet, Rfl: 1   pramipexole (MIRAPEX) 0.25 MG tablet, Take 1 tablet (0.25 mg total) by mouth every evening., Disp: 30 tablet, Rfl: 5   sildenafil (VIAGRA) 50 MG tablet, Take 50 mg by mouth daily as needed., Disp: , Rfl:    terbinafine (LAMISIL) 250 MG tablet,  Take 1 tablet (250 mg total) by mouth daily., Disp: 90 tablet, Rfl: 0    Assessment & Plan:  Chronic cough :Sec to asthma  Is on singulair / breo for such    DM seeing endocrinology for such has type 1  check HbA1c,  urine  microalbumin  diabetic diet plan given to pt  adviced regarding hypoglycemia and instructions given to pt today on how to prevent and treat the same if it were to occur. pt acknowledges the plan and voices understanding of the same.  exercise plan given and encouraged.   advice diabetic yearly podiatry, ophthalmology , nutritionist , dental check q 6 months,  HTN  Continue current meds.  Medication compliance emphsed. pt advised to keep Bp logs. Pt verbalised understanding of the same. Pt to have a low salt diet . Exercise to reach a goal of at least 150 mins a week.  lifestyle modifications explained and pt understands importance of the above.   Onychomosis s/p lamisil for such   RLS is on mirapex 0.25  stable. No more restless  Failed  gabapentin  Foot pain :stable. Diabetic lotion jergen sec to callus and cut from dry skin  To see podiatry for such  To see vascular surg for insufficiency.     Problem List Items Addressed This Visit   None Visit Diagnoses     Need for influenza vaccination    -  Primary   Relevant Orders   Flu Vaccine QUAD 51mo+IM (Fluarix, Fluzone & Alfiuria Quad PF)        Orders Placed This Encounter  Procedures   Flu Vaccine QUAD 45mo+IM (Fluarix, Fluzone  & Alfiuria Quad PF)     No orders of the defined types were placed in this encounter.    Follow up plan: No follow-ups on file.  Health Maintenance :  Cscope : sees Dr. Karie Schwalbe was uspposed to have a csope / endoscopy to fu with them. Pneumonia vaccine :2022  FLu vaccine : today

## 2021-04-25 ENCOUNTER — Other Ambulatory Visit: Payer: Self-pay | Admitting: Internal Medicine

## 2021-04-25 NOTE — Telephone Encounter (Signed)
Requested medication (s) are due for refill today:  No  Requested medication (s) are on the active medication list:   No  Future visit scheduled:   Yes   Last ordered: Not on medication list but a request came in for the Bactrim DS reason returned.  No protocol assigned to antibiotics.   Requested Prescriptions  Pending Prescriptions Disp Refills   sulfamethoxazole-trimethoprim (BACTRIM DS) 800-160 MG tablet [Pharmacy Med Name: SMZ-TMP DS TAB] 14 tablet 0    Sig: TAKE ONE TABLET BY MOUTH TWICE A DAY FOR 7 DAYS     Off-Protocol Failed - 04/25/2021  1:54 AM      Failed - Medication not assigned to a protocol, review manually.      Passed - Valid encounter within last 12 months    Recent Outpatient Visits           Yesterday Need for influenza vaccination   [facility] Vigg, Avanti, MD   1 month ago Heel pain, unspecified laterality   [facility] Vigg, Avanti, MD   2 months ago Nausea and vomiting, intractability of vomiting not specified, unspecified vomiting type   [facility] Vigg, Avanti, MD   3 months ago Cough   [facility] Vigg, Avanti, MD   3 months ago Acute bronchitis, unspecified organism   [facility] [place], Megan P, DO       Future Appointments             In 3 months Vigg, Avanti, MD [facility], [facility]

## 2021-04-26 ENCOUNTER — Other Ambulatory Visit: Payer: Self-pay

## 2021-04-26 DIAGNOSIS — Z125 Encounter for screening for malignant neoplasm of prostate: Secondary | ICD-10-CM

## 2021-04-26 DIAGNOSIS — IMO0002 Reserved for concepts with insufficient information to code with codable children: Secondary | ICD-10-CM

## 2021-04-26 DIAGNOSIS — E7800 Pure hypercholesterolemia, unspecified: Secondary | ICD-10-CM

## 2021-04-26 DIAGNOSIS — E1065 Type 1 diabetes mellitus with hyperglycemia: Secondary | ICD-10-CM

## 2021-04-26 DIAGNOSIS — Z1329 Encounter for screening for other suspected endocrine disorder: Secondary | ICD-10-CM

## 2021-04-26 NOTE — Progress Notes (Signed)
Future lab orders placed

## 2021-05-11 ENCOUNTER — Encounter: Payer: Self-pay | Admitting: Internal Medicine

## 2021-05-12 ENCOUNTER — Other Ambulatory Visit: Payer: Self-pay

## 2021-05-12 MED ORDER — FAMOTIDINE 20 MG PO TABS: 20.0000 mg | ORAL_TABLET | Freq: Every day | ORAL | 0 refills | Status: DC

## 2021-05-13 ENCOUNTER — Other Ambulatory Visit: Payer: Self-pay | Admitting: Internal Medicine

## 2021-05-13 NOTE — Telephone Encounter (Signed)
Requested medication (s) are due for refill today:   Not sure   Requested medication (s) are on the active medication list:   No the gabapentin and rosuvastatin are not on list  Future visit scheduled:   Yes   Last ordered: Elavil 10/17/2020 #30, 3 refills;  gabapentin 300 mg discontinued 03/19/2021;  Lasix 40 mg is filled by his cardiologist;   rosuvastatin 40 mg is not on his list.  Returned because wasn't sure what medications he is supposed to be on or not.   Wasn't able to tell from the OV notes from 04/24/2021 with Dr. Charlotta Newton.   Requested Prescriptions  Pending Prescriptions Disp Refills   amitriptyline (ELAVIL) 100 MG tablet [Pharmacy Med Name: AMITRIPTYLINE 100 MG TAB] 30 tablet 3    Sig: TAKE ONE TABLET BY MOUTH AT BEDTIME     Psychiatry:  Antidepressants - Heterocyclics (TCAs) Passed - 05/13/2021  1:53 AM      Passed - Completed PHQ-2 or PHQ-9 in the last 360 days      Passed - Valid encounter within last 6 months    Recent Outpatient Visits           2 weeks ago Need for influenza vaccination   [facility] Vigg, Avanti, MD   1 month ago Heel pain, unspecified laterality   [facility] Vigg, Avanti, MD   3 months ago Nausea and vomiting, intractability of vomiting not specified, unspecified vomiting type   [facility] Vigg, Avanti, MD   3 months ago Cough   [facility] Vigg, Avanti, MD   3 months ago Acute bronchitis, unspecified organism   [employer], Megan P, DO       Future Appointments             In 2 months Vigg, Avanti, MD [facility], [facility]             gabapentin (NEURONTIN) 300 MG capsule [Pharmacy Med Name: GABAPENTIN 300 MG CAP[*]] 270 capsule 0    Sig: TAKE ONE CAPSULE BY MOUTH THREE TIMES A DAY     Neurology: Anticonvulsants - gabapentin Passed - 05/13/2021  1:53 AM      Passed - Valid encounter within last 12 months    Recent Outpatient Visits           2 weeks  ago Need for influenza vaccination   [facility] Vigg, Avanti, MD   1 month ago Heel pain, unspecified laterality   [facility] Vigg, Avanti, MD   3 months ago Nausea and vomiting, intractability of vomiting not specified, unspecified vomiting type   [facility] Vigg, Avanti, MD   3 months ago Cough   [facility] Vigg, Avanti, MD   3 months ago Acute bronchitis, unspecified organism   [employer], [place], DO       Future Appointments             In 2 months Vigg, Avanti, MD [facility], [facility]             furosemide (LASIX) 40 MG tablet [Pharmacy Med Name: FUROSEMIDE 40 MG TAB[*]] 12 tablet 0    Sig: TAKE ONE TABLET BY MOUTH ONE TIME DAILY     Cardiovascular:  Diuretics - Loop Failed - 05/13/2021  1:53 AM      Failed - Ca in normal range and within 360 days    Calcium  Date Value Ref  Range Status  01/11/2021 8.7 (L) 8.9 - 10.3 mg/dL Final          Failed - Na in normal range and within 360 days    Sodium  Date Value Ref Range Status  01/11/2021 134 (L) 135 - 145 mmol/L Final  10/23/2020 135 134 - 144 mmol/L Final          Passed - K in normal range and within 360 days    Potassium  Date Value Ref Range Status  01/11/2021 4.5 3.5 - 5.1 mmol/L Final          Passed - Cr in normal range and within 360 days    Creatinine, Ser  Date Value Ref Range Status  01/11/2021 1.14 0.61 - 1.24 mg/dL Final          Passed - Last BP in normal range    BP Readings from Last 1 Encounters:  04/24/21 107/60          Passed - Valid encounter within last 6 months    Recent Outpatient Visits           2 weeks ago Need for influenza vaccination   [facility] Vigg, Avanti, MD   1 month ago Heel pain, unspecified laterality   [facility] Vigg, Avanti, MD   3 months ago Nausea and vomiting, intractability of vomiting not specified, unspecified vomiting type    [facility] Vigg, Avanti, MD   3 months ago Cough   [facility] Vigg, Avanti, MD   3 months ago Acute bronchitis, unspecified organism   [facility] [place], Megan P, DO       Future Appointments             In 2 months Vigg, Avanti, MD [facility], [facility]             rosuvastatin (CRESTOR) 40 MG tablet [Pharmacy Med Name: ROSUVASTATIN 40 MG TAB[*]] 90 tablet 1    Sig: TAKE ONE TABLET BY MOUTH ONE TIME DAILY     Cardiovascular:  Antilipid - Statins Failed - 05/13/2021  1:53 AM      Failed - HDL in normal range and within 360 days    HDL  Date Value Ref Range Status  10/23/2020 38 (L) >39 mg/dL Final          Passed - Total Cholesterol in normal range and within 360 days    Cholesterol, Total  Date Value Ref Range Status  10/23/2020 109 100 - 199 mg/dL Final   Cholesterol Piccolo, Waived  Date Value Ref Range Status  12/15/2016 103 <200 mg/dL Final    Comment:                            Desirable                <200                         Borderline High      200- 239                         High                     >239           Passed - LDL in normal range and within 360  days    LDL Chol Calc (NIH)  Date Value Ref Range Status  10/23/2020 48 0 - 99 mg/dL Final          Passed - Triglycerides in normal range and within 360 days    Triglycerides  Date Value Ref Range Status  10/23/2020 126 0 - 149 mg/dL Final   Triglycerides Piccolo,Waived  Date Value Ref Range Status  12/15/2016 85 <150 mg/dL Final    Comment:                            Normal                   <150                         Borderline High     150 - 199                         High                200 - 499                         Very High                >499           Passed - Patient is not pregnant      Passed - Valid encounter within last 12 months    Recent Outpatient Visits           2 weeks ago Need for influenza  vaccination   [facility] Vigg, Avanti, MD   1 month ago Heel pain, unspecified laterality   [facility] Vigg, Avanti, MD   3 months ago Nausea and vomiting, intractability of vomiting not specified, unspecified vomiting type   [facility] Vigg, Avanti, MD   3 months ago Cough   [facility] Vigg, Avanti, MD   3 months ago Acute bronchitis, unspecified organism   [employer] [place], Megan P, DO       Future Appointments             In 2 months Vigg, Avanti, MD [facility], [facility]

## 2021-05-23 ENCOUNTER — Other Ambulatory Visit: Payer: Self-pay | Admitting: Internal Medicine

## 2021-05-23 NOTE — Telephone Encounter (Addendum)
[facility] called and spoke to Tristin, [facility] about the refill(s) Amitriptyline 100 mg requested. Advised it was sent on 05/13/21 #30/3 refill(s). She says the last one she was received on 10/17/20. Advised the refill will be sent.  Requested Prescriptions  Pending Prescriptions Disp Refills   amitriptyline (ELAVIL) 100 MG tablet [Pharmacy Med Name: AMITRIPTYLINE 100 MG TAB] 30 tablet 3    Sig: TAKE ONE TABLET BY MOUTH AT BEDTIME     Psychiatry:  Antidepressants - Heterocyclics (TCAs) Passed - 05/23/2021 12:04 AM      Passed - Completed PHQ-2 or PHQ-9 in the last 360 days      Passed - Valid encounter within last 6 months    Recent Outpatient Visits           4 weeks ago Need for influenza vaccination   [facility] Vigg, Avanti, MD   2 months ago Heel pain, unspecified laterality   [facility] Vigg, Avanti, MD   3 months ago Nausea and vomiting, intractability of vomiting not specified, unspecified vomiting type   [facility] Vigg, Avanti, MD   4 months ago Cough   [facility] Vigg, Avanti, MD   4 months ago Acute bronchitis, unspecified organism   [facility] [place], Megan P, DO       Future Appointments             In 2 months Vigg, Avanti, MD [facility]

## 2021-06-12 ENCOUNTER — Other Ambulatory Visit: Payer: Self-pay | Admitting: Internal Medicine

## 2021-06-12 NOTE — Telephone Encounter (Signed)
Fyi.

## 2021-06-12 NOTE — Telephone Encounter (Signed)
Requested medications are due for refill today. Unsure  Requested medications are on the active medications list.  yes  Last refill. 05/13/2021  Future visit scheduled.   yes  Notes to clinic.  Failed protocol d/t labs.

## 2021-06-12 NOTE — Telephone Encounter (Signed)
Requested medications are due for refill today yes  Requested medications are on the active medication list yes  Last refill yes  Last visit was seen 9/15 but GERD not addressed, last addressed 07/02/20  Future visit scheduled YES 07/24/21  Notes to clinic Has already had a curtesy refill there [place] upcoming appointment scheduled.  Requested Prescriptions  Pending Prescriptions Disp Refills   famotidine (PEPCID) 20 MG tablet [Pharmacy Med Name: FAMOTIDINE 20 MG TAB[*]] 30 tablet 0    Sig: TAKE ONE TABLET BY MOUTH AT BEDTIME     Gastroenterology:  H2 Antagonists Passed - 06/12/2021  2:37 PM      Passed - Valid encounter within last 12 months    Recent Outpatient Visits           1 month ago Need for influenza vaccination   [facility] Vigg, Avanti, MD   2 months ago Heel pain, unspecified laterality   [facility] Vigg, Avanti, MD   4 months ago Nausea and vomiting, intractability of vomiting not specified, unspecified vomiting type   [facility] Vigg, Avanti, MD   4 months ago Cough   [facility] Vigg, Avanti, MD   4 months ago Acute bronchitis, unspecified organism   [facility] [place], [place], DO       Future Appointments             In 1 month Vigg, Avanti, MD [facility], [facility]

## 2021-06-13 NOTE — Telephone Encounter (Signed)
Patient has appointment 07/24/21

## 2021-06-20 ENCOUNTER — Other Ambulatory Visit: Payer: Self-pay | Admitting: Internal Medicine

## 2021-06-20 DIAGNOSIS — E1051 Type 1 diabetes mellitus with diabetic peripheral angiopathy without gangrene: Secondary | ICD-10-CM

## 2021-06-20 NOTE — Telephone Encounter (Signed)
Requested Prescriptions  Pending Prescriptions Disp Refills  . famotidine (PEPCID) 20 MG tablet [Pharmacy Med Name: FAMOTIDINE 20 MG TAB[*]] 30 tablet 0    Sig: TAKE ONE TABLET BY MOUTH AT BEDTIME     Gastroenterology:  H2 Antagonists Passed - 06/20/2021  1:57 AM      Passed - Valid encounter within last 12 months    Recent Outpatient Visits          1 month ago Need for influenza vaccination   [facility] Vigg, Avanti, MD   3 months ago Heel pain, unspecified laterality   [facility] Vigg, Avanti, MD   4 months ago Nausea and vomiting, intractability of vomiting not specified, unspecified vomiting type   [facility] Vigg, Avanti, MD   5 months ago Cough   [facility] Vigg, Avanti, MD   5 months ago Acute bronchitis, unspecified organism   [facility] [place], Megan P, DO      Future Appointments            In 1 month Vigg, Avanti, MD [facility], [facility]           . Continuous Blood Gluc Sensor (FREESTYLE LIBRE 2 SENSOR) MISC [Pharmacy Med Name: FREESTYLE LIBRE 2 SENSOR] 2 each 0    Sig: PLACE 1 TO THE BACK OF YOUR UPPER ARM, REPLACE EVERY 14 DAYS     Endocrinology: Diabetes - Testing Supplies Passed - 06/20/2021  1:57 AM      Passed - Valid encounter within last 12 months    Recent Outpatient Visits          1 month ago Need for influenza vaccination   [facility] Vigg, Avanti, MD   3 months ago Heel pain, unspecified laterality   [facility] Vigg, Avanti, MD   4 months ago Nausea and vomiting, intractability of vomiting not specified, unspecified vomiting type   [facility] Vigg, Avanti, MD   5 months ago Cough   [facility] Vigg, Avanti, MD   5 months ago Acute bronchitis, unspecified organism   [facility] [place], [place], DO      Future Appointments            In 1 month Vigg, Avanti, MD [facility], [facility]

## 2021-06-20 NOTE — Telephone Encounter (Signed)
Requested by interface [email or web address]. Signed 06/16/21 at 0901 am. Receipt confirmed by pharmacy 06/16/21 at 9:01 am.   Requested Prescriptions  Signed Prescriptions Disp Refills   Continuous Blood Gluc Sensor (FREESTYLE LIBRE 2 SENSOR) MISC 2 each 0    Sig: PLACE 1 TO THE BACK OF YOUR UPPER ARM, REPLACE EVERY 14 DAYS     Endocrinology: Diabetes - Testing Supplies Passed - 06/20/2021  1:57 AM      Passed - Valid encounter within last 12 months    Recent Outpatient Visits          1 month ago Need for influenza vaccination   [facility] Vigg, Avanti, MD   3 months ago Heel pain, unspecified laterality   [facility] Vigg, Avanti, MD   4 months ago Nausea and vomiting, intractability of vomiting not specified, unspecified vomiting type   [facility] Vigg, Avanti, MD   5 months ago Cough   [facility] Vigg, Avanti, MD   5 months ago Acute bronchitis, unspecified organism   [facility] [place], [place], DO      Future Appointments            In 1 month Vigg, Avanti, MD [facility], [facility]           Refused Prescriptions Disp Refills  . famotidine (PEPCID) 20 MG tablet [Pharmacy Med Name: FAMOTIDINE 20 MG TAB[*]] 30 tablet 0    Sig: TAKE ONE TABLET BY MOUTH AT BEDTIME     Gastroenterology:  H2 Antagonists Passed - 06/20/2021  1:57 AM      Passed - Valid encounter within last 12 months    Recent Outpatient Visits          1 month ago Need for influenza vaccination   [facility] Vigg, Avanti, MD   3 months ago Heel pain, unspecified laterality   [facility] Vigg, Avanti, MD   4 months ago Nausea and vomiting, intractability of vomiting not specified, unspecified vomiting type   [facility] Vigg, Avanti, MD   5 months ago Cough   [facility] Vigg, Avanti, MD   5 months ago Acute bronchitis, unspecified organism   [facility] [place], [place],  DO      Future Appointments            In 1 month Vigg, Avanti, MD [facility], [facility]

## 2021-06-26 ENCOUNTER — Other Ambulatory Visit (INDEPENDENT_AMBULATORY_CARE_PROVIDER_SITE_OTHER): Payer: Self-pay | Admitting: Nurse Practitioner

## 2021-06-26 ENCOUNTER — Encounter (INDEPENDENT_AMBULATORY_CARE_PROVIDER_SITE_OTHER): Payer: Medicare Other | Admitting: Vascular Surgery

## 2021-06-26 ENCOUNTER — Ambulatory Visit (INDEPENDENT_AMBULATORY_CARE_PROVIDER_SITE_OTHER): Payer: Medicare Other

## 2021-06-26 ENCOUNTER — Other Ambulatory Visit: Payer: Self-pay

## 2021-06-26 ENCOUNTER — Ambulatory Visit (INDEPENDENT_AMBULATORY_CARE_PROVIDER_SITE_OTHER): Payer: Medicare Other | Admitting: Nurse Practitioner

## 2021-06-26 VITALS — BP 124/72 | HR 83 | Ht 67.0 in | Wt 215.0 lb

## 2021-06-26 DIAGNOSIS — M7989 Other specified soft tissue disorders: Secondary | ICD-10-CM

## 2021-06-26 DIAGNOSIS — I10 Essential (primary) hypertension: Secondary | ICD-10-CM | POA: Diagnosis not present

## 2021-06-29 ENCOUNTER — Encounter (INDEPENDENT_AMBULATORY_CARE_PROVIDER_SITE_OTHER): Payer: Self-pay | Admitting: Nurse Practitioner

## 2021-06-29 NOTE — Progress Notes (Signed)
Subjective:    Patient ID: Mike Kline, male    DOB: 07-28-1971, 50 y.o.   MRN: [id number] Chief Complaint  Patient presents with   Follow-up    3  mo BLE ven reflux    Mike Kline is a 50 year old male that presents today for follow-up evaluation of lower extremity edema.  Today the lower extremity edema is greatly improved.  He has no wounds or ulcerations.  No evidence of cellulitis.  The patient has been compliant with conservative therapy including use of medical grade compression socks, elevation and activity.  Today noninvasive studies show no evidence of DVT or superficial thrombophlebitis bilaterally.  No evidence of deep venous insufficiency in the right.  Left lower extremity has venous insufficiency in the left common femoral vein.  No evidence of superficial venous reflux bilaterally.   Review of Systems  Cardiovascular:  Negative for leg swelling.  All other systems reviewed and are negative.     Objective:   Physical Exam Vitals reviewed.  HENT:     Head: Normocephalic.  Cardiovascular:     Rate and Rhythm: Normal rate.     Pulses: Normal pulses.  Pulmonary:     Effort: Pulmonary effort is normal.  Skin:    General: Skin is warm and dry.  Neurological:     Mental Status: He is alert and oriented to person, place, and time.  Psychiatric:        Mood and Affect: Mood normal.        Behavior: Behavior normal.        Thought Content: Thought content normal.        Judgment: Judgment normal.    BP 124/72   Pulse 83   Ht 5\' 7"  (1.702 m)   Wt 215 lb (97.5 kg)   BMI 33.67 kg/m   Past Medical History:  Diagnosis Date   Adhesive capsulitis    permanent disability 07/2010, left shoulder   Anginal pain (HCC)    Blindness of left eye    Diabetes mellitus without complication (HCC)    Diabetic, retinopathy, proliferative (HCC)    GERD (gastroesophageal reflux disease)    Hyperlipidemia    Hypertension     Social History   Socioeconomic History    Marital status: Divorced    Spouse name: Not on file   Number of children: Not on file   Years of education: Not on file   Highest education level: Not on file  Occupational History   Occupation: disability  Tobacco Use   Smoking status: Never   Smokeless tobacco: Never   Tobacco comments:    Never  Vaping Use   Vaping Use: Never used  Substance and Sexual Activity   Alcohol use: Not Currently    Comment: socially   Drug use: No   Sexual activity: Not on file  Other Topics Concern   Not on file  Social History Narrative   Not on file   Social Determinants of Health   Financial Resource Strain: Low Risk    Difficulty of Paying Living Expenses: Not hard at all  Food Insecurity: No Food Insecurity   Worried About Charity fundraiser in the Last Year: Never true   Ran Out of Food in the Last Year: Never true  Transportation Needs: No Transportation Needs   Lack of Transportation (Medical): No   Lack of Transportation (Non-Medical): No  Physical Activity: Inactive   Days of Exercise per Week: 0 days   Minutes  of Exercise per Session: 0 min  Stress: No Stress Concern Present   Feeling of Stress : Not at all  Social Connections: Not on file  Intimate Partner Violence: Not on file    Past Surgical History:  Procedure Laterality Date   APPENDECTOMY     CABG x4  06/02/2019   [place]   CARPAL TUNNEL RELEASE Bilateral    cataract surgery Bilateral 2014   EYE SURGERY Left    LAA clip Left 06/02/2019   [name]   LEFT HEART CATH AND CORONARY ANGIOGRAPHY Left 05/22/2019   Procedure: LEFT HEART CATH AND CORONARY ANGIOGRAPHY;  Surgeon: Yolonda Kida, MD;  Location: [facility];  Service: Cardiovascular;  Laterality: Left;   SHOULDER SURGERY Left 2009    Family History  Problem Relation Age of Onset   Bone cancer Mother     No Known Allergies  CBC Latest Ref Rng & Units 02/05/2021 01/11/2021 10/23/2020  WBC 3.4 - 10.8 x10E3/uL 7.1 4.3 5.2  Hemoglobin  13.0 - 17.7 g/dL 11.5(L) 12.1(L) 12.2(L)  Hematocrit 37.5 - 51.0 % 36.7(L) 37.6(L) 38.4  Platelets 150 - 450 x10E3/uL 231 198 264      CMP     Component Value Date/Time   NA 134 (L) 01/11/2021 1935   NA 135 10/23/2020 0855   K 4.5 01/11/2021 1935   CL 103 01/11/2021 1935   CO2 23 01/11/2021 1935   GLUCOSE 232 (H) 01/11/2021 1935   BUN 17 01/11/2021 1935   BUN 19 10/23/2020 0855   CREATININE 1.14 01/11/2021 1935   CALCIUM 8.7 (L) 01/11/2021 1935   PROT 7.0 04/08/2021 1117   ALBUMIN 4.4 04/08/2021 1117   AST 35 04/08/2021 1117   AST 31 12/15/2016 1418   ALT 34 04/08/2021 1117   ALT 35 12/15/2016 1418   ALKPHOS 148 (H) 04/08/2021 1117   BILITOT 0.2 04/08/2021 1117   GFRNONAA >60 01/11/2021 1935   GFRAA 80 04/14/2019 1141     No results found.     Assessment & Plan:   1. Leg swelling Today the patient has very good control of his lower extremity edema.  He is advised to continue with conservative therapy of medical grade compression stockings worn daily.  He should also continue to elevate his lower extremities as well as being active as possible.  The deep venous insufficiency that the patient has not amenable to intervention and conservative therapy is the current available treatment.  Patient is advised to follow-up with [place] on an as-needed basis if he is unable to control edema with conservative tactics.  2. Primary hypertension Continue antihypertensive medications as already ordered, these medications have been reviewed and there are no changes at this time.    Current Outpatient Medications on File Prior to Visit  Medication Sig Dispense Refill   albuterol (VENTOLIN HFA) 108 (90 Base) MCG/ACT inhaler Inhale 2 puffs into the lungs every 6 (six) hours as needed for wheezing or shortness of breath (Cough). 8 g 2   ALLERGY RELIEF 180 MG tablet Take 1 tablet (180 mg total) by mouth daily. 30 tablet 0   amitriptyline (ELAVIL) 100 MG tablet TAKE ONE TABLET BY MOUTH AT  BEDTIME 30 tablet 3   Continuous Blood Gluc Sensor (FREESTYLE LIBRE 2 SENSOR) MISC PLACE 1 TO THE BACK OF YOUR UPPER ARM, REPLACE EVERY 14 DAYS 2 each 0   famotidine (PEPCID) 20 MG tablet TAKE ONE TABLET BY MOUTH AT BEDTIME 30 tablet 0   fluticasone  furoate-vilanterol (BREO ELLIPTA) 200-25 MCG/INH AEPB Inhale 1 puff into the lungs daily. 28 each 2   furosemide (LASIX) 40 MG tablet TAKE ONE TABLET BY MOUTH ONE TIME DAILY 12 tablet 0   gabapentin (NEURONTIN) 300 MG capsule TAKE ONE CAPSULE BY MOUTH THREE TIMES A DAY 270 capsule 0   HUMALOG KWIKPEN 100 UNIT/ML KiwkPen USE THREE TIMES A DAY AS DIRECTED.(UP TO 50 UNITS A DAY OR AS DIRECTED BY SLIDING SCALE) (Patient taking differently: Inject 6-10 Units into the skin 3 (three) times daily.) 15 mL 6   insulin glargine (LANTUS SOLOSTAR) 100 UNIT/ML Solostar Pen Inject 40 Units into the skin daily at 10 pm.     losartan (COZAAR) 25 MG tablet Take 1 tablet (25 mg total) by mouth daily. 90 tablet 1   metoprolol tartrate (LOPRESSOR) 25 MG tablet Take 1 tablet (25 mg total) by mouth 2 (two) times daily. 180 tablet 1   montelukast (SINGULAIR) 10 MG tablet Take 1 tablet (10 mg total) by mouth at bedtime. 30 tablet 3   ondansetron (ZOFRAN) 8 MG tablet TAKE ONE TABLET BY MOUTH EVERY 8 HOURS AS NEEDED FOR NAUSEA AND VOMITING 20 tablet 0   pantoprazole (PROTONIX) 40 MG tablet Take 1 tablet (40 mg total) by mouth daily. 90 tablet 1   pramipexole (MIRAPEX) 0.25 MG tablet Take 1 tablet (0.25 mg total) by mouth every evening. 30 tablet 5   rosuvastatin (CRESTOR) 40 MG tablet TAKE ONE TABLET BY MOUTH ONE TIME DAILY 90 tablet 1   sildenafil (VIAGRA) 50 MG tablet Take 50 mg by mouth daily as needed.     terbinafine (LAMISIL) 250 MG tablet Take 1 tablet (250 mg total) by mouth daily. 90 tablet 0   No current facility-administered medications on file prior to visit.    There are no Patient Instructions on file for this visit. No follow-ups on file.   Georgiana Spinner,  NP

## 2021-07-06 ENCOUNTER — Other Ambulatory Visit: Payer: Self-pay | Admitting: Internal Medicine

## 2021-07-06 ENCOUNTER — Other Ambulatory Visit: Payer: Self-pay | Admitting: Podiatry

## 2021-07-07 MED ORDER — GABAPENTIN 300 MG PO CAPS: 300.0000 mg | ORAL_CAPSULE | Freq: Three times a day (TID) | ORAL | 0 refills | Status: DC

## 2021-07-07 MED ORDER — TERBINAFINE HCL 250 MG PO TABS: 250.0000 mg | ORAL_TABLET | Freq: Every day | ORAL | 0 refills | Status: DC

## 2021-07-07 NOTE — Telephone Encounter (Signed)
Please advise 

## 2021-07-12 DIAGNOSIS — E1042 Type 1 diabetes mellitus with diabetic polyneuropathy: Secondary | ICD-10-CM | POA: Insufficient documentation

## 2021-07-17 ENCOUNTER — Telehealth (INDEPENDENT_AMBULATORY_CARE_PROVIDER_SITE_OTHER): Payer: Medicare Other | Admitting: Internal Medicine

## 2021-07-17 ENCOUNTER — Other Ambulatory Visit: Payer: Self-pay

## 2021-07-17 ENCOUNTER — Other Ambulatory Visit: Payer: Medicare Other

## 2021-07-17 ENCOUNTER — Encounter: Payer: Self-pay | Admitting: Internal Medicine

## 2021-07-17 DIAGNOSIS — R600 Localized edema: Secondary | ICD-10-CM | POA: Diagnosis not present

## 2021-07-17 DIAGNOSIS — E7800 Pure hypercholesterolemia, unspecified: Secondary | ICD-10-CM

## 2021-07-17 DIAGNOSIS — Z1329 Encounter for screening for other suspected endocrine disorder: Secondary | ICD-10-CM

## 2021-07-17 DIAGNOSIS — Z125 Encounter for screening for malignant neoplasm of prostate: Secondary | ICD-10-CM

## 2021-07-17 DIAGNOSIS — IMO0002 Reserved for concepts with insufficient information to code with codable children: Secondary | ICD-10-CM

## 2021-07-17 LAB — MICROALBUMIN, URINE WAIVED
Creatinine, Urine Waived: 100 mg/dL (ref 10–300)
Microalb, Ur Waived: 30 mg/L — ABNORMAL HIGH (ref 0–19)
Microalb/Creat Ratio: <30 mg/g (ref <30)

## 2021-07-17 NOTE — Progress Notes (Addendum)
There were no vitals taken for this visit.   Subjective:    Patient ID: Mike Kline, male    DOB: 1971-06-17, 50 y.o.   MRN: [id number]  Chief Complaint  Patient presents with   Edema    B/L leg swelling up to the thighs. For the past few days.     HPI: Mike Kline is a 50 y.o. male  Edema + bil pitting started x 2 days , is bilateral into the thighs per pt.    SOBOE worsening   Shortness of Breath This is a chronic problem. The current episode started yesterday. The problem occurs intermittently. Associated symptoms include leg swelling. Pertinent negatives include no abdominal pain, chest pain, claudication, hemoptysis, leg pain, neck pain, orthopnea, rhinorrhea, sore throat, sputum production, swollen glands, syncope, vomiting or wheezing.   Chief Complaint  Patient presents with   Edema    B/L leg swelling up to the thighs. For the past few days.     Relevant past medical, surgical, family and social history reviewed and updated as indicated. Interim medical history since our last visit reviewed. Allergies and medications reviewed and updated.  Review of Systems  HENT:  Negative for rhinorrhea and sore throat.   Respiratory:  Positive for shortness of breath. Negative for hemoptysis, sputum production and wheezing.   Cardiovascular:  Positive for leg swelling. Negative for chest pain, orthopnea, claudication and syncope.  Gastrointestinal:  Negative for abdominal pain and vomiting.  Musculoskeletal:  Negative for neck pain.   Per HPI unless specifically indicated above     Objective:    There were no vitals taken for this visit.  Wt Readings from Last 3 Encounters:  06/26/21 215 lb (97.5 kg)  04/24/21 222 lb 12.8 oz (101.1 kg)  03/26/21 222 lb 6.4 oz (100.9 kg)    Physical Exam Constitutional:      General: He is not in acute distress.    Appearance: He is not ill-appearing, toxic-appearing or diaphoretic.  HENT:     Head: Normocephalic and atraumatic.      Nose: No congestion or rhinorrhea.  Musculoskeletal:        General: Swelling present.  Neurological:     Mental Status: He is alert.    Results for orders placed or performed in visit on 07/17/21  Microalbumin, Urine Waived  Result Value Ref Range   Microalb, Ur Waived 30 (H) 0 - 19 mg/L   Creatinine, Urine Waived 100 10 - 300 mg/dL   Microalb/Creat Ratio <30 <30 mg/g  PSA  Result Value Ref Range   Prostate Specific Ag, Serum 0.2 0.0 - 4.0 ng/mL  TSH  Result Value Ref Range   TSH 1.600 0.450 - 4.500 uIU/mL  Hemoglobin A1c  Result Value Ref Range   Hgb A1c MFr Bld 11.3 (H) 4.8 - 5.6 %   Est. average glucose Bld gHb Est-mCnc 278 mg/dL  Lipid panel  Result Value Ref Range   Cholesterol, Total 120 100 - 199 mg/dL   Triglycerides 162 (H) 0 - 149 mg/dL   HDL 39 (L) >39 mg/dL   VLDL Cholesterol Cal 27 5 - 40 mg/dL   LDL Chol Calc (NIH) 54 0 - 99 mg/dL   Chol/HDL Ratio 3.1 0.0 - 5.0 ratio  Comprehensive metabolic panel  Result Value Ref Range   Glucose 104 (H) 70 - 99 mg/dL   BUN 14 6 - 24 mg/dL   Creatinine, Ser 1.04 0.76 - 1.27 mg/dL   eGFR 86 >  59 mL/min/1.73   BUN/Creatinine Ratio 13 9 - 20   Sodium 140 134 - 144 mmol/L   Potassium 4.5 3.5 - 5.2 mmol/L   Chloride 103 96 - 106 mmol/L   CO2 23 20 - 29 mmol/L   Calcium 9.2 8.7 - 10.2 mg/dL   Total Protein 6.6 6.0 - 8.5 g/dL   Albumin 4.2 3.8 - 4.9 g/dL   Globulin, Total 2.4 1.5 - 4.5 g/dL   Albumin/Globulin Ratio 1.8 1.2 - 2.2   Bilirubin Total <0.2 0.0 - 1.2 mg/dL   Alkaline Phosphatase 139 (H) 44 - 121 IU/L   AST 24 0 - 40 IU/L   ALT 25 0 - 44 IU/L  CBC w/Diff  Result Value Ref Range   WBC 5.4 3.4 - 10.8 x10E3/uL   RBC 5.04 4.14 - 5.80 x10E6/uL   Hemoglobin 12.0 (L) 13.0 - 17.7 g/dL   Hematocrit 39.0 37.5 - 51.0 %   MCV 77 (L) 79 - 97 fL   MCH 23.8 (L) 26.6 - 33.0 pg   MCHC 30.8 (L) 31.5 - 35.7 g/dL   RDW 14.7 11.6 - 15.4 %   Platelets 260 150 - 450 x10E3/uL   Neutrophils 63 Not Estab. %   Lymphs 24 Not  Estab. %   Monocytes 9 Not Estab. %   Eos 3 Not Estab. %   Basos 1 Not Estab. %   Neutrophils Absolute 3.4 1.4 - 7.0 x10E3/uL   Lymphocytes Absolute 1.3 0.7 - 3.1 x10E3/uL   Monocytes Absolute 0.5 0.1 - 0.9 x10E3/uL   EOS (ABSOLUTE) 0.2 0.0 - 0.4 x10E3/uL   Basophils Absolute 0.0 0.0 - 0.2 x10E3/uL   Immature Granulocytes 0 Not Estab. %   Immature Grans (Abs) 0.0 0.0 - 0.1 x10E3/uL        Current Outpatient Medications:    albuterol (VENTOLIN HFA) 108 (90 Base) MCG/ACT inhaler, Inhale 2 puffs into the lungs every 6 (six) hours as needed for wheezing or shortness of breath (Cough)., Disp: 8 g, Rfl: 2   ALLERGY RELIEF 180 MG tablet, Take 1 tablet (180 mg total) by mouth daily., Disp: 30 tablet, Rfl: 0   amitriptyline (ELAVIL) 100 MG tablet, TAKE ONE TABLET BY MOUTH AT BEDTIME, Disp: 30 tablet, Rfl: 3   Continuous Blood Gluc Sensor (FREESTYLE LIBRE 2 SENSOR) MISC, PLACE 1 TO THE BACK OF YOUR UPPER ARM, REPLACE EVERY 14 DAYS, Disp: 2 each, Rfl: 0   famotidine (PEPCID) 20 MG tablet, TAKE ONE TABLET BY MOUTH AT BEDTIME, Disp: 30 tablet, Rfl: 0   fluticasone furoate-vilanterol (BREO ELLIPTA) 200-25 MCG/INH AEPB, Inhale 1 puff into the lungs daily., Disp: 28 each, Rfl: 2   furosemide (LASIX) 40 MG tablet, TAKE ONE TABLET BY MOUTH ONE TIME DAILY, Disp: 12 tablet, Rfl: 0   gabapentin (NEURONTIN) 300 MG capsule, Take 1 capsule (300 mg total) by mouth 3 (three) times daily., Disp: 270 capsule, Rfl: 0   HUMALOG KWIKPEN 100 UNIT/ML KiwkPen, USE THREE TIMES A DAY AS DIRECTED.(UP TO 50 UNITS A DAY OR AS DIRECTED BY SLIDING SCALE) (Patient taking differently: Inject 6-10 Units into the skin 3 (three) times daily.), Disp: 15 mL, Rfl: 6   insulin glargine (LANTUS SOLOSTAR) 100 UNIT/ML Solostar Pen, Inject 40 Units into the skin daily at 10 pm., Disp: , Rfl:    losartan (COZAAR) 25 MG tablet, Take 1 tablet (25 mg total) by mouth daily., Disp: 90 tablet, Rfl: 1   metoprolol tartrate (LOPRESSOR) 25 MG tablet,  Take 1 tablet (  25 mg total) by mouth 2 (two) times daily., Disp: 180 tablet, Rfl: 1   montelukast (SINGULAIR) 10 MG tablet, Take 1 tablet (10 mg total) by mouth at bedtime., Disp: 30 tablet, Rfl: 3   ondansetron (ZOFRAN) 8 MG tablet, TAKE ONE TABLET BY MOUTH EVERY 8 HOURS AS NEEDED FOR NAUSEA AND VOMITING, Disp: 20 tablet, Rfl: 0   pantoprazole (PROTONIX) 40 MG tablet, Take 1 tablet (40 mg total) by mouth daily., Disp: 90 tablet, Rfl: 1   pramipexole (MIRAPEX) 0.25 MG tablet, Take 1 tablet (0.25 mg total) by mouth every evening., Disp: 30 tablet, Rfl: 5   rosuvastatin (CRESTOR) 40 MG tablet, TAKE ONE TABLET BY MOUTH ONE TIME DAILY, Disp: 90 tablet, Rfl: 1   sildenafil (VIAGRA) 50 MG tablet, Take 50 mg by mouth daily as needed., Disp: , Rfl:    terbinafine (LAMISIL) 250 MG tablet, Take 1 tablet (250 mg total) by mouth daily., Disp: 90 tablet, Rfl: 0    Assessment & Plan:  Lower ext swelling / ho PVD and CAD: Pt to go to the ER sec to SOBOE.  Pt verbalized understanding of the above.     Problem List Items Addressed This Visit   None   No orders of the defined types were placed in this encounter.    No orders of the defined types were placed in this encounter.    Follow up plan: No follow-ups on file.   This visit was completed via video visit through MyChart due to the restrictions of the COVID-19 pandemic. All issues as above were discussed and addressed. Physical exam was done as above through visual confirmation on video through MyChart. If it was felt that the patient should be evaluated in the office, they were directed there. The patient verbally consented to this visit. Location of the patient: home Location of the provider: work Those involved with this call:  Provider: Charlynne Cousins, MD CMA: Frazier Butt, [place] Desk/Registration: [employer]  Time spent on call:  10 minutes with patient face to face via video conference. More than 50% of this time was spent in  counseling and coordination of care. 10 minutes total spent in review of patient's record and preparation of their chart.

## 2021-07-18 LAB — CBC WITH DIFFERENTIAL/PLATELET
Basophils Absolute: 0 10*3/uL (ref 0.0–0.2)
Basos: 1 %
EOS (ABSOLUTE): 0.2 10*3/uL (ref 0.0–0.4)
Eos: 3 %
Hematocrit: 39 % (ref 37.5–51.0)
Hemoglobin: 12 g/dL — ABNORMAL LOW (ref 13.0–17.7)
Immature Grans (Abs): 0 10*3/uL (ref 0.0–0.1)
Immature Granulocytes: 0 %
Lymphocytes Absolute: 1.3 10*3/uL (ref 0.7–3.1)
Lymphs: 24 %
MCH: 23.8 pg — ABNORMAL LOW (ref 26.6–33.0)
MCHC: 30.8 g/dL — ABNORMAL LOW (ref 31.5–35.7)
MCV: 77 fL — ABNORMAL LOW (ref 79–97)
Monocytes Absolute: 0.5 10*3/uL (ref 0.1–0.9)
Monocytes: 9 %
Neutrophils Absolute: 3.4 10*3/uL (ref 1.4–7.0)
Neutrophils: 63 %
Platelets: 260 10*3/uL (ref 150–450)
RBC: 5.04 x10E6/uL (ref 4.14–5.80)
RDW: 14.7 % (ref 11.6–15.4)
WBC: 5.4 10*3/uL (ref 3.4–10.8)

## 2021-07-18 LAB — COMPREHENSIVE METABOLIC PANEL
ALT: 25 IU/L (ref 0–44)
AST: 24 IU/L (ref 0–40)
Albumin/Globulin Ratio: 1.8 (ref 1.2–2.2)
Albumin: 4.2 g/dL (ref 3.8–4.9)
Alkaline Phosphatase: 139 IU/L — ABNORMAL HIGH (ref 44–121)
BUN/Creatinine Ratio: 13 (ref 9–20)
BUN: 14 mg/dL (ref 6–24)
Bilirubin Total: <0.2 mg/dL (ref 0.0–1.2)
CO2: 23 mmol/L (ref 20–29)
Calcium: 9.2 mg/dL (ref 8.7–10.2)
Chloride: 103 mmol/L (ref 96–106)
Creatinine, Ser: 1.04 mg/dL (ref 0.76–1.27)
Globulin, Total: 2.4 g/dL (ref 1.5–4.5)
Glucose: 104 mg/dL — ABNORMAL HIGH (ref 70–99)
Potassium: 4.5 mmol/L (ref 3.5–5.2)
Sodium: 140 mmol/L (ref 134–144)
Total Protein: 6.6 g/dL (ref 6.0–8.5)
eGFR: 86 mL/min/{1.73_m2} (ref >59)

## 2021-07-18 LAB — HEMOGLOBIN A1C
Est. average glucose Bld gHb Est-mCnc: 278 mg/dL
Hgb A1c MFr Bld: 11.3 % — ABNORMAL HIGH (ref 4.8–5.6)

## 2021-07-18 LAB — LIPID PANEL
Chol/HDL Ratio: 3.1 ratio (ref 0.0–5.0)
Cholesterol, Total: 120 mg/dL (ref 100–199)
HDL: 39 mg/dL — ABNORMAL LOW (ref >39)
LDL Chol Calc (NIH): 54 mg/dL (ref 0–99)
Triglycerides: 162 mg/dL — ABNORMAL HIGH (ref 0–149)
VLDL Cholesterol Cal: 27 mg/dL (ref 5–40)

## 2021-07-18 LAB — TSH: TSH: 1.6 u[IU]/mL (ref 0.450–4.500)

## 2021-07-18 LAB — PSA: Prostate Specific Ag, Serum: 0.2 ng/mL (ref 0.0–4.0)

## 2021-07-21 ENCOUNTER — Encounter: Payer: Self-pay | Admitting: Internal Medicine

## 2021-07-21 DIAGNOSIS — R600 Localized edema: Secondary | ICD-10-CM | POA: Insufficient documentation

## 2021-07-22 IMAGING — CT CT CHEST-ABD-PELV W/O CM
2 of 4 series · 13 of 36 positions shown, 18 images · non-contrast
Comparison: None.

CLINICAL DATA: Cough and left upper quadrant abdominal pain.

EXAM:
CT CHEST, ABDOMEN AND PELVIS WITHOUT CONTRAST
TECHNIQUE: Multidetector CT imaging of the chest, abdomen and pelvis was
performed following the standard protocol without IV contrast.

[Series 2: cap wo st · axial · 0.95mm/px · z∈[-1007,-502]mm · 10 of 123 slices shown, 15 images]
[im 11/123  mediastinal]
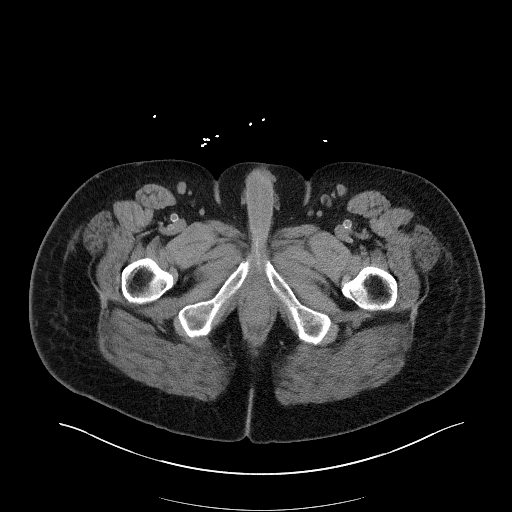
[im 11/123  bone]
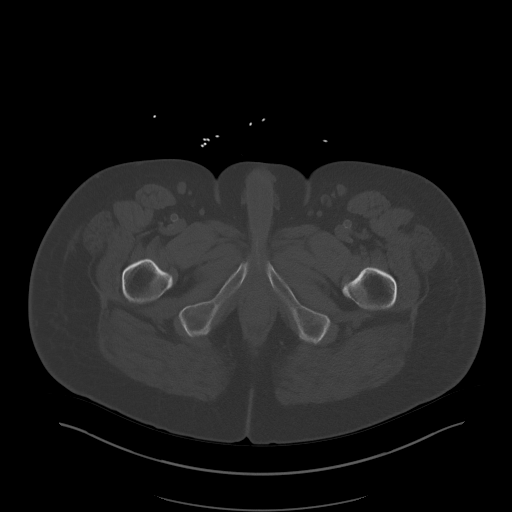
[im 21/123  mediastinal]
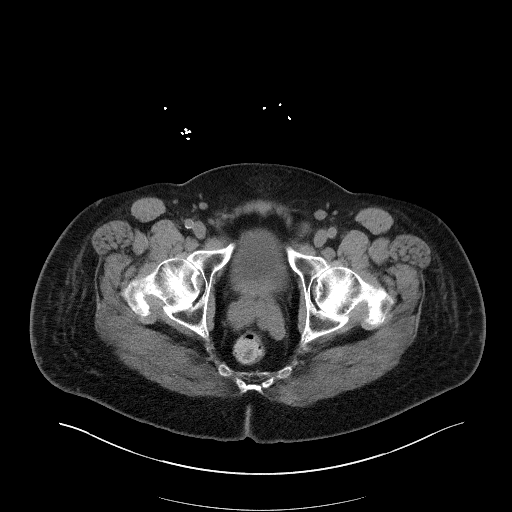
[im 41/123  mediastinal]
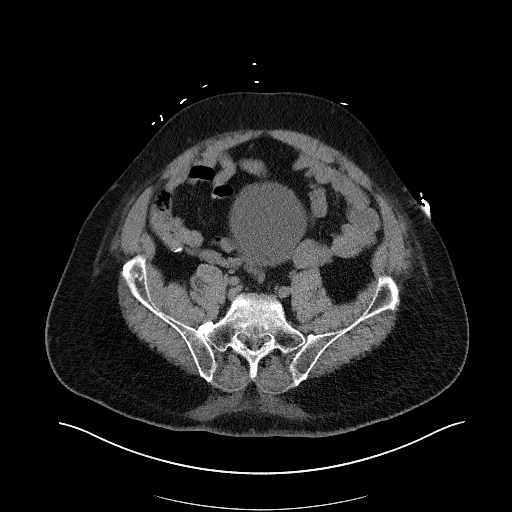
[im 51/123  mediastinal]
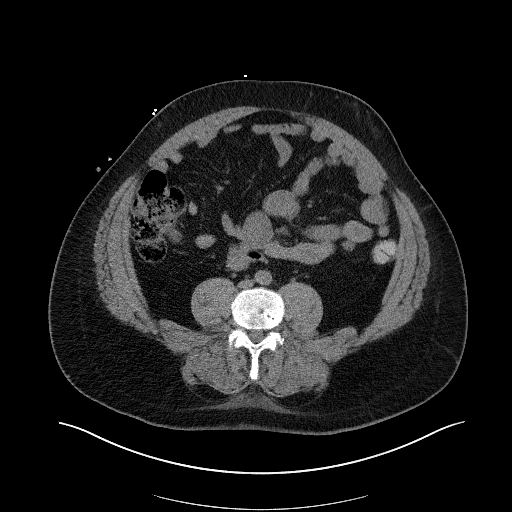
[im 62/123  mediastinal]
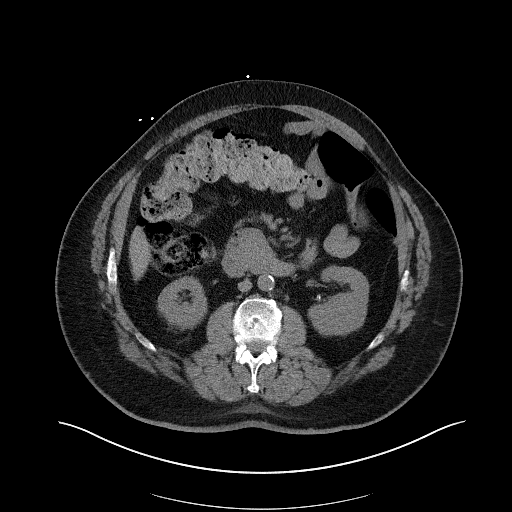
[im 72/123  mediastinal]
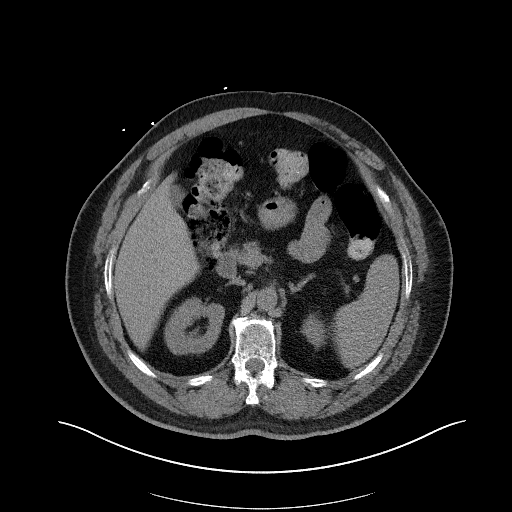
[im 82/123  mediastinal]
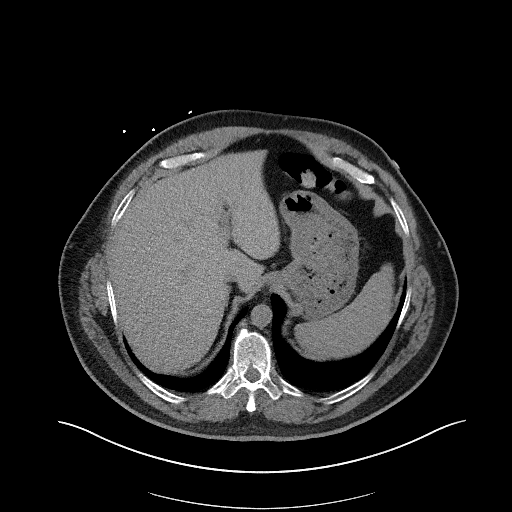
[im 82/123  lung]
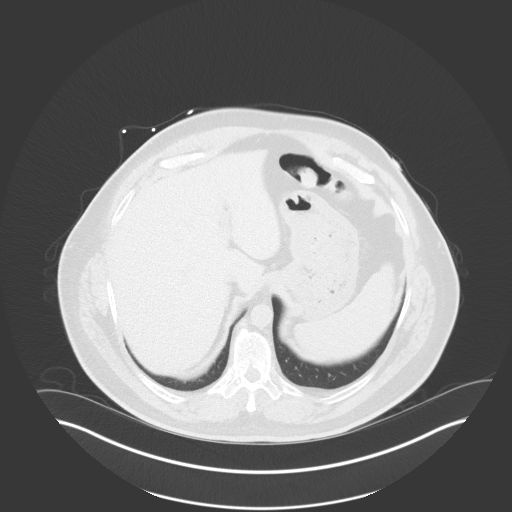
[im 92/123  lung]
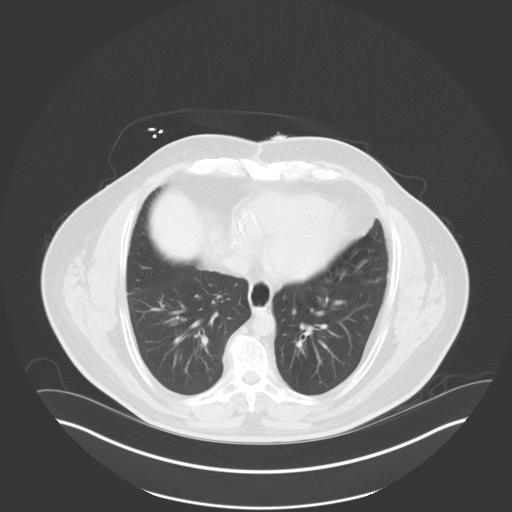
[im 102/123  mediastinal]
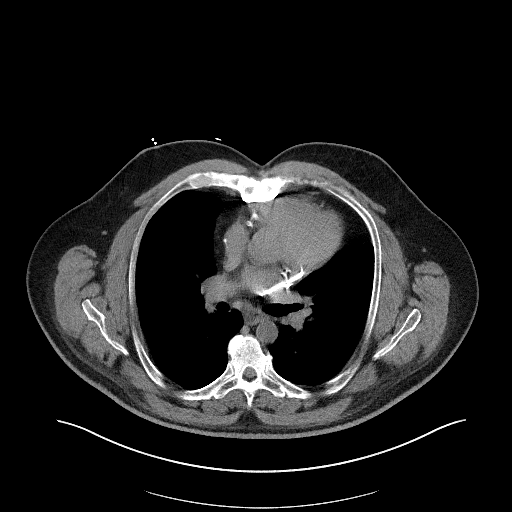
[im 102/123  lung]
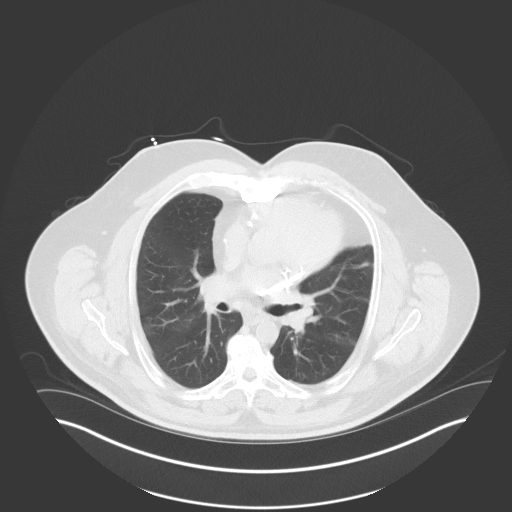
[im 112/123  mediastinal]
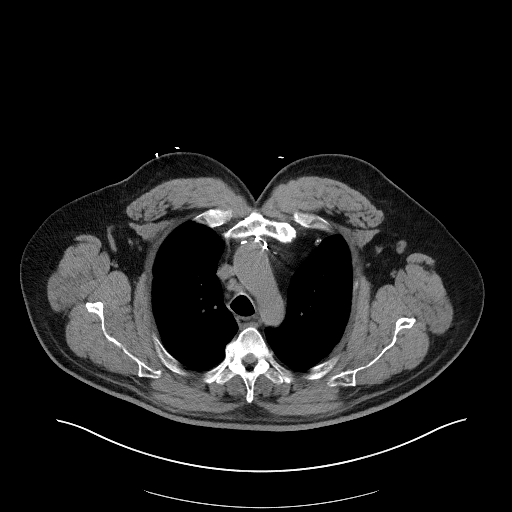
[im 112/123  lung]
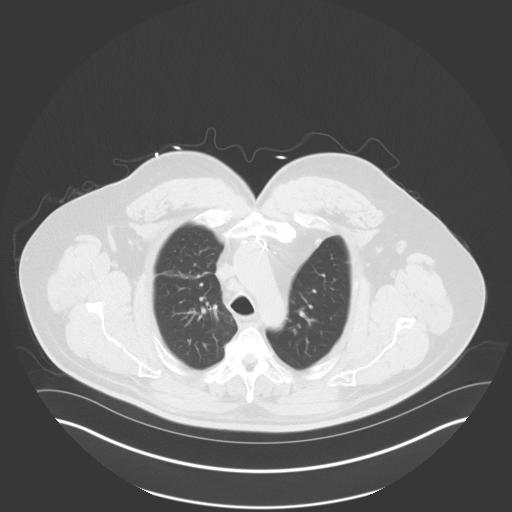
[im 112/123  bone]
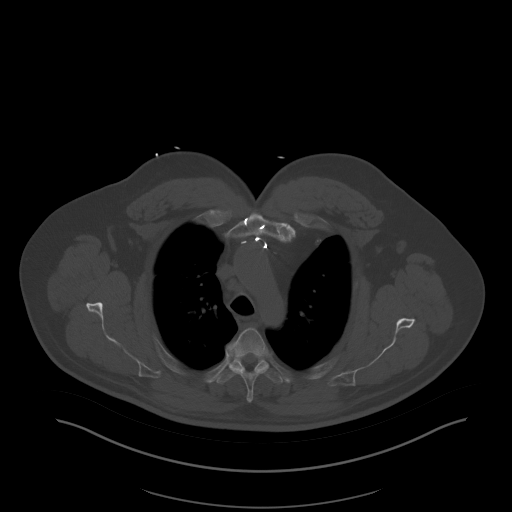

[Series 5: coronal · coronal · 0.88mm/px · 3 of 177 slices shown]
[im 36/177  mediastinal]
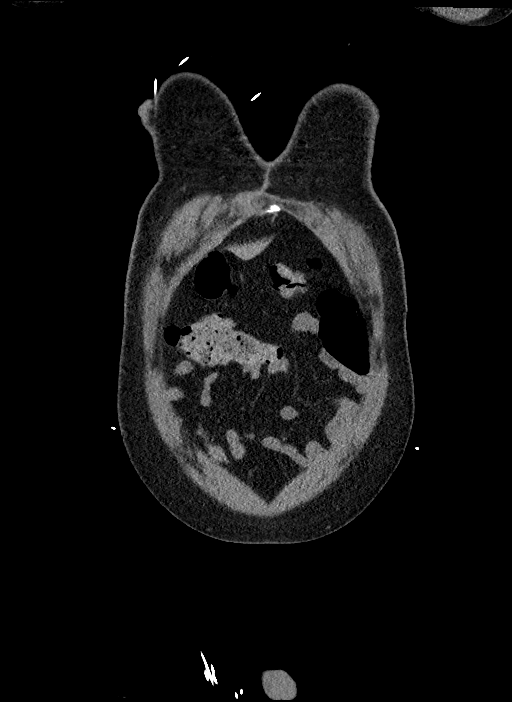
[im 71/177  mediastinal]
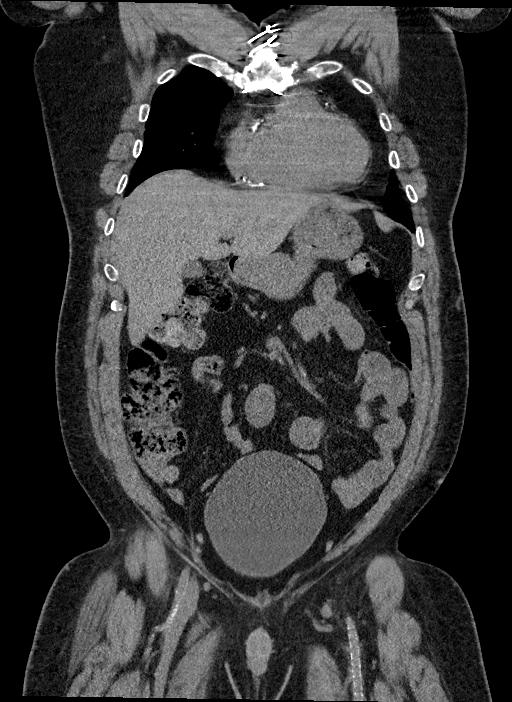
[im 106/177  mediastinal]
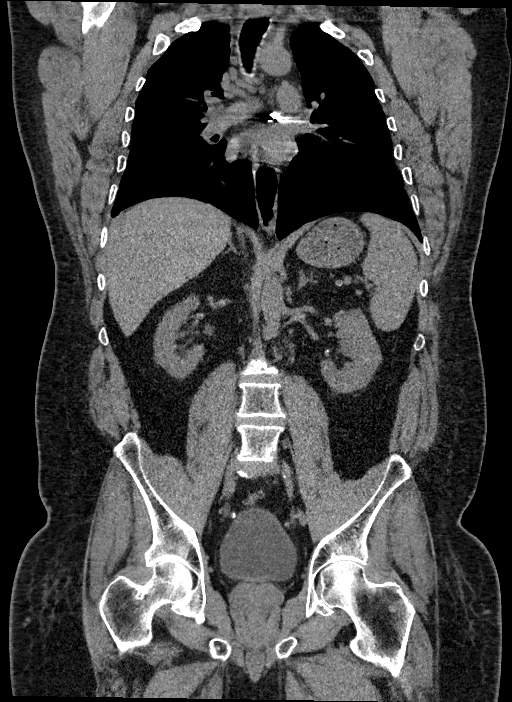

[13 of 36 positions shown; findings below may reference images not displayed]

FINDINGS: CT CHEST FINDINGS

Cardiovascular: No significant vascular findings. Normal heart size.
No pericardial effusion.

Mediastinum/Nodes: Patulous esophagus. No mediastinal or axillary
lymphadenopathy.

Lungs/Pleura: Lungs are clear. No pleural effusion or pneumothorax.

Musculoskeletal: No chest wall mass or suspicious bone lesions
identified.

CT ABDOMEN PELVIS FINDINGS

Hepatobiliary: No focal liver abnormality is seen. No gallstones,
gallbladder wall thickening, or biliary dilatation.

Pancreas: Unremarkable. No pancreatic ductal dilatation or
surrounding inflammatory changes.

Spleen: Normal in size without focal abnormality.

Adrenals/Urinary Tract: Adrenal glands are unremarkable. Kidneys are
normal, without renal calculi, focal lesion, or hydronephrosis.
Bladder is unremarkable.

Stomach/Bowel: Stomach is within normal limits. No evidence of bowel
wall thickening, distention, or inflammatory changes.

Vascular/Lymphatic: Aortic atherosclerosis. No enlarged abdominal or
pelvic lymph nodes.

Reproductive: Prostate is unremarkable.

Other: No abdominal wall hernia or abnormality. No abdominopelvic
ascites.

Musculoskeletal: No acute or significant osseous findings.
IMPRESSION: 1. No acute abnormality of the chest, abdomen or pelvis.
2. Patulous esophagus.

Aortic Atherosclerosis ([id number]).

## 2021-07-23 ENCOUNTER — Encounter: Payer: Self-pay | Admitting: Internal Medicine

## 2021-07-24 ENCOUNTER — Encounter: Payer: Self-pay | Admitting: Internal Medicine

## 2021-07-24 ENCOUNTER — Ambulatory Visit (INDEPENDENT_AMBULATORY_CARE_PROVIDER_SITE_OTHER): Payer: Medicare Other | Admitting: Internal Medicine

## 2021-07-24 ENCOUNTER — Other Ambulatory Visit: Payer: Self-pay

## 2021-07-24 VITALS — BP 140/73 | HR 89 | Temp 98.2°F | Ht 66.93 in | Wt 221.8 lb

## 2021-07-24 DIAGNOSIS — R5381 Other malaise: Secondary | ICD-10-CM | POA: Diagnosis not present

## 2021-07-24 DIAGNOSIS — R600 Localized edema: Secondary | ICD-10-CM | POA: Diagnosis not present

## 2021-07-24 DIAGNOSIS — E1051 Type 1 diabetes mellitus with diabetic peripheral angiopathy without gangrene: Secondary | ICD-10-CM | POA: Diagnosis not present

## 2021-07-24 DIAGNOSIS — I10 Essential (primary) hypertension: Secondary | ICD-10-CM

## 2021-07-24 NOTE — Progress Notes (Signed)
BP 140/73    Pulse 89    Temp 98.2 F (36.8 C) (Oral)    Ht 5' 6.93" (1.7 m)    Wt 221 lb 12.8 oz (100.6 kg)    SpO2 97%    BMI 34.81 kg/m    Subjective:    Patient ID: Mike Kline, male    DOB: 11/01/1970, 50 y.o.   MRN: [id number]  Chief Complaint  Patient presents with   Edema   Diabetes    HPI: Mike Kline is a 50 y.o. male  Edema - is on torsemide 40 mg bid was seen by Dr. Juliann Pares - to have a ECHO tomorrow to see him back for a follow up.   Diabetes He presents for his follow-up (a1c -- 11.3 sees dr Tedd Sias for such) diabetic visit. He has type 1 diabetes mellitus. His disease course has been worsening.  Hypertension This is a chronic problem. The problem is controlled.  Cough This is a chronic (is on breo for such cough much better) problem.   Chief Complaint  Patient presents with   Edema   Diabetes    Relevant past medical, surgical, family and social history reviewed and updated as indicated. Interim medical history since our last visit reviewed. Allergies and medications reviewed and updated.  Review of Systems  Respiratory:  Positive for cough.    Per HPI unless specifically indicated above     Objective:    BP 140/73    Pulse 89    Temp 98.2 F (36.8 C) (Oral)    Ht 5' 6.93" (1.7 m)    Wt 221 lb 12.8 oz (100.6 kg)    SpO2 97%    BMI 34.81 kg/m   Wt Readings from Last 3 Encounters:  07/24/21 221 lb 12.8 oz (100.6 kg)  06/26/21 215 lb (97.5 kg)  04/24/21 222 lb 12.8 oz (101.1 kg)    Physical Exam Vitals and nursing note reviewed.  Constitutional:      General: He is not in acute distress.    Appearance: Normal appearance. He is not ill-appearing or diaphoretic.  HENT:     Head: Normocephalic and atraumatic.     Right Ear: Tympanic membrane and external ear normal. There is no impacted cerumen.     Left Ear: External ear normal.     Nose: No congestion or rhinorrhea.     Mouth/Throat:     Pharynx: No oropharyngeal exudate or posterior  oropharyngeal erythema.  Eyes:     Conjunctiva/sclera: Conjunctivae normal.     Pupils: Pupils are equal, round, and reactive to light.  Cardiovascular:     Rate and Rhythm: Normal rate and regular rhythm.     Heart sounds: No murmur heard.   No friction rub. No gallop.  Pulmonary:     Effort: No respiratory distress.     Breath sounds: No stridor. No wheezing or rhonchi.  Chest:     Chest wall: No tenderness.  Abdominal:     General: Abdomen is flat. Bowel sounds are normal.     Palpations: Abdomen is soft. There is no mass.     Tenderness: There is no abdominal tenderness.  Musculoskeletal:     Cervical back: Normal range of motion and neck supple. No rigidity or tenderness.     Left lower leg: No edema.  Skin:    General: Skin is warm and dry.  Neurological:     Mental Status: He is alert.    Results for orders  placed or performed in visit on 07/17/21  Microalbumin, Urine Waived  Result Value Ref Range   Microalb, Ur Waived 30 (H) 0 - 19 mg/L   Creatinine, Urine Waived 100 10 - 300 mg/dL   Microalb/Creat Ratio <30 <30 mg/g  PSA  Result Value Ref Range   Prostate Specific Ag, Serum 0.2 0.0 - 4.0 ng/mL  TSH  Result Value Ref Range   TSH 1.600 0.450 - 4.500 uIU/mL  Hemoglobin A1c  Result Value Ref Range   Hgb A1c MFr Bld 11.3 (H) 4.8 - 5.6 %   Est. average glucose Bld gHb Est-mCnc 278 mg/dL  Lipid panel  Result Value Ref Range   Cholesterol, Total 120 100 - 199 mg/dL   Triglycerides 162 (H) 0 - 149 mg/dL   HDL 39 (L) >39 mg/dL   VLDL Cholesterol Cal 27 5 - 40 mg/dL   LDL Chol Calc (NIH) 54 0 - 99 mg/dL   Chol/HDL Ratio 3.1 0.0 - 5.0 ratio  Comprehensive metabolic panel  Result Value Ref Range   Glucose 104 (H) 70 - 99 mg/dL   BUN 14 6 - 24 mg/dL   Creatinine, Ser 1.04 0.76 - 1.27 mg/dL   eGFR 86 >59 mL/min/1.73   BUN/Creatinine Ratio 13 9 - 20   Sodium 140 134 - 144 mmol/L   Potassium 4.5 3.5 - 5.2 mmol/L   Chloride 103 96 - 106 mmol/L   CO2 23 20 - 29  mmol/L   Calcium 9.2 8.7 - 10.2 mg/dL   Total Protein 6.6 6.0 - 8.5 g/dL   Albumin 4.2 3.8 - 4.9 g/dL   Globulin, Total 2.4 1.5 - 4.5 g/dL   Albumin/Globulin Ratio 1.8 1.2 - 2.2   Bilirubin Total <0.2 0.0 - 1.2 mg/dL   Alkaline Phosphatase 139 (H) 44 - 121 IU/L   AST 24 0 - 40 IU/L   ALT 25 0 - 44 IU/L  CBC w/Diff  Result Value Ref Range   WBC 5.4 3.4 - 10.8 x10E3/uL   RBC 5.04 4.14 - 5.80 x10E6/uL   Hemoglobin 12.0 (L) 13.0 - 17.7 g/dL   Hematocrit 39.0 37.5 - 51.0 %   MCV 77 (L) 79 - 97 fL   MCH 23.8 (L) 26.6 - 33.0 pg   MCHC 30.8 (L) 31.5 - 35.7 g/dL   RDW 14.7 11.6 - 15.4 %   Platelets 260 150 - 450 x10E3/uL   Neutrophils 63 Not Estab. %   Lymphs 24 Not Estab. %   Monocytes 9 Not Estab. %   Eos 3 Not Estab. %   Basos 1 Not Estab. %   Neutrophils Absolute 3.4 1.4 - 7.0 x10E3/uL   Lymphocytes Absolute 1.3 0.7 - 3.1 x10E3/uL   Monocytes Absolute 0.5 0.1 - 0.9 x10E3/uL   EOS (ABSOLUTE) 0.2 0.0 - 0.4 x10E3/uL   Basophils Absolute 0.0 0.0 - 0.2 x10E3/uL   Immature Granulocytes 0 Not Estab. %   Immature Grans (Abs) 0.0 0.0 - 0.1 x10E3/uL        Current Outpatient Medications:    albuterol (VENTOLIN HFA) 108 (90 Base) MCG/ACT inhaler, Inhale 2 puffs into the lungs every 6 (six) hours as needed for wheezing or shortness of breath (Cough)., Disp: 8 g, Rfl: 2   ALLERGY RELIEF 180 MG tablet, Take 1 tablet (180 mg total) by mouth daily., Disp: 30 tablet, Rfl: 0   amitriptyline (ELAVIL) 100 MG tablet, TAKE ONE TABLET BY MOUTH AT BEDTIME, Disp: 30 tablet, Rfl: 3   Continuous Blood  Gluc Sensor (FREESTYLE LIBRE 2 SENSOR) MISC, PLACE 1 TO THE BACK OF YOUR UPPER ARM, REPLACE EVERY 14 DAYS, Disp: 2 each, Rfl: 0   famotidine (PEPCID) 20 MG tablet, TAKE ONE TABLET BY MOUTH AT BEDTIME, Disp: 30 tablet, Rfl: 0   fluticasone furoate-vilanterol (BREO ELLIPTA) 200-25 MCG/INH AEPB, Inhale 1 puff into the lungs daily., Disp: 28 each, Rfl: 2   furosemide (LASIX) 40 MG tablet, TAKE ONE TABLET BY  MOUTH ONE TIME DAILY, Disp: 12 tablet, Rfl: 0   gabapentin (NEURONTIN) 300 MG capsule, Take 1 capsule (300 mg total) by mouth 3 (three) times daily., Disp: 270 capsule, Rfl: 0   HUMALOG KWIKPEN 100 UNIT/ML KiwkPen, USE THREE TIMES A DAY AS DIRECTED.(UP TO 50 UNITS A DAY OR AS DIRECTED BY SLIDING SCALE) (Patient taking differently: Inject 6-10 Units into the skin 3 (three) times daily.), Disp: 15 mL, Rfl: 6   insulin glargine (LANTUS SOLOSTAR) 100 UNIT/ML Solostar Pen, Inject 40 Units into the skin daily at 10 pm., Disp: , Rfl:    losartan (COZAAR) 25 MG tablet, Take 1 tablet (25 mg total) by mouth daily., Disp: 90 tablet, Rfl: 1   losartan (COZAAR) 50 MG tablet, Take 50 mg by mouth daily., Disp: , Rfl:    metoprolol tartrate (LOPRESSOR) 25 MG tablet, Take 1 tablet (25 mg total) by mouth 2 (two) times daily., Disp: 180 tablet, Rfl: 1   montelukast (SINGULAIR) 10 MG tablet, Take 1 tablet (10 mg total) by mouth at bedtime., Disp: 30 tablet, Rfl: 3   ondansetron (ZOFRAN) 8 MG tablet, TAKE ONE TABLET BY MOUTH EVERY 8 HOURS AS NEEDED FOR NAUSEA AND VOMITING, Disp: 20 tablet, Rfl: 0   pantoprazole (PROTONIX) 40 MG tablet, Take 1 tablet (40 mg total) by mouth daily., Disp: 90 tablet, Rfl: 1   potassium chloride SA (KLOR-CON M) 20 MEQ tablet, Take 20 mEq by mouth 2 (two) times daily., Disp: , Rfl:    pramipexole (MIRAPEX) 0.25 MG tablet, Take 1 tablet (0.25 mg total) by mouth every evening., Disp: 30 tablet, Rfl: 5   sildenafil (VIAGRA) 50 MG tablet, Take 50 mg by mouth daily as needed., Disp: , Rfl:    terbinafine (LAMISIL) 250 MG tablet, Take 1 tablet (250 mg total) by mouth daily., Disp: 90 tablet, Rfl: 0   torsemide (DEMADEX) 20 MG tablet, Take 40 mg by mouth 2 (two) times daily., Disp: , Rfl:    rosuvastatin (CRESTOR) 40 MG tablet, TAKE ONE TABLET BY MOUTH ONE TIME DAILY, Disp: 90 tablet, Rfl: 0    Assessment & Plan:  Lower ext edema much improved since last visit  Seen vascular surgeon -   was  advised to continue with conservative therapy of medical grade compression stockings worn daily   2. HTN : stable, is on meds per cards. Fu and mx per them has a ho CHF. Continue current meds.  Medication compliance emphasised. pt advised to keep Bp logs. Pt verbalised understanding of the same. Pt to have a low salt diet . Exercise to reach a goal of at least 150 mins a week.  lifestyle modifications explained and pt understands importance of the above.  Type 1 DM sees endocrine fu and mx per them.  check HbA1c,  urine  microalbumin  diabetic diet plan given to pt  adviced regarding hypoglycemia and instructions given to pt today on how to prevent and treat the same if it were to occur. pt acknowledges the plan and voices understanding of the same.  exercise plan given and encouraged.   advice diabetic yearly podiatry, ophthalmology , nutritionist , dental check q 6 months,   Problem List Items Addressed This Visit       Cardiovascular and Mediastinum   Hypertension   Relevant Medications   losartan (COZAAR) 50 MG tablet   torsemide (DEMADEX) 20 MG tablet   Diabetes mellitus type 1 with peripheral artery disease (HCC)   Relevant Medications   losartan (COZAAR) 50 MG tablet   torsemide (DEMADEX) 20 MG tablet     Other   Physical deconditioning   Lower extremity edema - Primary     No orders of the defined types were placed in this encounter.    No orders of the defined types were placed in this encounter.    Follow up plan: Return in about 6 weeks (around 09/04/2021).  Labs next visit : CBC, CMP, FLP, HBA1C, TSH, PSA, urine microalbumin. Labs 1 week prior to next visit.

## 2021-07-30 ENCOUNTER — Telehealth: Payer: Self-pay | Admitting: Internal Medicine

## 2021-07-30 ENCOUNTER — Ambulatory Visit: Payer: Medicare Other

## 2021-07-30 NOTE — Telephone Encounter (Signed)
Copied from CRM [id number]. Topic: Medicare AWV >> Jul 30, 2021  2:51 PM Leigh Aurora wrote: Reason for CRM:  N/A unable to leave a  message for patient to call back and schedule the Medicare Annual Wellness Visit (AWV) virtually or by telephone.  Last AWV 07/29/20  Please schedule at anytime with [facility] Health Advisor.  45 minute appointment  Any questions, please call me at [phone]

## 2021-08-07 ENCOUNTER — Encounter: Payer: Self-pay | Admitting: Internal Medicine

## 2021-08-15 ENCOUNTER — Other Ambulatory Visit: Payer: Self-pay | Admitting: Internal Medicine

## 2021-08-15 NOTE — Telephone Encounter (Signed)
Requested Prescriptions  Pending Prescriptions Disp Refills   rosuvastatin (CRESTOR) 40 MG tablet [Pharmacy Med Name: ROSUVASTATIN 40 MG TAB[*]] 90 tablet 0    Sig: TAKE ONE TABLET BY MOUTH ONE TIME DAILY     Cardiovascular:  Antilipid - Statins Failed - 08/15/2021 11:28 AM      Failed - HDL in normal range and within 360 days    HDL  Date Value Ref Range Status  07/17/2021 39 (L) >39 mg/dL Final         Failed - Triglycerides in normal range and within 360 days    Triglycerides  Date Value Ref Range Status  07/17/2021 162 (H) 0 - 149 mg/dL Final   Triglycerides Piccolo,Waived  Date Value Ref Range Status  12/15/2016 85 <150 mg/dL Final    Comment:                            Normal                   <150                         Borderline High     150 - 199                         High                200 - 499                         Very High                >499          Passed - Total Cholesterol in normal range and within 360 days    Cholesterol, Total  Date Value Ref Range Status  07/17/2021 120 100 - 199 mg/dL Final   Cholesterol Piccolo, Waived  Date Value Ref Range Status  12/15/2016 103 <200 mg/dL Final    Comment:                            Desirable                <200                         Borderline High      200- 239                         High                     >239          Passed - LDL in normal range and within 360 days    LDL Chol Calc (NIH)  Date Value Ref Range Status  07/17/2021 54 0 - 99 mg/dL Final         Passed - Patient is not pregnant      Passed - Valid encounter within last 12 months    Recent Outpatient Visits          3 weeks ago Lower extremity edema   [facility] Vigg, Avanti, MD   4 weeks ago Lower extremity edema   [facility] Vigg, Avanti, MD  3 months ago Need for influenza vaccination   [facility] Vigg, Avanti, MD   4 months ago Heel pain, unspecified laterality   [facility] Vigg, Avanti, MD   6 months ago Nausea and vomiting, intractability of vomiting not specified, unspecified vomiting type   [place], MD      Future Appointments            In 2 weeks Vigg, Avanti, MD [facility], [facility]

## 2021-08-19 ENCOUNTER — Telehealth: Payer: Self-pay | Admitting: Internal Medicine

## 2021-08-19 NOTE — Telephone Encounter (Signed)
Carla from [place], called, asked or dx code for patient on [facility] 12/08 Please call back  ref# for her cb is [id number]

## 2021-08-21 NOTE — Telephone Encounter (Signed)
Called and discussed with [facility]. Provided correct insurance information.

## 2021-08-27 DIAGNOSIS — E109 Type 1 diabetes mellitus without complications: Secondary | ICD-10-CM | POA: Diagnosis not present

## 2021-08-27 DIAGNOSIS — Z794 Long term (current) use of insulin: Secondary | ICD-10-CM | POA: Diagnosis not present

## 2021-09-03 ENCOUNTER — Other Ambulatory Visit: Payer: Self-pay | Admitting: Internal Medicine

## 2021-09-03 ENCOUNTER — Encounter: Payer: Self-pay | Admitting: Internal Medicine

## 2021-09-03 ENCOUNTER — Other Ambulatory Visit: Payer: Self-pay

## 2021-09-03 NOTE — Telephone Encounter (Signed)
Requested Prescriptions  Pending Prescriptions Disp Refills   montelukast (SINGULAIR) 10 MG tablet [Pharmacy Med Name: MONTELUKAST 10 MG TAB[*]] 30 tablet 3    Sig: TAKE ONE TABLET BY MOUTH AT BEDTIME     Pulmonology:  Leukotriene Inhibitors Passed - 09/03/2021  3:56 AM      Passed - Valid encounter within last 12 months    Recent Outpatient Visits          1 month ago Lower extremity edema   [facility] Vigg, Avanti, MD   1 month ago Lower extremity edema   [facility] Vigg, Avanti, MD   4 months ago Need for influenza vaccination   [facility] Vigg, Avanti, MD   5 months ago Heel pain, unspecified laterality   [facility] Vigg, Avanti, MD   7 months ago Nausea and vomiting, intractability of vomiting not specified, unspecified vomiting type   [facility] Vigg, Avanti, MD      Future Appointments            In 6 days Vigg, Avanti, MD [facility], [facility]

## 2021-09-04 ENCOUNTER — Ambulatory Visit: Payer: Medicare Other | Admitting: Internal Medicine

## 2021-09-09 ENCOUNTER — Other Ambulatory Visit: Payer: Self-pay

## 2021-09-09 ENCOUNTER — Ambulatory Visit (INDEPENDENT_AMBULATORY_CARE_PROVIDER_SITE_OTHER): Payer: Medicare Other | Admitting: Internal Medicine

## 2021-09-09 ENCOUNTER — Encounter: Payer: Self-pay | Admitting: Internal Medicine

## 2021-09-09 VITALS — BP 138/77 | HR 76 | Temp 98.4°F | Ht 66.93 in | Wt 231.0 lb

## 2021-09-09 DIAGNOSIS — I10 Essential (primary) hypertension: Secondary | ICD-10-CM

## 2021-09-09 DIAGNOSIS — E7849 Other hyperlipidemia: Secondary | ICD-10-CM | POA: Diagnosis not present

## 2021-09-09 DIAGNOSIS — E119 Type 2 diabetes mellitus without complications: Secondary | ICD-10-CM | POA: Insufficient documentation

## 2021-09-09 DIAGNOSIS — E1069 Type 1 diabetes mellitus with other specified complication: Secondary | ICD-10-CM | POA: Insufficient documentation

## 2021-09-09 MED ORDER — TERBINAFINE HCL 250 MG PO TABS
250.0000 mg | ORAL_TABLET | Freq: Every day | ORAL | 0 refills | Status: DC
Start: 2021-09-09 — End: 2022-04-24

## 2021-09-09 NOTE — Progress Notes (Signed)
Ht 5' 6.93" (1.7 m)    Wt 231 lb (104.8 kg)    BMI 36.26 kg/m    Subjective:    Patient ID: Mike Kline, male    DOB: 1971-06-19, 51 y.o.   MRN: [id number]  Chief Complaint  Patient presents with   Belepharitis   Edema    Lower extermity     HPI: Mike Kline is a 51 y.o. male  Diabetes He presents for his follow-up diabetic visit. He has type 2 diabetes mellitus.  Shortness of Breath This is a chronic (and lower ext edema on demadex for such) problem. Episode onset: ho CAD has seen vas surg normal ABI.   Chief Complaint  Patient presents with   Belepharitis   Edema    Lower extermity     Relevant past medical, surgical, family and social history reviewed and updated as indicated. Interim medical history since our last visit reviewed. Allergies and medications reviewed and updated.  Review of Systems  Respiratory:  Positive for shortness of breath.    Per HPI unless specifically indicated above     Objective:    Ht 5' 6.93" (1.7 m)    Wt 231 lb (104.8 kg)    BMI 36.26 kg/m   Wt Readings from Last 3 Encounters:  09/09/21 231 lb (104.8 kg)  07/24/21 221 lb 12.8 oz (100.6 kg)  06/26/21 215 lb (97.5 kg)    Physical Exam Vitals and nursing note reviewed.  Constitutional:      General: He is not in acute distress.    Appearance: Normal appearance. He is not ill-appearing or diaphoretic.  HENT:     Head: Normocephalic and atraumatic.     Right Ear: Tympanic membrane and external ear normal. There is no impacted cerumen.     Left Ear: External ear normal.     Nose: No congestion or rhinorrhea.     Mouth/Throat:     Pharynx: No oropharyngeal exudate or posterior oropharyngeal erythema.  Eyes:     Conjunctiva/sclera: Conjunctivae normal.     Pupils: Pupils are equal, round, and reactive to light.  Cardiovascular:     Rate and Rhythm: Normal rate and regular rhythm.     Heart sounds: No murmur heard.   No friction rub. No gallop.  Pulmonary:     Effort: No  respiratory distress.     Breath sounds: No stridor. No wheezing or rhonchi.  Chest:     Chest wall: No tenderness.  Abdominal:     General: Abdomen is flat. Bowel sounds are normal.     Palpations: Abdomen is soft. There is no mass.     Tenderness: There is no abdominal tenderness.  Musculoskeletal:     Cervical back: Normal range of motion and neck supple. No rigidity or tenderness.     Left lower leg: No edema.  Skin:    General: Skin is warm and dry.  Neurological:     Mental Status: He is alert.    Results for orders placed or performed in visit on 07/17/21  Microalbumin, Urine Waived  Result Value Ref Range   Microalb, Ur Waived 30 (H) 0 - 19 mg/L   Creatinine, Urine Waived 100 10 - 300 mg/dL   Microalb/Creat Ratio <30 <30 mg/g  PSA  Result Value Ref Range   Prostate Specific Ag, Serum 0.2 0.0 - 4.0 ng/mL  TSH  Result Value Ref Range   TSH 1.600 0.450 - 4.500 uIU/mL  Hemoglobin A1c  Result  Value Ref Range   Hgb A1c MFr Bld 11.3 (H) 4.8 - 5.6 %   Est. average glucose Bld gHb Est-mCnc 278 mg/dL  Lipid panel  Result Value Ref Range   Cholesterol, Total 120 100 - 199 mg/dL   Triglycerides 162 (H) 0 - 149 mg/dL   HDL 39 (L) >39 mg/dL   VLDL Cholesterol Cal 27 5 - 40 mg/dL   LDL Chol Calc (NIH) 54 0 - 99 mg/dL   Chol/HDL Ratio 3.1 0.0 - 5.0 ratio  Comprehensive metabolic panel  Result Value Ref Range   Glucose 104 (H) 70 - 99 mg/dL   BUN 14 6 - 24 mg/dL   Creatinine, Ser 1.04 0.76 - 1.27 mg/dL   eGFR 86 >59 mL/min/1.73   BUN/Creatinine Ratio 13 9 - 20   Sodium 140 134 - 144 mmol/L   Potassium 4.5 3.5 - 5.2 mmol/L   Chloride 103 96 - 106 mmol/L   CO2 23 20 - 29 mmol/L   Calcium 9.2 8.7 - 10.2 mg/dL   Total Protein 6.6 6.0 - 8.5 g/dL   Albumin 4.2 3.8 - 4.9 g/dL   Globulin, Total 2.4 1.5 - 4.5 g/dL   Albumin/Globulin Ratio 1.8 1.2 - 2.2   Bilirubin Total <0.2 0.0 - 1.2 mg/dL   Alkaline Phosphatase 139 (H) 44 - 121 IU/L   AST 24 0 - 40 IU/L   ALT 25 0 - 44  IU/L  CBC w/Diff  Result Value Ref Range   WBC 5.4 3.4 - 10.8 x10E3/uL   RBC 5.04 4.14 - 5.80 x10E6/uL   Hemoglobin 12.0 (L) 13.0 - 17.7 g/dL   Hematocrit 39.0 37.5 - 51.0 %   MCV 77 (L) 79 - 97 fL   MCH 23.8 (L) 26.6 - 33.0 pg   MCHC 30.8 (L) 31.5 - 35.7 g/dL   RDW 14.7 11.6 - 15.4 %   Platelets 260 150 - 450 x10E3/uL   Neutrophils 63 Not Estab. %   Lymphs 24 Not Estab. %   Monocytes 9 Not Estab. %   Eos 3 Not Estab. %   Basos 1 Not Estab. %   Neutrophils Absolute 3.4 1.4 - 7.0 x10E3/uL   Lymphocytes Absolute 1.3 0.7 - 3.1 x10E3/uL   Monocytes Absolute 0.5 0.1 - 0.9 x10E3/uL   EOS (ABSOLUTE) 0.2 0.0 - 0.4 x10E3/uL   Basophils Absolute 0.0 0.0 - 0.2 x10E3/uL   Immature Granulocytes 0 Not Estab. %   Immature Grans (Abs) 0.0 0.0 - 0.1 x10E3/uL        Current Outpatient Medications:    albuterol (VENTOLIN HFA) 108 (90 Base) MCG/ACT inhaler, Inhale 2 puffs into the lungs every 6 (six) hours as needed for wheezing or shortness of breath (Cough)., Disp: 8 g, Rfl: 2   amitriptyline (ELAVIL) 100 MG tablet, TAKE ONE TABLET BY MOUTH AT BEDTIME, Disp: 30 tablet, Rfl: 3   aspirin 81 MG EC tablet, Take by mouth., Disp: , Rfl:    Continuous Blood Gluc Sensor (FREESTYLE LIBRE 2 SENSOR) MISC, PLACE 1 TO THE BACK OF YOUR UPPER ARM, REPLACE EVERY 14 DAYS, Disp: 2 each, Rfl: 0   fluticasone furoate-vilanterol (BREO ELLIPTA) 200-25 MCG/INH AEPB, Inhale 1 puff into the lungs daily., Disp: 28 each, Rfl: 2   gabapentin (NEURONTIN) 300 MG capsule, Take 1 capsule (300 mg total) by mouth 3 (three) times daily., Disp: 270 capsule, Rfl: 0   HUMALOG KWIKPEN 100 UNIT/ML KiwkPen, USE THREE TIMES A DAY AS DIRECTED.(UP TO 50 UNITS A DAY  OR AS DIRECTED BY SLIDING SCALE) (Patient taking differently: Inject 6-10 Units into the skin 3 (three) times daily.), Disp: 15 mL, Rfl: 6   insulin glargine (LANTUS SOLOSTAR) 100 UNIT/ML Solostar Pen, Inject 40 Units into the skin daily at 10 pm., Disp: , Rfl:    losartan  (COZAAR) 50 MG tablet, Take 50 mg by mouth daily., Disp: , Rfl:    metoprolol tartrate (LOPRESSOR) 25 MG tablet, Take 1 tablet (25 mg total) by mouth 2 (two) times daily., Disp: 180 tablet, Rfl: 1   montelukast (SINGULAIR) 10 MG tablet, TAKE ONE TABLET BY MOUTH AT BEDTIME, Disp: 30 tablet, Rfl: 3   ondansetron (ZOFRAN) 8 MG tablet, TAKE ONE TABLET BY MOUTH EVERY 8 HOURS AS NEEDED FOR NAUSEA AND VOMITING, Disp: 20 tablet, Rfl: 0   potassium chloride SA (KLOR-CON M) 20 MEQ tablet, Take 20 mEq by mouth 2 (two) times daily., Disp: , Rfl:    rosuvastatin (CRESTOR) 40 MG tablet, TAKE ONE TABLET BY MOUTH ONE TIME DAILY, Disp: 90 tablet, Rfl: 0   sildenafil (VIAGRA) 50 MG tablet, Take 50 mg by mouth daily as needed., Disp: , Rfl:    terbinafine (LAMISIL) 250 MG tablet, Take 1 tablet (250 mg total) by mouth daily., Disp: 90 tablet, Rfl: 0   torsemide (DEMADEX) 20 MG tablet, Take 40 mg by mouth 2 (two) times daily., Disp: , Rfl:    ALLERGY RELIEF 180 MG tablet, Take 1 tablet (180 mg total) by mouth daily., Disp: 30 tablet, Rfl: 0    Assessment & Plan:  1. CAD:  History of CABG (2020) Continue ASA , metoprolol and rosuvastatin at this time.  2.SOB : lower ext swelling :The patient should likely be followed on an annual basis with ABIs -  seen vas surg for such recommend comrpession stockings.   much improved, cut back on torsemide as tolerated. To fu with cards. Is on demadex 40 mg bid.  3. HLD : is on crestor for such  recheck FLP, check LFT's work on diet, SE of meds explained to pt. low fat and high fiber diet explained to pt.  4. DM: A1c 11.8 to see if he needs to switch endocrinologist @ [facility].  diabetic diet plan given to pt  adviced regarding hypoglycemia and instructions given to pt today on how to prevent and treat the same if it were to occur. pt acknowledges the plan and voices understanding of the same.  exercise plan given and encouraged.   advice diabetic yearly podiatry,  ophthalmology , nutritionist , dental check q 6 months   Problem List Items Addressed This Visit   None    No orders of the defined types were placed in this encounter.    No orders of the defined types were placed in this encounter.    Follow up plan: No follow-ups on file.

## 2021-09-18 ENCOUNTER — Other Ambulatory Visit: Payer: Self-pay | Admitting: Internal Medicine

## 2021-09-18 NOTE — Telephone Encounter (Signed)
Requested Prescriptions  Pending Prescriptions Disp Refills   amitriptyline (ELAVIL) 100 MG tablet [Pharmacy Med Name: AMITRIPTYLINE 100 MG TAB] 90 tablet 1    Sig: TAKE ONE TABLET BY MOUTH AT BEDTIME     Psychiatry:  Antidepressants - Heterocyclics (TCAs) Passed - 09/18/2021  1:27 PM      Passed - Completed PHQ-2 or PHQ-9 in the last 360 days      Passed - Valid encounter within last 6 months    Recent Outpatient Visits          1 week ago Diabetes mellitus without complication (HCC)   [facility] Vigg, Avanti, MD   1 month ago Lower extremity edema   [facility] Vigg, Avanti, MD   2 months ago Lower extremity edema   [facility] Vigg, Avanti, MD   4 months ago Need for influenza vaccination   [facility] Vigg, Avanti, MD   6 months ago Heel pain, unspecified laterality   [facility] Vigg, Avanti, MD      Future Appointments            In 1 month Vigg, Avanti, MD [facility], [facility]

## 2021-09-21 ENCOUNTER — Encounter: Payer: Self-pay | Admitting: Internal Medicine

## 2021-09-22 MED ORDER — AMITRIPTYLINE HCL 100 MG PO TABS: 100.0000 mg | ORAL_TABLET | Freq: Every day | ORAL | 1 refills | Status: DC

## 2021-09-22 NOTE — Telephone Encounter (Signed)
Ok to refill thnx

## 2021-09-24 ENCOUNTER — Telehealth: Payer: Self-pay | Admitting: Internal Medicine

## 2021-09-24 NOTE — Telephone Encounter (Signed)
Copied from [place] [id number]. Topic: General - Other >> Sep 24, 2021 12:17 PM Tessa Lerner A wrote: Reason for CRM: Seward Carol with [employer] has called requesting diagnosis information for patient's testing related to testing done on 07/17/21  Ref # [id number]  Please contact further when possible

## 2021-09-24 NOTE — Telephone Encounter (Signed)
Will leave for PCP to handle when she returns.

## 2021-09-29 NOTE — Telephone Encounter (Signed)
These were routine labs for his follow up can you please follow up and see what they need? Thnx.

## 2021-10-02 NOTE — Telephone Encounter (Signed)
Called [facility] and provided information.

## 2021-10-07 ENCOUNTER — Encounter: Payer: Self-pay | Admitting: Podiatry

## 2021-10-08 ENCOUNTER — Other Ambulatory Visit: Payer: Self-pay | Admitting: Podiatry

## 2021-10-13 ENCOUNTER — Other Ambulatory Visit: Payer: Self-pay | Admitting: Podiatry

## 2021-10-13 DIAGNOSIS — Z79899 Other long term (current) drug therapy: Secondary | ICD-10-CM

## 2021-10-17 DIAGNOSIS — E1029 Type 1 diabetes mellitus with other diabetic kidney complication: Secondary | ICD-10-CM | POA: Diagnosis not present

## 2021-10-17 DIAGNOSIS — E1042 Type 1 diabetes mellitus with diabetic polyneuropathy: Secondary | ICD-10-CM | POA: Diagnosis not present

## 2021-10-17 DIAGNOSIS — Z794 Long term (current) use of insulin: Secondary | ICD-10-CM | POA: Diagnosis not present

## 2021-10-17 DIAGNOSIS — E103553 Type 1 diabetes mellitus with stable proliferative diabetic retinopathy, bilateral: Secondary | ICD-10-CM | POA: Diagnosis not present

## 2021-10-17 DIAGNOSIS — R809 Proteinuria, unspecified: Secondary | ICD-10-CM | POA: Diagnosis not present

## 2021-10-17 DIAGNOSIS — E1065 Type 1 diabetes mellitus with hyperglycemia: Secondary | ICD-10-CM | POA: Diagnosis not present

## 2021-10-17 DIAGNOSIS — E1059 Type 1 diabetes mellitus with other circulatory complications: Secondary | ICD-10-CM | POA: Diagnosis not present

## 2021-10-17 DIAGNOSIS — I10 Essential (primary) hypertension: Secondary | ICD-10-CM | POA: Diagnosis not present

## 2021-10-18 ENCOUNTER — Other Ambulatory Visit: Payer: Self-pay | Admitting: Internal Medicine

## 2021-10-20 MED ORDER — GABAPENTIN 300 MG PO CAPS: 300.0000 mg | ORAL_CAPSULE | Freq: Three times a day (TID) | ORAL | 0 refills | Status: DC

## 2021-10-20 NOTE — Telephone Encounter (Signed)
Refilled 10/20/2021 #[id number] refills - Receipt confirmed by pharmacy. ?Requested Prescriptions  ?Pending Prescriptions Disp Refills  ?? gabapentin (NEURONTIN) 300 MG capsule [Pharmacy Med Name: GABAPENTIN 300 MG CAP[*]] 270 capsule 0  ?  Sig: TAKE ONE CAPSULE BY MOUTH THREE TIMES DAILY  ?  ? Neurology: Anticonvulsants - gabapentin Passed - 10/18/2021  1:33 PM  ?  ?  Passed - Cr in normal range and within 360 days  ?  Creatinine, Ser  ?Date Value Ref Range Status  ?07/17/2021 1.04 0.76 - 1.27 mg/dL Final  ?   ?  ?  Passed - Completed PHQ-2 or PHQ-9 in the last 360 days  ?  ?  Passed - Valid encounter within last 12 months  ?  Recent Outpatient Visits   ?      ? 1 month ago Diabetes mellitus without complication (HCC)  ? [facility] Vigg, Avanti, MD  ? 2 months ago Lower extremity edema  ? [facility] Vigg, Avanti, MD  ? 3 months ago Lower extremity edema  ? [facility] Vigg, Avanti, MD  ? 5 months ago Need for influenza vaccination  ? [facility] Vigg, Avanti, MD  ? 7 months ago Heel pain, unspecified laterality  ? [facility] Vigg, Avanti, MD  ?  ?  ?Future Appointments   ?        ? In 2 weeks Vigg, Avanti, MD [facility]  ?  ? ?  ?  ?  ? ?

## 2021-10-27 ENCOUNTER — Other Ambulatory Visit: Payer: Medicare Other

## 2021-10-29 DIAGNOSIS — I2510 Atherosclerotic heart disease of native coronary artery without angina pectoris: Secondary | ICD-10-CM | POA: Diagnosis not present

## 2021-10-29 DIAGNOSIS — R600 Localized edema: Secondary | ICD-10-CM | POA: Diagnosis not present

## 2021-10-29 DIAGNOSIS — R0602 Shortness of breath: Secondary | ICD-10-CM | POA: Diagnosis not present

## 2021-10-29 DIAGNOSIS — E108 Type 1 diabetes mellitus with unspecified complications: Secondary | ICD-10-CM | POA: Diagnosis not present

## 2021-11-07 ENCOUNTER — Ambulatory Visit: Payer: Medicare Other | Admitting: Internal Medicine

## 2021-11-13 ENCOUNTER — Other Ambulatory Visit: Payer: Self-pay | Admitting: Internal Medicine

## 2021-11-13 NOTE — Telephone Encounter (Signed)
Refill x 3 months, noted last OV note to return for labs around 11/07/21, appointment cancelled, no upcoming appointment. MyChart message sent to patient to call and schedule. ? ?Requested Prescriptions  ?Pending Prescriptions Disp Refills  ?? rosuvastatin (CRESTOR) 40 MG tablet [Pharmacy Med Name: ROSUVASTATIN 40 MG TAB] 90 tablet 0  ?  Sig: Take 1 tablet (40 mg total) by mouth daily. OFFICE VISIT NEEDED FOR ADDITIONAL REFILLS  ?  ? Cardiovascular:  Antilipid - Statins 2 Failed - 11/13/2021 12:05 AM  ?  ?  Failed - Lipid Panel in normal range within the last 12 months  ?  Cholesterol, Total  ?Date Value Ref Range Status  ?07/17/2021 120 100 - 199 mg/dL Final  ? ?Cholesterol Piccolo, [place]  ?Date Value Ref Range Status  ?12/15/2016 103 <200 mg/dL Final  ?  Comment:  ?                          Desirable                <200 ?                        Borderline High      200- 239 ?                        High                     >239 ?  ? ?LDL Chol Calc (NIH)  ?Date Value Ref Range Status  ?07/17/2021 54 0 - 99 mg/dL Final  ? ?HDL  ?Date Value Ref Range Status  ?07/17/2021 39 (L) >39 mg/dL Final  ? ?Triglycerides  ?Date Value Ref Range Status  ?07/17/2021 162 (H) 0 - 149 mg/dL Final  ? ?Triglycerides Piccolo,Waived  ?Date Value Ref Range Status  ?12/15/2016 85 <150 mg/dL Final  ?  Comment:  ?                          Normal                   <150 ?                        Borderline High     150 - 199 ?                        High                200 - 499 ?                        Very High                >499 ?  ? ?  ?  ?  Passed - Cr in normal range and within 360 days  ?  Creatinine, Ser  ?Date Value Ref Range Status  ?07/17/2021 1.04 0.76 - 1.27 mg/dL Final  ?   ?  ?  Passed - Patient is not pregnant  ?  ?  Passed - Valid encounter within last 12 months  ?  Recent Outpatient Visits   ?      ? 2 months ago Diabetes mellitus without complication (HCC)  ? [facility]  Vigg, Avanti, MD  ? 3 months ago Lower  extremity edema  ? [facility] Vigg, Avanti, MD  ? 3 months ago Lower extremity edema  ? [facility] Vigg, Avanti, MD  ? 6 months ago Need for influenza vaccination  ? [facility] Vigg, Avanti, MD  ? 7 months ago Heel pain, unspecified laterality  ? [facility] Vigg, Avanti, MD  ?  ?  ? ?  ?  ?  ? ?

## 2021-11-15 ENCOUNTER — Emergency Department: Payer: Medicare Other

## 2021-11-15 ENCOUNTER — Emergency Department
Admission: EM | Admit: 2021-11-15 | Discharge: 2021-11-15 | Disposition: A | Discharge status: Home / Self Care | Payer: Medicare Other | Attending: Emergency Medicine | Admitting: Emergency Medicine

## 2021-11-15 ENCOUNTER — Other Ambulatory Visit: Payer: Self-pay

## 2021-11-15 DIAGNOSIS — R112 Nausea with vomiting, unspecified: Secondary | ICD-10-CM

## 2021-11-15 DIAGNOSIS — R1084 Generalized abdominal pain: Secondary | ICD-10-CM | POA: Diagnosis not present

## 2021-11-15 DIAGNOSIS — R739 Hyperglycemia, unspecified: Secondary | ICD-10-CM

## 2021-11-15 DIAGNOSIS — R824 Acetonuria: Secondary | ICD-10-CM | POA: Insufficient documentation

## 2021-11-15 DIAGNOSIS — E1165 Type 2 diabetes mellitus with hyperglycemia: Secondary | ICD-10-CM | POA: Diagnosis not present

## 2021-11-15 DIAGNOSIS — J189 Pneumonia, unspecified organism: Secondary | ICD-10-CM

## 2021-11-15 DIAGNOSIS — R079 Chest pain, unspecified: Secondary | ICD-10-CM | POA: Diagnosis not present

## 2021-11-15 DIAGNOSIS — R140 Abdominal distension (gaseous): Secondary | ICD-10-CM | POA: Diagnosis not present

## 2021-11-15 DIAGNOSIS — I10 Essential (primary) hypertension: Secondary | ICD-10-CM | POA: Insufficient documentation

## 2021-11-15 DIAGNOSIS — E860 Dehydration: Secondary | ICD-10-CM | POA: Insufficient documentation

## 2021-11-15 DIAGNOSIS — R1110 Vomiting, unspecified: Secondary | ICD-10-CM | POA: Diagnosis not present

## 2021-11-15 DIAGNOSIS — R059 Cough, unspecified: Secondary | ICD-10-CM | POA: Diagnosis not present

## 2021-11-15 DIAGNOSIS — R000 Tachycardia, unspecified: Secondary | ICD-10-CM | POA: Insufficient documentation

## 2021-11-15 DIAGNOSIS — I700 Atherosclerosis of aorta: Secondary | ICD-10-CM

## 2021-11-15 DIAGNOSIS — R5383 Other fatigue: Secondary | ICD-10-CM | POA: Diagnosis not present

## 2021-11-15 DIAGNOSIS — R109 Unspecified abdominal pain: Secondary | ICD-10-CM | POA: Diagnosis not present

## 2021-11-15 DIAGNOSIS — R911 Solitary pulmonary nodule: Secondary | ICD-10-CM

## 2021-11-15 LAB — COMPREHENSIVE METABOLIC PANEL
ALT: 22 U/L (ref 0–44)
AST: 29 U/L (ref 15–41)
Albumin: 3.8 g/dL (ref 3.5–5.0)
Alkaline Phosphatase: 104 U/L (ref 38–126)
Anion gap: 12 (ref 5–15)
BUN: 17 mg/dL (ref 6–20)
CO2: 24 mmol/L (ref 22–32)
Calcium: 9.3 mg/dL (ref 8.9–10.3)
Chloride: 97 mmol/L — ABNORMAL LOW (ref 98–111)
Creatinine, Ser: 1.16 mg/dL (ref 0.61–1.24)
GFR, Estimated: >60 mL/min (ref >60)
Glucose, Bld: 361 mg/dL — ABNORMAL HIGH (ref 70–99)
Potassium: 4.7 mmol/L (ref 3.5–5.1)
Sodium: 133 mmol/L — ABNORMAL LOW (ref 135–145)
Total Bilirubin: 1 mg/dL (ref 0.3–1.2)
Total Protein: 8.1 g/dL (ref 6.5–8.1)

## 2021-11-15 LAB — URINALYSIS, ROUTINE W REFLEX MICROSCOPIC
Bacteria, UA: NONE SEEN
Bilirubin Urine: NEGATIVE
Glucose, UA: >=500 mg/dL — AB
Hgb urine dipstick: NEGATIVE
Ketones, ur: 80 mg/dL — AB
Leukocytes,Ua: NEGATIVE
Nitrite: NEGATIVE
Protein, ur: NEGATIVE mg/dL
Specific Gravity, Urine: 1.022 (ref 1.005–1.030)
pH: 6 (ref 5.0–8.0)

## 2021-11-15 LAB — CBC
HCT: 37 % — ABNORMAL LOW (ref 39.0–52.0)
Hemoglobin: 11.9 g/dL — ABNORMAL LOW (ref 13.0–17.0)
MCH: 25.4 pg — ABNORMAL LOW (ref 26.0–34.0)
MCHC: 32.2 g/dL (ref 30.0–36.0)
MCV: 79.1 fL — ABNORMAL LOW (ref 80.0–100.0)
Platelets: 274 10*3/uL (ref 150–400)
RBC: 4.68 MIL/uL (ref 4.22–5.81)
RDW: 14.2 % (ref 11.5–15.5)
WBC: 6.6 10*3/uL (ref 4.0–10.5)
nRBC: 0 % (ref 0.0–0.2)

## 2021-11-15 LAB — CK: Total CK: 289 U/L (ref 49–397)

## 2021-11-15 LAB — CBG MONITORING, ED: Glucose-Capillary: 348 mg/dL — ABNORMAL HIGH (ref 70–99)

## 2021-11-15 LAB — LIPASE, BLOOD: Lipase: 19 U/L (ref 11–51)

## 2021-11-15 MED ORDER — SODIUM CHLORIDE 0.9 % IV BOLUS
1000.0000 mL | Freq: Once | INTRAVENOUS | Status: AC
Start: 2021-11-15 — End: 2021-11-15
  Administered 2021-11-15: 1000 mL via INTRAVENOUS

## 2021-11-15 MED ORDER — ONDANSETRON 4 MG PO TBDP: 4.0000 mg | ORAL_TABLET | Freq: Three times a day (TID) | ORAL | 0 refills | Status: AC | PRN

## 2021-11-15 MED ORDER — DOXYCYCLINE HYCLATE 100 MG PO CAPS: 100.0000 mg | ORAL_CAPSULE | Freq: Two times a day (BID) | ORAL | 0 refills | Status: AC

## 2021-11-15 MED ORDER — METOCLOPRAMIDE HCL 10 MG PO TABS
10.0000 mg | ORAL_TABLET | Freq: Four times a day (QID) | ORAL | 0 refills | Status: DC | PRN
Start: 2021-11-15 — End: 2022-07-24

## 2021-11-15 MED ORDER — METOCLOPRAMIDE HCL 5 MG/ML IJ SOLN
10.0000 mg | INTRAMUSCULAR | Status: AC
  Administered 2021-11-15: 10 mg via INTRAVENOUS
  Filled 2021-11-15: qty 2

## 2021-11-15 MED ORDER — KETOROLAC TROMETHAMINE 15 MG/ML IJ SOLN
15.0000 mg | Freq: Once | INTRAMUSCULAR | Status: AC
  Administered 2021-11-15: 15 mg via INTRAVENOUS
  Filled 2021-11-15: qty 1

## 2021-11-15 MED ORDER — SODIUM CHLORIDE 0.9 % IV BOLUS
1000.0000 mL | Freq: Once | INTRAVENOUS | Status: AC
  Administered 2021-11-15: 1000 mL via INTRAVENOUS

## 2021-11-15 MED ORDER — IOHEXOL 350 MG/ML SOLN
100.0000 mL | Freq: Once | INTRAVENOUS | Status: AC | PRN
Start: 2021-11-15 — End: 2021-11-15
  Administered 2021-11-15: 100 mL via INTRAVENOUS

## 2021-11-15 MED ORDER — ONDANSETRON HCL 4 MG/2ML IJ SOLN
4.0000 mg | Freq: Once | INTRAMUSCULAR | Status: AC
  Administered 2021-11-15: 4 mg via INTRAVENOUS
  Filled 2021-11-15: qty 2

## 2021-11-15 MED ORDER — LACTATED RINGERS IV BOLUS
1000.0000 mL | Freq: Once | INTRAVENOUS | Status: AC
  Administered 2021-11-15: 1000 mL via INTRAVENOUS

## 2021-11-15 MED ORDER — CEFTRIAXONE SODIUM 1 G IJ SOLR
1.0000 g | Freq: Once | INTRAMUSCULAR | Status: AC
  Administered 2021-11-15: 1 g via INTRAVENOUS
  Filled 2021-11-15: qty 10

## 2021-11-15 MED ORDER — NAPROXEN 500 MG PO TABS: 500.0000 mg | ORAL_TABLET | Freq: Two times a day (BID) | ORAL | 0 refills | Status: DC

## 2021-11-15 MED ORDER — SODIUM CHLORIDE 0.9 % IV SOLN
500.0000 mg | Freq: Once | INTRAVENOUS | Status: AC
  Administered 2021-11-15: 500 mg via INTRAVENOUS
  Filled 2021-11-15: qty 5

## 2021-11-15 MED ORDER — PANTOPRAZOLE SODIUM 40 MG IV SOLR
40.0000 mg | Freq: Once | INTRAVENOUS | Status: AC
  Administered 2021-11-15: 40 mg via INTRAVENOUS
  Filled 2021-11-15: qty 10

## 2021-11-15 MED ORDER — FAMOTIDINE 20 MG PO TABS
20.0000 mg | ORAL_TABLET | Freq: Two times a day (BID) | ORAL | 0 refills | Status: DC
Start: 2021-11-15 — End: 2022-04-24

## 2021-11-15 MED ORDER — INSULIN ASPART 100 UNIT/ML IJ SOLN
10.0000 [IU] | Freq: Once | INTRAMUSCULAR | Status: AC
  Administered 2021-11-15: 10 [IU] via INTRAVENOUS
  Filled 2021-11-15: qty 1

## 2021-11-15 NOTE — ED Notes (Signed)
Provider Z. Smith to bedside to update pt.  ?

## 2021-11-15 NOTE — ED Notes (Signed)
EDP Stafford notified via secure chat of HR 118 and BP 152/69.  ?

## 2021-11-15 NOTE — ED Triage Notes (Signed)
Pt to ED with partner for nausea, vomiting for last 2 days. Pt feels weak. Pt has cough and congestion since past week. Home covid test was negative. They have tried zofran and phenergan, unable to hold down. Unable to hold down water, ice, saltine crackers or gatorade. Pt also has no BM since 24 hours and usually has daily BM. Hx quadruple bypass in 2020. ? ?Pt has glucose monitor to L upper arm.  ?BG currently 369 on home meter. Did not take the lantus last night because could not eat anything.  ? ?EKG performed at this time. ?

## 2021-11-15 NOTE — ED Provider Notes (Signed)
? ?[facility] ?Provider Note ? ? ? Event Date/Time  ? First MD Initiated Contact with Patient 11/15/21 1217   ?  (approximate) ? ? ?History  ? ?Emesis and Weakness ? ? ?HPI ? ?Mike Kline is a 51 y.o. male with a past history of GERD, hypertension, diabetes who comes ED complaining of cough, congestion, fatigue for the past week at home.  Over the last 2 days his symptoms have become worse and he is now also having nausea vomiting and inability to eat or drink anything.  Bowel movements have been normal up until yesterday, he has not had a bowel movement in last 24 hours. ? ?Denies any focal pain or severe shortness of breath.  No pleuritic chest pain. ?  ? ? ?Physical Exam  ? ?Triage Vital Signs: ?ED Triage Vitals  ?Enc Vitals Group  ?   BP 11/15/21 1122 (!) 149/79  ?   Pulse Rate 11/15/21 1122 (!) 106  ?   Resp 11/15/21 1122 16  ?   Temp 11/15/21 1122 98.3 ?F (36.8 ?C)  ?   Temp Source 11/15/21 1122 Oral  ?   SpO2 11/15/21 1122 97 %  ?   Weight 11/15/21 1126 224 lb (101.6 kg)  ?   Height 11/15/21 1126 5\' 7"  (1.702 m)  ?   Head Circumference --   ?   Peak Flow --   ?   Pain Score 11/15/21 1126 4  ?   Pain Loc --   ?   Pain Edu? --   ?   Excl. in GC? --   ? ? ?Most recent vital signs: ?Vitals:  ? 11/15/21 1445 11/15/21 1500  ?BP:  (!) 144/60  ?Pulse: (!) 113   ?Resp:    ?Temp:    ?SpO2:  100%  ? ? ? ?General: Awake, no distress.  ?CV:  Good peripheral perfusion.  Tachycardia heart rate 110.  Normal radial and dorsalis pedis pulses ?Resp:  Normal effort.  Clear to auscultation bilaterally.  Coarse, frequent cough ?Abd:  Mildly distended.  Mild generalized tenderness, no focal findings.  Not peritoneal nor tympanitic ?Other:  No lower extremity edema or calf tenderness.  No rash. ? ? ?ED Results / Procedures / Treatments  ? ?Labs ?(all labs ordered are listed, but only abnormal results are displayed) ?Labs Reviewed  ?COMPREHENSIVE METABOLIC PANEL - Abnormal; Notable for the following  components:  ?    Result Value  ? Sodium 133 (*)   ? Chloride 97 (*)   ? Glucose, Bld 361 (*)   ? All other components within normal limits  ?CBC - Abnormal; Notable for the following components:  ? Hemoglobin 11.9 (*)   ? HCT 37.0 (*)   ? MCV 79.1 (*)   ? MCH 25.4 (*)   ? All other components within normal limits  ?CBG MONITORING, ED - Abnormal; Notable for the following components:  ? Glucose-Capillary 348 (*)   ? All other components within normal limits  ?LIPASE, BLOOD  ?URINALYSIS, ROUTINE W REFLEX MICROSCOPIC  ? ? ? ?EKG ? ?Interpreted by me ?Sinus tachycardia rate 110.  Normal axis, normal intervals.  Normal QRS ST segments and T waves.  No ischemic changes. ? ? ?RADIOLOGY ?X-ray abdomen and chest viewed and interpreted by me, appears normal. ? ? ? ?PROCEDURES: ? ?Critical Care performed: No ? ?Procedures ? ? ?MEDICATIONS ORDERED IN ED: ?Medications  ?sodium chloride 0.9 % bolus 1,000 mL (0 mLs Intravenous Stopped 11/15/21 1325)  ?  ondansetron (ZOFRAN) injection 4 mg (4 mg Intravenous Given 11/15/21 1147)  ?ketorolac (TORADOL) 15 MG/ML injection 15 mg (15 mg Intravenous Given 11/15/21 1415)  ?metoCLOPramide (REGLAN) injection 10 mg (10 mg Intravenous Given 11/15/21 1412)  ?pantoprazole (PROTONIX) injection 40 mg (40 mg Intravenous Given 11/15/21 1410)  ?sodium chloride 0.9 % bolus 1,000 mL (1,000 mLs Intravenous New Bag/Given 11/15/21 1409)  ? ? ? ?IMPRESSION / MDM / ASSESSMENT AND PLAN / ED COURSE  ?I reviewed the triage vital signs and the nursing notes. ?             ?               ? ?Differential diagnosis includes, but is not limited to, dehydration, electrolyte abnormality, pneumonia, viral syndrome, bowel obstruction ? ?Patient presents with cough and congestion which is progressed to nausea vomiting and generalized abdominal pain, highly suspicious for a viral syndrome.  Patient feeling a little bit better after receiving 1 L IV fluid bolus and Zofran, will give additional Protonix, Reglan, Toradol, fluids for  supportive care.  Will obtain x-ray of chest and abdomen. ? ? ?Clinical Course as of 11/15/21 1543  ?Sat Nov 15, 2021  ?[id number] Labs are all reassuring.  Patient ambulatory.  Has persistent CP + AP, chest wall tenderness, tachycardia. Will obtain CT to r/o PE, SBO. [PS]  ?  ?Clinical Course User Index ?[PS] Sharman Cheek, MD  ? ? ? ?FINAL CLINICAL IMPRESSION(S) / ED DIAGNOSES  ? ?Final diagnoses:  ?Dehydration  ?Nausea and vomiting, unspecified vomiting type  ? ? ? ?Rx / DC Orders  ? ?ED Discharge Orders   ? ?      Ordered  ?  famotidine (PEPCID) 20 MG tablet  2 times daily       ? 11/15/21 1511  ?  metoCLOPramide (REGLAN) 10 MG tablet  Every 6 hours PRN       ? 11/15/21 1511  ?  naproxen (NAPROSYN) 500 MG tablet  2 times daily with meals       ? 11/15/21 1511  ? ?  ?  ? ?  ? ? ? ?Note:  This document was prepared using Dragon voice recognition software and may include unintentional dictation errors. ?  ?Sharman Cheek, MD ?11/15/21 1512 ? ?

## 2021-11-15 NOTE — ED Notes (Signed)
Pt leaving for CT.  

## 2021-11-15 NOTE — ED Notes (Signed)
Spoke with Dr Scotty Court, ok to place IV and give 1L bolus and put pt in subwait. ?

## 2021-11-15 NOTE — ED Notes (Addendum)
Pt back from CT. Visitor remains at bedside.  ?

## 2021-11-15 NOTE — ED Notes (Signed)
Pt up to bedside toilet with urine specimen cup; steady; visitor at bedside.  ?

## 2021-11-15 NOTE — ED Provider Notes (Signed)
I assumed care of this patient approximately 1500.  Please see outgoing progress note for full details on patient's initial evaluation assessment.  In brief patient presents for about a week of cough congestion and fatigue with some nausea and vomiting developed over the last day or so. ? ?He is tachycardic on arrival with otherwise stable vitals.  CMP shows hyperglycemia without any acidosis or significant evidence of other electrolyte or metabolic derangements.  Not consistent with DKA.  Lipase not suggestive of pancreatitis.  CBC shows no leukocytosis and stable hemoglobin.  CK is within normal limits.  UA shows some ketones and glucose but otherwise unremarkable.  At time of signout patient is pending CT of the chest abdomen pelvis. ? ?CT chest abdomen pelvis on my interpretation shows what appears to be some groundglass opacification in the right lower lobe concerning for pneumonia.  There is no PE, effusion, pulmonary edema or other acute abdominal process.  I also reviewed radiology interpretation and agree with the findings of opacity in the right lower lobe as well as a stable 5 mm right lower lobe nodule and aortic atherosclerosis without other acute process. ? ?On reassessment patient is feeling little better and able tolerate p.o.  Discussed concern for community-acquired pneumonia.  Given he will not be able to get prescriptions tonight we will give a dose of Rocephin and azithromycin to cover until tomorrow where he can start a 5-day course of doxycycline.  Will prescribe Rx for ODT Zofran.  He is declining any Tessalon.  Recommended close outpatient follow-up.  Discussed returning for any new or worsening of symptoms.  Discharged in stable condition. ? ? ?  ?Lucrezia Starch, MD ?11/15/21 1721 ? ?

## 2021-11-15 NOTE — ED Notes (Signed)
Pt given water and crackers  

## 2021-11-15 NOTE — ED Notes (Addendum)
See triage note. Pt reports is here because he "cannot get up without vomiting for the past few days". Dry cough present. Pt ambulated to room; steady. Pt reports have vomited so much that all he can do now is dry-heave because there is nothing left to vomit up. Pt has emesis bag but is not currently vomiting; cough is strong and frequent. Both of pt's upper arms are red and hot. 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 Pt has call bell; stretcher locked low; rail up.  ?

## 2021-11-15 NOTE — Discharge Instructions (Addendum)
CT of your Chest, Abdomen, and Pelvis today showed: ?IMPRESSION: ?1. No CT evidence for acute pulmonary embolus. ?2. New small focus of patchy ground-glass attenuation in the right ?lower lobe, likely infectious/inflammatory alveolitis. ?3. Stable 5 mm right lower lobe pulmonary nodule. This is also ?stable comparing back to 05/17/2019 consistent with benign etiology. ?No followup recommended. ?4. No acute findings in the abdomen or pelvis. No findings to ?explain the patient's history of nausea and vomiting. ?5. Aortic Atherosclerosis (ICD10-I70.0). ?

## 2021-11-15 NOTE — ED Notes (Signed)
Pt taken out to his car in wheelchair.  ?

## 2021-11-15 NOTE — ED Notes (Signed)
Pt away at imaging.

## 2021-11-27 ENCOUNTER — Ambulatory Visit: Payer: Self-pay

## 2021-11-27 NOTE — Telephone Encounter (Addendum)
Debbie calling from [facility] is calling to see if the pt has had a colorectal exam  ?[phone]. ? ?Debbie with [facility] asking if pt. Has had a Colo guard or stool card screening done. Did not see any documentation. Please advise.  ?

## 2021-11-28 NOTE — Telephone Encounter (Signed)
Debbie from [facility] returned the call to our office in regards to Colorectal Screening. Eunice Blase was informed that patient has not had any colorectal screening according to his chart.  ?

## 2021-11-28 NOTE — Telephone Encounter (Signed)
Left voice mail for Mike Kline from [facility] to return call ?

## 2021-12-02 DIAGNOSIS — E109 Type 1 diabetes mellitus without complications: Secondary | ICD-10-CM | POA: Diagnosis not present

## 2021-12-02 DIAGNOSIS — Z794 Long term (current) use of insulin: Secondary | ICD-10-CM | POA: Diagnosis not present

## 2022-01-01 DIAGNOSIS — Z794 Long term (current) use of insulin: Secondary | ICD-10-CM | POA: Diagnosis not present

## 2022-01-01 DIAGNOSIS — E109 Type 1 diabetes mellitus without complications: Secondary | ICD-10-CM | POA: Diagnosis not present

## 2022-01-06 ENCOUNTER — Other Ambulatory Visit: Payer: Self-pay | Admitting: Internal Medicine

## 2022-01-07 NOTE — Telephone Encounter (Signed)
Requested Prescriptions  Pending Prescriptions Disp Refills  . montelukast (SINGULAIR) 10 MG tablet [Pharmacy Med Name: MONTELUKAST 10 MG TAB[*]] 90 tablet 0    Sig: TAKE ONE TABLET BY MOUTH AT BEDTIME     Pulmonology:  Leukotriene Inhibitors Passed - 01/06/2022 12:16 PM      Passed - Valid encounter within last 12 months    Recent Outpatient Visits          4 months ago Diabetes mellitus without complication (HCC)   [facility] Vigg, Avanti, MD   5 months ago Lower extremity edema   [facility] Vigg, Avanti, MD   5 months ago Lower extremity edema   [facility] Vigg, Avanti, MD   8 months ago Need for influenza vaccination   [facility] Vigg, Avanti, MD   9 months ago Heel pain, unspecified laterality   [facility] Vigg, Avanti, MD

## 2022-01-14 ENCOUNTER — Other Ambulatory Visit: Payer: Self-pay | Admitting: Internal Medicine

## 2022-01-14 MED ORDER — GABAPENTIN 300 MG PO CAPS: 300.0000 mg | ORAL_CAPSULE | Freq: Three times a day (TID) | ORAL | 0 refills | Status: DC

## 2022-01-14 NOTE — Telephone Encounter (Signed)
Receipt confirmed by pharmacy 01/14/22  at 11:55. Requested Prescriptions  Pending Prescriptions Disp Refills  . gabapentin (NEURONTIN) 300 MG capsule [Pharmacy Med Name: GABAPENTIN 300 MG CAP[*]] 270 capsule 0    Sig: TAKE ONE CAPSULE BY MOUTH THREE TIMES A DAY     Neurology: Anticonvulsants - gabapentin Passed - 01/14/2022 10:47 AM      Passed - Cr in normal range and within 360 days    Creatinine, Ser  Date Value Ref Range Status  11/15/2021 1.16 0.61 - 1.24 mg/dL Final         Passed - Completed PHQ-2 or PHQ-9 in the last 360 days      Passed - Valid encounter within last 12 months    Recent Outpatient Visits          4 months ago Diabetes mellitus without complication (HCC)   [facility] Vigg, Avanti, MD   5 months ago Lower extremity edema   [facility] Vigg, Avanti, MD   6 months ago Lower extremity edema   [facility] Vigg, Avanti, MD   8 months ago Need for influenza vaccination   [facility] Vigg, Avanti, MD   10 months ago Heel pain, unspecified laterality   [facility] Vigg, Avanti, MD

## 2022-01-28 DIAGNOSIS — I2510 Atherosclerotic heart disease of native coronary artery without angina pectoris: Secondary | ICD-10-CM | POA: Diagnosis not present

## 2022-01-28 DIAGNOSIS — R600 Localized edema: Secondary | ICD-10-CM | POA: Diagnosis not present

## 2022-01-28 DIAGNOSIS — I509 Heart failure, unspecified: Secondary | ICD-10-CM | POA: Diagnosis not present

## 2022-01-28 DIAGNOSIS — I10 Essential (primary) hypertension: Secondary | ICD-10-CM | POA: Diagnosis not present

## 2022-01-28 DIAGNOSIS — I700 Atherosclerosis of aorta: Secondary | ICD-10-CM | POA: Diagnosis not present

## 2022-01-28 DIAGNOSIS — R5383 Other fatigue: Secondary | ICD-10-CM | POA: Diagnosis not present

## 2022-01-28 DIAGNOSIS — R0602 Shortness of breath: Secondary | ICD-10-CM | POA: Diagnosis not present

## 2022-01-28 DIAGNOSIS — E108 Type 1 diabetes mellitus with unspecified complications: Secondary | ICD-10-CM | POA: Diagnosis not present

## 2022-01-28 DIAGNOSIS — E782 Mixed hyperlipidemia: Secondary | ICD-10-CM | POA: Diagnosis not present

## 2022-01-28 DIAGNOSIS — Z951 Presence of aortocoronary bypass graft: Secondary | ICD-10-CM | POA: Diagnosis not present

## 2022-01-28 DIAGNOSIS — L03116 Cellulitis of left lower limb: Secondary | ICD-10-CM | POA: Diagnosis not present

## 2022-02-01 DIAGNOSIS — E109 Type 1 diabetes mellitus without complications: Secondary | ICD-10-CM | POA: Diagnosis not present

## 2022-02-01 DIAGNOSIS — Z794 Long term (current) use of insulin: Secondary | ICD-10-CM | POA: Diagnosis not present

## 2022-02-09 DIAGNOSIS — Z794 Long term (current) use of insulin: Secondary | ICD-10-CM | POA: Diagnosis not present

## 2022-02-09 DIAGNOSIS — E1029 Type 1 diabetes mellitus with other diabetic kidney complication: Secondary | ICD-10-CM | POA: Diagnosis not present

## 2022-02-09 DIAGNOSIS — R809 Proteinuria, unspecified: Secondary | ICD-10-CM | POA: Diagnosis not present

## 2022-02-09 DIAGNOSIS — I10 Essential (primary) hypertension: Secondary | ICD-10-CM | POA: Diagnosis not present

## 2022-02-09 DIAGNOSIS — E103553 Type 1 diabetes mellitus with stable proliferative diabetic retinopathy, bilateral: Secondary | ICD-10-CM | POA: Diagnosis not present

## 2022-02-09 DIAGNOSIS — E1042 Type 1 diabetes mellitus with diabetic polyneuropathy: Secondary | ICD-10-CM | POA: Diagnosis not present

## 2022-02-09 DIAGNOSIS — E1065 Type 1 diabetes mellitus with hyperglycemia: Secondary | ICD-10-CM | POA: Diagnosis not present

## 2022-02-09 DIAGNOSIS — E1059 Type 1 diabetes mellitus with other circulatory complications: Secondary | ICD-10-CM | POA: Diagnosis not present

## 2022-03-07 DIAGNOSIS — Z794 Long term (current) use of insulin: Secondary | ICD-10-CM | POA: Diagnosis not present

## 2022-03-07 DIAGNOSIS — E109 Type 1 diabetes mellitus without complications: Secondary | ICD-10-CM | POA: Diagnosis not present

## 2022-03-10 ENCOUNTER — Other Ambulatory Visit: Payer: Self-pay

## 2022-03-10 MED ORDER — AMITRIPTYLINE HCL 100 MG PO TABS: 100.0000 mg | ORAL_TABLET | Freq: Every day | ORAL | 0 refills | Status: DC

## 2022-03-10 NOTE — Telephone Encounter (Signed)
LVM asking patient to call back to schedule a follow up appt

## 2022-03-10 NOTE — Telephone Encounter (Signed)
LOV 09/09/21  No future appt noted

## 2022-03-18 ENCOUNTER — Telehealth: Payer: Self-pay

## 2022-03-18 NOTE — Telephone Encounter (Signed)
Called patient and LVM to return call to set up appt for refills

## 2022-03-23 ENCOUNTER — Other Ambulatory Visit: Payer: Self-pay

## 2022-03-23 MED ORDER — ROSUVASTATIN CALCIUM 40 MG PO TABS: 40.0000 mg | ORAL_TABLET | Freq: Every day | ORAL | 0 refills | Status: DC

## 2022-03-26 NOTE — Telephone Encounter (Signed)
Lvm asking patient to call back to schedule follow up visit

## 2022-03-31 NOTE — Telephone Encounter (Signed)
2nd attempt to reach patient.

## 2022-04-14 ENCOUNTER — Other Ambulatory Visit: Payer: Self-pay

## 2022-04-14 MED ORDER — GABAPENTIN 300 MG PO CAPS: 300.0000 mg | ORAL_CAPSULE | Freq: Three times a day (TID) | ORAL | 0 refills | Status: DC

## 2022-04-14 NOTE — Telephone Encounter (Signed)
LOV 09/09/21  No future appt

## 2022-04-14 NOTE — Telephone Encounter (Signed)
LVM asking patient to call back to schedule an appointment 

## 2022-04-17 NOTE — Telephone Encounter (Signed)
2nd attempt to reach patient.

## 2022-04-21 ENCOUNTER — Other Ambulatory Visit: Payer: Self-pay | Admitting: Nurse Practitioner

## 2022-04-22 NOTE — Telephone Encounter (Signed)
Requested medication (s) are due for refill today: yes  Requested medication (s) are on the active medication list: yes  Last refill:  amitriptyline: 03/10/22            rosuvastatin: 03/23/22 #30  Future visit scheduled: no- sent pt MyChart message to call and make appt-   Notes to clinic:  needs appointment   Requested Prescriptions  Pending Prescriptions Disp Refills   amitriptyline (ELAVIL) 100 MG tablet [Pharmacy Med Name: AMITRIPTYLINE 100 MG TAB] 30 tablet 0    Sig: TAKE ONE TABLET BY MOUTH AT BEDTIME **NEED APPOINTMENT FOR FURTHER REFILLS**     Psychiatry:  Antidepressants - Heterocyclics (TCAs) Failed - 04/21/2022  2:17 AM      Failed - Valid encounter within last 6 months    Recent Outpatient Visits           7 months ago Diabetes mellitus without complication (HCC)   [facility] Vigg, Avanti, MD   9 months ago Lower extremity edema   [facility] Vigg, Avanti, MD   9 months ago Lower extremity edema   [facility] Vigg, Avanti, MD   12 months ago Need for influenza vaccination   [facility] Vigg, Avanti, MD   1 year ago Heel pain, unspecified laterality   [facility] Vigg, Avanti, MD              Passed - Completed PHQ-2 or PHQ-9 in the last 360 days       rosuvastatin (CRESTOR) 40 MG tablet [Pharmacy Med Name: ROSUVASTATIN 40 MG TAB[*]] 30 tablet 0    Sig: TAKE ONE TABLET BY MOUTH ONE TIME DAILY. OFFICE VISIT NEEDED FOR ADDITIONAL REFILLS     Cardiovascular:  Antilipid - Statins 2 Failed - 04/21/2022  2:17 AM      Failed - Lipid Panel in normal range within the last 12 months    Cholesterol, Total  Date Value Ref Range Status  07/17/2021 120 100 - 199 mg/dL Final   Cholesterol Piccolo, Waived  Date Value Ref Range Status  12/15/2016 103 <200 mg/dL Final    Comment:                            Desirable                <200                         Borderline High      200- 239                          High                     >239    LDL Chol Calc (NIH)  Date Value Ref Range Status  07/17/2021 54 0 - 99 mg/dL Final   HDL  Date Value Ref Range Status  07/17/2021 39 (L) >39 mg/dL Final   Triglycerides  Date Value Ref Range Status  07/17/2021 162 (H) 0 - 149 mg/dL Final   Triglycerides Piccolo,Waived  Date Value Ref Range Status  12/15/2016 85 <150 mg/dL Final    Comment:                            Normal                   <  150                         Borderline High     150 - 199                         High                200 - 499                         Very High                >499          Passed - Cr in normal range and within 360 days    Creatinine, Ser  Date Value Ref Range Status  11/15/2021 1.16 0.61 - 1.24 mg/dL Final         Passed - Patient is not pregnant      Passed - Valid encounter within last 12 months    Recent Outpatient Visits           7 months ago Diabetes mellitus without complication (HCC)   [facility] Vigg, Avanti, MD   9 months ago Lower extremity edema   [facility] Vigg, Avanti, MD   9 months ago Lower extremity edema   [facility] Vigg, Avanti, MD   12 months ago Need for influenza vaccination   [facility] Vigg, Avanti, MD   1 year ago Heel pain, unspecified laterality   [facility] Vigg, Avanti, MD

## 2022-04-24 ENCOUNTER — Encounter: Payer: Self-pay | Admitting: Physician Assistant

## 2022-04-24 ENCOUNTER — Ambulatory Visit (INDEPENDENT_AMBULATORY_CARE_PROVIDER_SITE_OTHER): Payer: Medicare Other | Admitting: Physician Assistant

## 2022-04-24 VITALS — BP 139/81 | HR 90 | Temp 97.9°F | Ht 67.01 in | Wt 238.4 lb

## 2022-04-24 DIAGNOSIS — I10 Essential (primary) hypertension: Secondary | ICD-10-CM | POA: Diagnosis not present

## 2022-04-24 DIAGNOSIS — E1042 Type 1 diabetes mellitus with diabetic polyneuropathy: Secondary | ICD-10-CM

## 2022-04-24 DIAGNOSIS — E1051 Type 1 diabetes mellitus with diabetic peripheral angiopathy without gangrene: Secondary | ICD-10-CM | POA: Diagnosis not present

## 2022-04-24 DIAGNOSIS — G4709 Other insomnia: Secondary | ICD-10-CM | POA: Diagnosis not present

## 2022-04-24 MED ORDER — GABAPENTIN 300 MG PO CAPS: 300.0000 mg | ORAL_CAPSULE | Freq: Three times a day (TID) | ORAL | 2 refills | Status: DC

## 2022-04-24 MED ORDER — METOPROLOL TARTRATE 25 MG PO TABS: 25.0000 mg | ORAL_TABLET | Freq: Two times a day (BID) | ORAL | 1 refills | Status: DC

## 2022-04-24 MED ORDER — LOSARTAN POTASSIUM 50 MG PO TABS: 50.0000 mg | ORAL_TABLET | Freq: Every day | ORAL | 1 refills | Status: DC

## 2022-04-24 MED ORDER — AMITRIPTYLINE HCL 100 MG PO TABS: 100.0000 mg | ORAL_TABLET | Freq: Every day | ORAL | 2 refills | Status: DC

## 2022-04-24 NOTE — Progress Notes (Signed)
Established Patient Office Visit  Name: Mike Kline   MRN: [id number]    DOB: 1971-02-27   Date:04/24/2022  Today's Provider: Jacquelin Hawking, MHS, PA-C Introduced myself to the patient as a PA-C and provided education on APPs in clinical practice.         Subjective  Chief Complaint  Chief Complaint  Patient presents with   Medication Refill   Hypertension    Medication Refill Associated symptoms include a rash. Pertinent negatives include no chest pain, headaches, nausea or vomiting.  Hypertension Associated symptoms include malaise/fatigue. Pertinent negatives include no blurred vision, chest pain, headaches or palpitations.    Hypertension: - Medications: Losartan 50 mg PO QD, Metoprolol 25 mg PO BID  - Compliance: Excellent - Checking BP at home: not checking at home  - Denies any SOB, CP, vision changes, LE edema, medication SEs, or symptoms of hypotension - Diet: Trying to eat less, not restricting any portions of diet  - Exercise: Not engaged in regular exercise  Last A1c was 10.8 on 02/09/22- seen by [facility] for DM Has been following his Endocrinologist's instructions a bit more thoroughly and feels like BG is improving   He is followed by Cardiology for heart- was recently seen there and treated for Cellulitis in June 2023    Insomnia Reports it is okay Sleeping about 6-7 hours per night Does not feel well rested in the AM every day- some days are better than others  Reports he is tired all day most days   Patient Active Problem List   Diagnosis Date Noted   Diabetes mellitus without complication (HCC) 09/09/2021   Other hyperlipidemia 09/09/2021   Lower extremity edema 07/21/2021   DM type 1 with diabetic peripheral neuropathy (HCC) 07/12/2021   Need for influenza vaccination 04/24/2021   Heel pain 03/19/2021   Moderate asthma without complication 03/19/2021   Chronic cough 02/12/2021   Aortic atherosclerosis (HCC) 11/08/2020   Blindness of  left eye 07/02/2020   Depression 07/02/2020   Respiratory failure (HCC) 06/13/2020   Upper respiratory tract infection 06/03/2020   ASCVD (arteriosclerotic cardiovascular disease) 09/13/2019   Acute blood loss anemia 06/07/2019   Acute postoperative pain 06/07/2019   AKI (acute kidney injury) (HCC) 06/07/2019   Hyponatremia 06/07/2019   Ileus (HCC) 06/07/2019   Physical deconditioning 06/07/2019   PONV (postoperative nausea and vomiting) 06/07/2019   S/P CABG (coronary artery bypass graft) 06/03/2019   Coronary atherosclerosis due to lipid rich plaque 05/23/2019   Anxiety 04/14/2019   Insomnia 05/28/2016   Adhesive capsulitis 12/17/2015   Hypercholesterolemia 10/22/2015   Diabetic, retinopathy, proliferative (HCC) 05/28/2015   Diabetes mellitus type 1 with peripheral artery disease (HCC) 05/28/2015   Hypertension    Type I diabetes mellitus with manifestations, uncontrolled (HCC)    GERD (gastroesophageal reflux disease)    Nuclear sclerotic cataract 07/05/2013   Type 1 diabetes mellitus (HCC) 05/29/2013    Past Surgical History:  Procedure Laterality Date   APPENDECTOMY     CABG x4  06/02/2019   [place]   CARPAL TUNNEL RELEASE Bilateral    cataract surgery Bilateral 2014   EYE SURGERY Left    LAA clip Left 06/02/2019   [place]   LEFT HEART CATH AND CORONARY ANGIOGRAPHY Left 05/22/2019   Procedure: LEFT HEART CATH AND CORONARY ANGIOGRAPHY;  Surgeon: Alwyn Pea, MD;  Location: [facility] INVASIVE CV LAB;  Service: Cardiovascular;  Laterality: Left;   SHOULDER SURGERY  Left 2009    Family History  Problem Relation Age of Onset   Bone cancer Mother     Social History   Tobacco Use   Smoking status: Never   Smokeless tobacco: Never   Tobacco comments:    Never  Substance Use Topics   Alcohol use: Not Currently    Comment: socially     Current Outpatient Medications:    albuterol (VENTOLIN HFA) 108 (90 Base) MCG/ACT inhaler, Inhale 2 puffs into the lungs  every 6 (six) hours as needed for wheezing or shortness of breath (Cough)., Disp: 8 g, Rfl: 2   fluticasone furoate-vilanterol (BREO ELLIPTA) 200-25 MCG/INH AEPB, Inhale 1 puff into the lungs daily., Disp: 28 each, Rfl: 2   HUMALOG KWIKPEN 100 UNIT/ML KiwkPen, USE THREE TIMES A DAY AS DIRECTED.(UP TO 50 UNITS A DAY OR AS DIRECTED BY SLIDING SCALE) (Patient taking differently: Inject 6-10 Units into the skin 3 (three) times daily.), Disp: 15 mL, Rfl: 6   insulin glargine (LANTUS SOLOSTAR) 100 UNIT/ML Solostar Pen, Inject 40 Units into the skin daily at 10 pm., Disp: , Rfl:    metoCLOPramide (REGLAN) 10 MG tablet, Take 1 tablet (10 mg total) by mouth every 6 (six) hours as needed., Disp: 30 tablet, Rfl: 0   naproxen (NAPROSYN) 500 MG tablet, Take 1 tablet (500 mg total) by mouth 2 (two) times daily with a meal., Disp: 20 tablet, Rfl: 0   omeprazole (PRILOSEC) 40 MG capsule, Take 40 mg by mouth daily., Disp: , Rfl:    potassium chloride SA (KLOR-CON M) 20 MEQ tablet, Take 20 mEq by mouth 2 (two) times daily., Disp: , Rfl:    rosuvastatin (CRESTOR) 40 MG tablet, Take 1 tablet (40 mg total) by mouth daily. OFFICE VISIT NEEDED FOR ADDITIONAL REFILLS, Disp: 30 tablet, Rfl: 0   sildenafil (VIAGRA) 50 MG tablet, Take 50 mg by mouth daily as needed., Disp: , Rfl:    torsemide (DEMADEX) 20 MG tablet, Take 40 mg by mouth 2 (two) times daily., Disp: , Rfl:    amitriptyline (ELAVIL) 100 MG tablet, Take 1 tablet (100 mg total) by mouth at bedtime. NEEDS APPOINTMENT FOR FURTHER REFILLS, Disp: 30 tablet, Rfl: 2   Continuous Blood Gluc Sensor (FREESTYLE LIBRE 2 SENSOR) MISC, PLACE 1 TO THE BACK OF YOUR UPPER ARM, REPLACE EVERY 14 DAYS, Disp: 2 each, Rfl: 0   gabapentin (NEURONTIN) 300 MG capsule, Take 1 capsule (300 mg total) by mouth 3 (three) times daily., Disp: 90 capsule, Rfl: 2   losartan (COZAAR) 50 MG tablet, Take 1 tablet (50 mg total) by mouth daily., Disp: 90 tablet, Rfl: 1   metoprolol tartrate (LOPRESSOR)  25 MG tablet, Take 1 tablet (25 mg total) by mouth 2 (two) times daily., Disp: 180 tablet, Rfl: 1  No Known Allergies  I personally reviewed active problem list, medication list, notes from last encounter, notes from last 4 encounters, lab results with the patient/caregiver today.   Review of Systems  Constitutional:  Positive for malaise/fatigue.  Eyes:  Negative for blurred vision and double vision.  Cardiovascular:  Positive for leg swelling. Negative for chest pain and palpitations.  Gastrointestinal:  Negative for constipation, diarrhea, nausea and vomiting.  Musculoskeletal:  Negative for falls.  Skin:  Positive for rash.  Neurological:  Negative for dizziness, tingling, tremors, loss of consciousness and headaches.      Objective  Vitals:   04/24/22 0954  BP: 139/81  Pulse: 90  Temp: 97.9 F (36.6 C)  TempSrc: Oral  SpO2: 98%  Weight: 238 lb 6.4 oz (108.1 kg)  Height: 5' 7.01" (1.702 m)    Body mass index is 37.33 kg/m.  Physical Exam Vitals reviewed.  Constitutional:      General: He is awake.     Appearance: Normal appearance. He is well-developed, well-groomed and overweight.  HENT:     Head: Normocephalic and atraumatic.  Cardiovascular:     Rate and Rhythm: Normal rate and regular rhythm.     Pulses: Normal pulses.          Radial pulses are 2+ on the right side and 2+ on the left side.     Heart sounds: Normal heart sounds. No murmur heard.    No friction rub. No gallop.     Comments: Redness and skin dryness/ some skin breakdown over bilateral lower legs  Evidence of spider veins in ankles No ulcerations or weeping  Musculoskeletal:     Cervical back: Normal range of motion and neck supple.     Right lower leg: 1+ Edema present.     Left lower leg: 1+ Edema present.  Skin:    General: Skin is warm.  Neurological:     General: No focal deficit present.     Mental Status: He is alert and oriented to person, place, and time.     GCS: GCS eye  subscore is 4. GCS verbal subscore is 5. GCS motor subscore is 6.     Cranial Nerves: No dysarthria or facial asymmetry.     Gait: Gait is intact.  Psychiatric:        Attention and Perception: Attention and perception normal.        Mood and Affect: Mood and affect normal.        Speech: Speech normal.        Behavior: Behavior normal. Behavior is cooperative.      No results found for this or any previous visit (from the past 2160 hour(s)).   PHQ2/9:    04/24/2022   11:10 AM 07/24/2021   11:09 AM 03/19/2021    9:34 AM 02/05/2021   11:40 AM 01/21/2021   10:51 AM  Depression screen PHQ 2/9  Decreased Interest 0 1 0 0 0  Down, Depressed, Hopeless 0 2  0 0  PHQ - 2 Score 0 3 0 0 0  Altered sleeping 0 0     Tired, decreased energy 1 3     Change in appetite 1 0     Feeling bad or failure about yourself  0 1     Trouble concentrating 0 0     Moving slowly or fidgety/restless 0 0     Suicidal thoughts 0 0     PHQ-9 Score 2 7     Difficult doing work/chores Not difficult at all Somewhat difficult         Fall Risk:    04/24/2022   11:10 AM 07/24/2021   11:09 AM 03/19/2021    9:34 AM 02/05/2021   11:40 AM 01/21/2021   10:51 AM  Fall Risk   Falls in the past year? 0 0 0 0 0  Number falls in past yr: 0 0 0 0 0  Injury with Fall? 0 0 0 0 0  Risk for fall due to : No Fall Risks No Fall Risks No Fall Risks No Fall Risks   Follow up Falls evaluation completed Falls evaluation completed Falls evaluation completed Falls evaluation completed  Functional Status Survey:      Assessment & Plan  Problem List Items Addressed This Visit       Cardiovascular and Mediastinum   Hypertension - Primary    Chronic, historic condition He is taking Losartan 50 mg PO QD and Metoprolol 25 mg PO BID and appears to be tolerating well We discussed the importance of exercise and diet as well as getting DM under better control to assist with HTN and CVD health Refills provided  today Recommend follow up in 3 months for monitoring       Relevant Medications   losartan (COZAAR) 50 MG tablet   metoprolol tartrate (LOPRESSOR) 25 MG tablet   Diabetes mellitus type 1 with peripheral artery disease (HCC)    Chronic, historic condition He is followed by Endocrinology and Cardiology Concern today for redness and skin dryness of lower legs We discussed various causes of this as well as PVD  He has upcoming follow up with Cardiology - will continue to monitor He may need to be referred to vascular specialty for eval  Follow up in 3 months recommended for monitoring       Relevant Medications   losartan (COZAAR) 50 MG tablet   metoprolol tartrate (LOPRESSOR) 25 MG tablet     Endocrine   DM type 1 with diabetic peripheral neuropathy (HCC)    Chronic, ongoing condition Reviewed Endocrine notes from previous visit- A1c is still elevated over goal He reports he is trying to follow insulin administration recommendations more thoroughly and has noticed an improvement in Blood glucose while doing this  Will defer to Endocrine for management but reinforced importance of following their directions Continue follow up with Endocrine for now      Relevant Medications   amitriptyline (ELAVIL) 100 MG tablet   gabapentin (NEURONTIN) 300 MG capsule   losartan (COZAAR) 50 MG tablet     Other   Insomnia    Chronic, appears overall stable on current regimen He is taking Amitriptyline 100 mg PO QHS and appears to be tolerating well Continue current regimen  Follow up as needed for progressing symptoms       Relevant Medications   amitriptyline (ELAVIL) 100 MG tablet     Return in about 3 months (around 07/24/2022) for HTN, Diabetes follow up.   I, Neal Trulson E Taiki Buckwalter, PA-C, have reviewed all documentation for this visit. The documentation on 04/24/22 for the exam, diagnosis, procedures, and orders are all accurate and complete.   Jacquelin Hawking, MHS, PA-C [facility]

## 2022-04-24 NOTE — Assessment & Plan Note (Signed)
Chronic, appears overall stable on current regimen He is taking Amitriptyline 100 mg PO QHS and appears to be tolerating well Continue current regimen  Follow up as needed for progressing symptoms

## 2022-04-24 NOTE — Assessment & Plan Note (Signed)
Chronic, ongoing condition Reviewed Endocrine notes from previous visit- A1c is still elevated over goal He reports he is trying to follow insulin administration recommendations more thoroughly and has noticed an improvement in Blood glucose while doing this  Will defer to Endocrine for management but reinforced importance of following their directions Continue follow up with Endocrine for now

## 2022-04-24 NOTE — Patient Instructions (Addendum)
I recommend using Aquaphor or Vaseline for your legs and dry skin Taking lukewarm showers, and using a gentle cleanser can help reduce irritation  You need to get your Shingles shot, Tetanus (Tdap) and Flu shot   When you go to the eye doctor please tell them you need a diabetic eye exam and have the results sent to this office so we can check for diabetic retinopathy

## 2022-04-24 NOTE — Assessment & Plan Note (Signed)
Chronic, historic condition He is followed by Endocrinology and Cardiology Concern today for redness and skin dryness of lower legs We discussed various causes of this as well as PVD  He has upcoming follow up with Cardiology - will continue to monitor He may need to be referred to vascular specialty for eval  Follow up in 3 months recommended for monitoring

## 2022-04-24 NOTE — Assessment & Plan Note (Signed)
Chronic, historic condition He is taking Losartan 50 mg PO QD and Metoprolol 25 mg PO BID and appears to be tolerating well We discussed the importance of exercise and diet as well as getting DM under better control to assist with HTN and CVD health Refills provided today Recommend follow up in 3 months for monitoring

## 2022-05-01 ENCOUNTER — Other Ambulatory Visit: Payer: Self-pay | Admitting: Nurse Practitioner

## 2022-05-01 MED ORDER — ROSUVASTATIN CALCIUM 40 MG PO TABS: 40.0000 mg | ORAL_TABLET | Freq: Every day | ORAL | 0 refills | Status: DC

## 2022-05-01 NOTE — Telephone Encounter (Signed)
Requested Prescriptions  Pending Prescriptions Disp Refills  . rosuvastatin (CRESTOR) 40 MG tablet [Pharmacy Med Name: ROSUVASTATIN 40 MG TAB[*]] 30 tablet 0    Sig: Take 1 tablet (40 mg total) by mouth daily.     Cardiovascular:  Antilipid - Statins 2 Failed - 05/01/2022  3:24 PM      Failed - Lipid Panel in normal range within the last 12 months    Cholesterol, Total  Date Value Ref Range Status  07/17/2021 120 100 - 199 mg/dL Final   Cholesterol Piccolo, Waived  Date Value Ref Range Status  12/15/2016 103 <200 mg/dL Final    Comment:                            Desirable                <200                         Borderline High      200- 239                         High                     >239    LDL Chol Calc (NIH)  Date Value Ref Range Status  07/17/2021 54 0 - 99 mg/dL Final   HDL  Date Value Ref Range Status  07/17/2021 39 (L) >39 mg/dL Final   Triglycerides  Date Value Ref Range Status  07/17/2021 162 (H) 0 - 149 mg/dL Final   Triglycerides Piccolo,Waived  Date Value Ref Range Status  12/15/2016 85 <150 mg/dL Final    Comment:                            Normal                   <150                         Borderline High     150 - 199                         High                200 - 499                         Very High                >499          Passed - Cr in normal range and within 360 days    Creatinine, Ser  Date Value Ref Range Status  11/15/2021 1.16 0.61 - 1.24 mg/dL Final         Passed - Patient is not pregnant      Passed - Valid encounter within last 12 months    Recent Outpatient Visits          1 week ago Primary hypertension   [facility] Mecum, Erin E, PA-C   7 months ago Diabetes mellitus without complication ([place])   [place], [place]   9 months ago Lower extremity edema   [employer], Customer service manager,  MD   9 months ago Lower extremity edema   [facility] Vigg,  Avanti, MD   1 year ago Need for influenza vaccination   [facility] Vigg, Avanti, MD      Future Appointments            In 2 months Mecum, Oswaldo Conroy, PA-C [employer], [facility]

## 2022-05-26 DIAGNOSIS — Z794 Long term (current) use of insulin: Secondary | ICD-10-CM | POA: Diagnosis not present

## 2022-05-26 DIAGNOSIS — E109 Type 1 diabetes mellitus without complications: Secondary | ICD-10-CM | POA: Diagnosis not present

## 2022-06-01 DIAGNOSIS — E1065 Type 1 diabetes mellitus with hyperglycemia: Secondary | ICD-10-CM | POA: Diagnosis not present

## 2022-06-02 DIAGNOSIS — I10 Essential (primary) hypertension: Secondary | ICD-10-CM | POA: Diagnosis not present

## 2022-06-02 DIAGNOSIS — Z951 Presence of aortocoronary bypass graft: Secondary | ICD-10-CM | POA: Diagnosis not present

## 2022-06-02 DIAGNOSIS — E782 Mixed hyperlipidemia: Secondary | ICD-10-CM | POA: Diagnosis not present

## 2022-06-02 DIAGNOSIS — Z87891 Personal history of nicotine dependence: Secondary | ICD-10-CM | POA: Diagnosis not present

## 2022-06-02 DIAGNOSIS — R0602 Shortness of breath: Secondary | ICD-10-CM | POA: Diagnosis not present

## 2022-06-02 DIAGNOSIS — G4733 Obstructive sleep apnea (adult) (pediatric): Secondary | ICD-10-CM | POA: Diagnosis not present

## 2022-06-02 DIAGNOSIS — R600 Localized edema: Secondary | ICD-10-CM | POA: Diagnosis not present

## 2022-06-02 DIAGNOSIS — E108 Type 1 diabetes mellitus with unspecified complications: Secondary | ICD-10-CM | POA: Diagnosis not present

## 2022-06-02 DIAGNOSIS — I2510 Atherosclerotic heart disease of native coronary artery without angina pectoris: Secondary | ICD-10-CM | POA: Diagnosis not present

## 2022-06-08 ENCOUNTER — Encounter (INDEPENDENT_AMBULATORY_CARE_PROVIDER_SITE_OTHER): Payer: Self-pay

## 2022-07-09 DIAGNOSIS — I10 Essential (primary) hypertension: Secondary | ICD-10-CM | POA: Diagnosis not present

## 2022-07-09 DIAGNOSIS — E1065 Type 1 diabetes mellitus with hyperglycemia: Secondary | ICD-10-CM | POA: Diagnosis not present

## 2022-07-09 DIAGNOSIS — R809 Proteinuria, unspecified: Secondary | ICD-10-CM | POA: Diagnosis not present

## 2022-07-09 DIAGNOSIS — E1029 Type 1 diabetes mellitus with other diabetic kidney complication: Secondary | ICD-10-CM | POA: Diagnosis not present

## 2022-07-09 DIAGNOSIS — E103553 Type 1 diabetes mellitus with stable proliferative diabetic retinopathy, bilateral: Secondary | ICD-10-CM | POA: Diagnosis not present

## 2022-07-09 DIAGNOSIS — E1059 Type 1 diabetes mellitus with other circulatory complications: Secondary | ICD-10-CM | POA: Diagnosis not present

## 2022-07-09 DIAGNOSIS — Z794 Long term (current) use of insulin: Secondary | ICD-10-CM | POA: Diagnosis not present

## 2022-07-09 DIAGNOSIS — E1042 Type 1 diabetes mellitus with diabetic polyneuropathy: Secondary | ICD-10-CM | POA: Diagnosis not present

## 2022-07-13 ENCOUNTER — Other Ambulatory Visit: Payer: Self-pay

## 2022-07-13 DIAGNOSIS — E1042 Type 1 diabetes mellitus with diabetic polyneuropathy: Secondary | ICD-10-CM

## 2022-07-13 MED ORDER — GABAPENTIN 300 MG PO CAPS: 300.0000 mg | ORAL_CAPSULE | Freq: Three times a day (TID) | ORAL | 2 refills | Status: DC

## 2022-07-13 NOTE — Telephone Encounter (Signed)
Medication refill for Gabapentin 300mg  last ov 04/24/22, upcoming ov 07/24/22 . Please advise

## 2022-07-20 ENCOUNTER — Other Ambulatory Visit: Payer: Self-pay | Admitting: Nurse Practitioner

## 2022-07-20 DIAGNOSIS — G4709 Other insomnia: Secondary | ICD-10-CM

## 2022-07-20 MED ORDER — AMITRIPTYLINE HCL 100 MG PO TABS: 100.0000 mg | ORAL_TABLET | Freq: Every day | ORAL | 2 refills | Status: DC

## 2022-07-24 ENCOUNTER — Encounter: Payer: Self-pay | Admitting: Physician Assistant

## 2022-07-24 ENCOUNTER — Ambulatory Visit (INDEPENDENT_AMBULATORY_CARE_PROVIDER_SITE_OTHER): Payer: Medicare Other | Admitting: Physician Assistant

## 2022-07-24 VITALS — BP 131/67 | HR 85 | Temp 98.1°F | Wt 239.8 lb

## 2022-07-24 DIAGNOSIS — E1042 Type 1 diabetes mellitus with diabetic polyneuropathy: Secondary | ICD-10-CM | POA: Diagnosis not present

## 2022-07-24 DIAGNOSIS — E118 Type 2 diabetes mellitus with unspecified complications: Secondary | ICD-10-CM | POA: Diagnosis not present

## 2022-07-24 DIAGNOSIS — E1051 Type 1 diabetes mellitus with diabetic peripheral angiopathy without gangrene: Secondary | ICD-10-CM

## 2022-07-24 DIAGNOSIS — I10 Essential (primary) hypertension: Secondary | ICD-10-CM

## 2022-07-24 DIAGNOSIS — E7800 Pure hypercholesterolemia, unspecified: Secondary | ICD-10-CM

## 2022-07-24 DIAGNOSIS — R0989 Other specified symptoms and signs involving the circulatory and respiratory systems: Secondary | ICD-10-CM | POA: Diagnosis not present

## 2022-07-24 LAB — MICROALBUMIN, URINE WAIVED
Creatinine, Urine Waived: 50 mg/dL (ref 10–300)
Microalb, Ur Waived: 10 mg/L (ref 0–19)
Microalb/Creat Ratio: <30 mg/g (ref <30)

## 2022-07-24 NOTE — Assessment & Plan Note (Signed)
See PAD assessment and plan above

## 2022-07-24 NOTE — Progress Notes (Unsigned)
Established Patient Office Visit  Name: Mike Kline   MRN: [id number]    DOB: Sep 20, 1970   Date:07/27/2022  Today's Provider: Talitha Givens, MHS, PA-C Introduced myself to the patient as a PA-C and provided education on APPs in clinical practice.         Subjective  Chief Complaint  Chief Complaint  Patient presents with   Diabetes   Hyperlipidemia   Hypertension    Diabetes Pertinent negatives for hypoglycemia include no dizziness, headaches or tremors. Pertinent negatives for diabetes include no chest pain.  Hyperlipidemia Pertinent negatives include no chest pain or shortness of breath.  Hypertension Associated symptoms include malaise/fatigue. Pertinent negatives include no chest pain, headaches, palpitations or shortness of breath.    Diabetes, Type 1  Feels like he is doing better with administering shots when he is supposed to and thinks A1c is improving   - Last A1c 9.8 - measured with Endo provider on 07/09/22  - Medications: Humalog Kwikpen and Glargine  - Compliance: poor  - Checking BG at home: Yes uses CGM  - Eye exam: Discussed importance of regular eye exams for monitoring for retinopathy  - Foot exam: Completed today  - Microalbumin: Appears to have been completed at Endo office  - Statin: taking Crestor  - PNA vaccine: Has not completed  - Denies symptoms of hypoglycemia, polyuria, polydipsia, numbness extremities, foot ulcers/trauma  HYPERTENSION / [place] Satisfied with current treatment? yes Duration of hypertension: chronic BP monitoring frequency: a few times a month BP range: he is unsure of results -states someone else checks it for him BP medication side effects: no Past BP meds: losartan (cozaar) Duration of hyperlipidemia: years Cholesterol medication side effects: no Cholesterol supplements: none Past cholesterol medications: rosuvastatin (crestor) Medication compliance: good compliance Aspirin: no Recent stressors:  no Recurrent headaches: no Visual changes: no Palpitations: no Dyspnea: no Chest pain: no Lower extremity edema: no Dizzy/lightheaded: no       Patient Active Problem List   Diagnosis Date Noted   Diminished pulses in lower extremity 07/24/2022   Diabetic foot ([place]) 07/24/2022   Other hyperlipidemia 09/09/2021   Lower extremity edema 07/21/2021   DM type 1 with diabetic peripheral neuropathy ([place]) 07/12/2021   Need for influenza vaccination 04/24/2021   Heel pain 03/19/2021   Moderate asthma without complication 45/85/9292   Chronic cough 02/12/2021   Aortic atherosclerosis ([place]) 11/08/2020   Blindness of left eye 07/02/2020   Depression 07/02/2020   Respiratory failure ([place]) 06/13/2020   Upper respiratory tract infection 06/03/2020   ASCVD (arteriosclerotic cardiovascular disease) 09/13/2019   Acute blood loss anemia 06/07/2019   Acute postoperative pain 06/07/2019   AKI (acute kidney injury) ([place]) 06/07/2019   Hyponatremia 06/07/2019   Ileus ([place]) 06/07/2019   Physical deconditioning 06/07/2019   PONV (postoperative nausea and vomiting) 06/07/2019   S/P CABG (coronary artery bypass graft) 06/03/2019   Coronary atherosclerosis due to lipid rich plaque 05/23/2019   Anxiety 04/14/2019   Insomnia 05/28/2016   Adhesive capsulitis 12/17/2015   Hypercholesterolemia 10/22/2015   Diabetic, retinopathy, proliferative ([place]) 05/28/2015   Diabetes mellitus type 1 with peripheral artery disease ([place]) 05/28/2015   Hypertension    Type I diabetes mellitus with manifestations, uncontrolled ([place])    GERD (gastroesophageal reflux disease)    Nuclear sclerotic cataract 07/05/2013   Type 1 diabetes mellitus ([place]) 05/29/2013    Past Surgical History:  Procedure Laterality Date   APPENDECTOMY  CABG x4  06/02/2019      CARPAL TUNNEL RELEASE Bilateral    cataract surgery Bilateral 2014   EYE SURGERY Left    LAA clip Left 06/02/2019   [name]   LEFT HEART CATH  AND CORONARY ANGIOGRAPHY Left 05/22/2019   Procedure: LEFT HEART CATH AND CORONARY ANGIOGRAPHY;  Surgeon: Yolonda Kida, MD;  Location: [facility];  Service: Cardiovascular;  Laterality: Left;   SHOULDER SURGERY Left 2009    Family History  Problem Relation Age of Onset   Bone cancer Mother     Social History   Tobacco Use   Smoking status: Never   Smokeless tobacco: Never   Tobacco comments:    Never  Substance Use Topics   Alcohol use: Not Currently    Comment: socially     Current Outpatient Medications:    albuterol (VENTOLIN HFA) 108 (90 Base) MCG/ACT inhaler, Inhale 2 puffs into the lungs every 6 (six) hours as needed for wheezing or shortness of breath (Cough)., Disp: 8 g, Rfl: 2   amitriptyline (ELAVIL) 100 MG tablet, Take 1 tablet (100 mg total) by mouth at bedtime. NEEDS APPOINTMENT FOR FURTHER REFILLS, Disp: 30 tablet, Rfl: 2   Continuous Blood Gluc Sensor (FREESTYLE LIBRE 2 SENSOR) MISC, PLACE 1 TO THE BACK OF YOUR UPPER ARM, REPLACE EVERY 14 DAYS, Disp: 2 each, Rfl: 0   gabapentin (NEURONTIN) 300 MG capsule, Take 1 capsule (300 mg total) by mouth 3 (three) times daily., Disp: 90 capsule, Rfl: 2   HUMALOG KWIKPEN 100 UNIT/ML KiwkPen, USE THREE TIMES A DAY AS DIRECTED.(UP TO 50 UNITS A DAY OR AS DIRECTED BY SLIDING SCALE) (Patient taking differently: Inject 6-10 Units into the skin 3 (three) times daily.), Disp: 15 mL, Rfl: 6   insulin glargine (LANTUS SOLOSTAR) 100 UNIT/ML Solostar Pen, Inject 40 Units into the skin daily at 10 pm., Disp: , Rfl:    losartan (COZAAR) 50 MG tablet, Take 1 tablet (50 mg total) by mouth daily., Disp: 90 tablet, Rfl: 1   omeprazole (PRILOSEC) 40 MG capsule, Take 40 mg by mouth daily., Disp: , Rfl:    potassium chloride SA (KLOR-CON M) 20 MEQ tablet, Take 20 mEq by mouth 2 (two) times daily., Disp: , Rfl:    rosuvastatin (CRESTOR) 40 MG tablet, TAKE ONE TABLET BY MOUTH ONE TIME DAILY **MUST CALL DR. FOR APPOINTMENT FOR FURTHER  REFILLS**, Disp: 90 tablet, Rfl: 0   sildenafil (VIAGRA) 50 MG tablet, Take 50 mg by mouth daily as needed., Disp: , Rfl:    torsemide (DEMADEX) 20 MG tablet, Take 40 mg by mouth 2 (two) times daily., Disp: , Rfl:   No Known Allergies  I personally reviewed active problem list, medication list, allergies, health maintenance, notes from last encounter, lab results, imaging with the patient/caregiver today.   Review of Systems  Constitutional:  Positive for malaise/fatigue. Negative for chills and fever.  Respiratory:  Negative for shortness of breath and wheezing.   Cardiovascular:  Negative for chest pain, palpitations and leg swelling.  Musculoskeletal:  Negative for falls.  Neurological:  Negative for dizziness, tingling, tremors, loss of consciousness and headaches.      Objective  Vitals:   07/24/22 1013  BP: 131/67  Pulse: 85  Temp: 98.1 F (36.7 C)  TempSrc: Oral  SpO2: 97%  Weight: 239 lb 12.8 oz (108.8 kg)    Body mass index is 37.55 kg/m.  Physical Exam Vitals reviewed.  Constitutional:  General: He is awake.     Appearance: Normal appearance. He is well-developed and well-groomed.  HENT:     Head: Normocephalic and atraumatic.  Eyes:     General: Lids are normal. Gaze aligned appropriately.     Extraocular Movements: Extraocular movements intact.     Conjunctiva/sclera: Conjunctivae normal.     Pupils: Pupils are equal, round, and reactive to light.  Cardiovascular:     Rate and Rhythm: Normal rate and regular rhythm.     Pulses: Normal pulses.     Heart sounds: Normal heart sounds. No murmur heard.    No friction rub. No gallop.  Pulmonary:     Effort: Pulmonary effort is normal.     Breath sounds: Normal breath sounds. No decreased air movement. No decreased breath sounds, wheezing, rhonchi or rales.  Musculoskeletal:     Right lower leg: No edema.     Left lower leg: No edema.  Neurological:     Mental Status: He is alert.  Psychiatric:         Attention and Perception: Attention and perception normal.        Mood and Affect: Mood and affect normal.        Speech: Speech normal.        Behavior: Behavior normal. Behavior is cooperative.      Recent Results (from the past 2160 hour(s))  Microalbumin, Urine Waived (STAT)     Status: None   Collection Time: 07/24/22 10:54 AM  Result Value Ref Range   Microalb, Ur Waived 10 0 - 19 mg/L   Creatinine, Urine Waived 50 10 - 300 mg/dL   Microalb/Creat Ratio <30 <30 mg/g    Comment:                              Abnormal:       30 - 300                         High Abnormal:           >300   Lipid Profile     Status: Abnormal   Collection Time: 07/24/22 10:56 AM  Result Value Ref Range   Cholesterol, Total 136 100 - 199 mg/dL   Triglycerides 240 (H) 0 - 149 mg/dL   HDL 36 (L) >39 mg/dL   VLDL Cholesterol Cal 39 5 - 40 mg/dL   LDL Chol Calc (NIH) 61 0 - 99 mg/dL   Chol/HDL Ratio 3.8 0.0 - 5.0 ratio    Comment:                                   T. Chol/HDL Ratio                                             Men  Women                               1/2 Avg.Risk  3.4    3.3  Avg.Risk  5.0    4.4                                2X Avg.Risk  9.6    7.1                                3X Avg.Risk 23.4   11.0   Comp Met (CMET)     Status: Abnormal   Collection Time: 07/24/22 10:56 AM  Result Value Ref Range   Glucose 82 70 - 99 mg/dL   BUN 17 6 - 24 mg/dL   Creatinine, Ser 1.40 (H) 0.76 - 1.27 mg/dL   eGFR 60 >59 mL/min/1.73   BUN/Creatinine Ratio 12 9 - 20   Sodium 139 134 - 144 mmol/L   Potassium 4.2 3.5 - 5.2 mmol/L   Chloride 99 96 - 106 mmol/L   CO2 24 20 - 29 mmol/L   Calcium 9.5 8.7 - 10.2 mg/dL   Total Protein 7.0 6.0 - 8.5 g/dL   Albumin 4.3 3.8 - 4.9 g/dL   Globulin, Total 2.7 1.5 - 4.5 g/dL   Albumin/Globulin Ratio 1.6 1.2 - 2.2   Bilirubin Total 0.3 0.0 - 1.2 mg/dL   Alkaline Phosphatase 141 (H) 44 - 121 IU/L   AST 25 0 - 40  IU/L   ALT 22 0 - 44 IU/L  CBC w/Diff     Status: Abnormal   Collection Time: 07/24/22 10:56 AM  Result Value Ref Range   WBC 6.0 3.4 - 10.8 x10E3/uL   RBC 5.03 4.14 - 5.80 x10E6/uL   Hemoglobin 13.0 13.0 - 17.7 g/dL   Hematocrit 39.9 37.5 - 51.0 %   MCV 79 79 - 97 fL   MCH 25.8 (L) 26.6 - 33.0 pg   MCHC 32.6 31.5 - 35.7 g/dL   RDW 14.3 11.6 - 15.4 %   Platelets 314 150 - 450 x10E3/uL   Neutrophils 57 Not Estab. %   Lymphs 29 Not Estab. %   Monocytes 8 Not Estab. %   Eos 4 Not Estab. %   Basos 1 Not Estab. %   Neutrophils Absolute 3.5 1.4 - 7.0 x10E3/uL   Lymphocytes Absolute 1.7 0.7 - 3.1 x10E3/uL   Monocytes Absolute 0.5 0.1 - 0.9 x10E3/uL   EOS (ABSOLUTE) 0.3 0.0 - 0.4 x10E3/uL   Basophils Absolute 0.0 0.0 - 0.2 x10E3/uL   Immature Granulocytes 1 Not Estab. %   Immature Grans (Abs) 0.0 0.0 - 0.1 x10E3/uL     PHQ2/9:    07/24/2022   10:20 AM 04/24/2022   11:10 AM 07/24/2021   11:09 AM 03/19/2021    9:34 AM 02/05/2021   11:40 AM  Depression screen PHQ 2/9  Decreased Interest 2 0 1 0 0  Down, Depressed, Hopeless 0 0 2  0  PHQ - 2 Score 2 0 3 0 0  Altered sleeping 2 0 0    Tired, decreased energy _0 Change in appetite 0 1 0    Feeling bad or failure about yourself  0 0 1    Trouble concentrating 0 0 0    Moving slowly or fidgety/restless 0 0 0    Suicidal thoughts 0 0 0    PHQ-9 Score _1 Difficult doing work/chores Not difficult at all Not difficult at all Somewhat  difficult        Fall Risk:    07/24/2022   10:19 AM 04/24/2022   11:10 AM 07/24/2021   11:09 AM 03/19/2021    9:34 AM 02/05/2021   11:40 AM  [place] in the past year? 0 0 0 0 0  Number falls in past yr: 0 0 0 0 0  Injury with Fall? 0 0 0 0 0  Risk for fall due to : _0   Follow up Falls evaluation completed Falls evaluation completed Falls evaluation completed Falls evaluation completed Falls evaluation  completed      Functional Status Survey:      Assessment & Plan  Problem List Items Addressed This Visit       Cardiovascular and Mediastinum   Hypertension    Chronic, historic condition Currently appears well managed with regimen comprised of Losartan 50 mg PO QD BP is overall in goal today - he does not know what his readings are at home Reviewed the importance of diet and exercise for managing BP and maintaining cardiovascular health Follow up in 3 months for monitoring        Relevant Orders   Comp Met (CMET) (Completed)   CBC w/Diff (Completed)   Diabetes mellitus type 1 with peripheral artery disease (HCC)    Chronic, historic condition Continued concern today for severely dry skin and reduced pulses bilaterally  DM is still poorly controlled and he is followed by Endo  Concern for PVD at this time Will place referral to Vascular for evaluation and assistance with management Follow up in 3 months for monitoring and continued coordination of care         Endocrine   DM type 1 with diabetic peripheral neuropathy ([place]) - Primary    Chronic, ongoing, not well controlled per review of Endocrinology notes Last A1c was 9.8 on 07/09/22  He reports he is trying to improve his insulin administration - reports lingering fear with prandial insulin administration  Insulin doses are currently managed by Endocrinology provider- will defer to their recommendations and try to optimize HLD, HTN and coordinate care with Vascular and Podiatry Follow up in 3 months to discuss progress with Endo and for monitoring      Relevant Orders   Comp Met (CMET) (Completed)   Ambulatory referral to Podiatry   Microalbumin, Urine Waived (STAT) (Completed)   Diabetic foot ([place])    Patient has severely dry skin and thick callous formation on both feet along with suspected PVD Recommend he have evaluation and medical pedicure with Podiatry to prevent future complications given poorly  controlled T1DM and reduced mobility  Follow up every 3-6 months for monitoring Referral placed to Podiatry today to assist with management       Relevant Orders   Ambulatory referral to Podiatry     Other   Hypercholesterolemia    Chronic, ongoing concern He reports good compliance with Rosuvastatin 40 mg PO QD and appears to be tolerating well Reports interest in getting back to exercising regularly  Will recheck cholesterol panel today -results to dictate further management Continue current regimen- pending results Follow up in 6 months for monitoring       Relevant Orders   Lipid Profile (Completed)   Diminished pulses in lower extremity    See PAD assessment and plan above       Relevant Orders   Ambulatory referral  to Vascular Surgery     Return in about 3 months (around 10/23/2022) for HTN, Diabetes follow up.   I, Mahad Newstrom E Jaton Eilers, PA-C, have reviewed all documentation for this visit. The documentation on 07/27/22 for the exam, diagnosis, procedures, and orders are all accurate and complete.   Talitha Givens, MHS, PA-C [facility]

## 2022-07-24 NOTE — Assessment & Plan Note (Addendum)
Chronic, ongoing concern He reports good compliance with Rosuvastatin 40 mg PO QD and appears to be tolerating well Reports interest in getting back to exercising regularly  Will recheck cholesterol panel today -results to dictate further management Continue current regimen- pending results Follow up in 6 months for monitoring

## 2022-07-24 NOTE — Assessment & Plan Note (Signed)
Patient has severely dry skin and thick callous formation on both feet along with suspected PVD Recommend he have evaluation and medical pedicure with Podiatry to prevent future complications given poorly controlled T1DM and reduced mobility  Follow up every 3-6 months for monitoring Referral placed to Podiatry today to assist with management

## 2022-07-25 LAB — CBC WITH DIFFERENTIAL/PLATELET
Basophils Absolute: 0 10*3/uL (ref 0.0–0.2)
Basos: 1 %
EOS (ABSOLUTE): 0.3 10*3/uL (ref 0.0–0.4)
Eos: 4 %
Hematocrit: 39.9 % (ref 37.5–51.0)
Hemoglobin: 13 g/dL (ref 13.0–17.7)
Immature Grans (Abs): 0 10*3/uL (ref 0.0–0.1)
Immature Granulocytes: 1 %
Lymphocytes Absolute: 1.7 10*3/uL (ref 0.7–3.1)
Lymphs: 29 %
MCH: 25.8 pg — ABNORMAL LOW (ref 26.6–33.0)
MCHC: 32.6 g/dL (ref 31.5–35.7)
MCV: 79 fL (ref 79–97)
Monocytes Absolute: 0.5 10*3/uL (ref 0.1–0.9)
Monocytes: 8 %
Neutrophils Absolute: 3.5 10*3/uL (ref 1.4–7.0)
Neutrophils: 57 %
Platelets: 314 10*3/uL (ref 150–450)
RBC: 5.03 x10E6/uL (ref 4.14–5.80)
RDW: 14.3 % (ref 11.6–15.4)
WBC: 6 10*3/uL (ref 3.4–10.8)

## 2022-07-25 LAB — COMPREHENSIVE METABOLIC PANEL
ALT: 22 IU/L (ref 0–44)
AST: 25 IU/L (ref 0–40)
Albumin/Globulin Ratio: 1.6 (ref 1.2–2.2)
Albumin: 4.3 g/dL (ref 3.8–4.9)
Alkaline Phosphatase: 141 IU/L — ABNORMAL HIGH (ref 44–121)
BUN/Creatinine Ratio: 12 (ref 9–20)
BUN: 17 mg/dL (ref 6–24)
Bilirubin Total: 0.3 mg/dL (ref 0.0–1.2)
CO2: 24 mmol/L (ref 20–29)
Calcium: 9.5 mg/dL (ref 8.7–10.2)
Chloride: 99 mmol/L (ref 96–106)
Creatinine, Ser: 1.4 mg/dL — ABNORMAL HIGH (ref 0.76–1.27)
Globulin, Total: 2.7 g/dL (ref 1.5–4.5)
Glucose: 82 mg/dL (ref 70–99)
Potassium: 4.2 mmol/L (ref 3.5–5.2)
Sodium: 139 mmol/L (ref 134–144)
Total Protein: 7 g/dL (ref 6.0–8.5)
eGFR: 60 mL/min/{1.73_m2} (ref >59)

## 2022-07-25 LAB — LIPID PANEL
Chol/HDL Ratio: 3.8 ratio (ref 0.0–5.0)
Cholesterol, Total: 136 mg/dL (ref 100–199)
HDL: 36 mg/dL — ABNORMAL LOW (ref >39)
LDL Chol Calc (NIH): 61 mg/dL (ref 0–99)
Triglycerides: 240 mg/dL — ABNORMAL HIGH (ref 0–149)
VLDL Cholesterol Cal: 39 mg/dL (ref 5–40)

## 2022-07-27 NOTE — Assessment & Plan Note (Signed)
Chronic, ongoing, not well controlled per review of Endocrinology notes Last A1c was 9.8 on 07/09/22  He reports he is trying to improve his insulin administration - reports lingering fear with prandial insulin administration  Insulin doses are currently managed by Endocrinology provider- will defer to their recommendations and try to optimize HLD, HTN and coordinate care with Vascular and Podiatry Follow up in 3 months to discuss progress with Endo and for monitoring

## 2022-07-27 NOTE — Assessment & Plan Note (Signed)
Chronic, historic condition Continued concern today for severely dry skin and reduced pulses bilaterally  DM is still poorly controlled and he is followed by Endo  Concern for PVD at this time Will place referral to Vascular for evaluation and assistance with management Follow up in 3 months for monitoring and continued coordination of care

## 2022-07-27 NOTE — Assessment & Plan Note (Signed)
Chronic, historic condition Currently appears well managed with regimen comprised of Losartan 50 mg PO QD BP is overall in goal today - he does not know what his readings are at home Reviewed the importance of diet and exercise for managing BP and maintaining cardiovascular health Follow up in 3 months for monitoring

## 2022-08-14 DIAGNOSIS — Z794 Long term (current) use of insulin: Secondary | ICD-10-CM | POA: Diagnosis not present

## 2022-08-14 DIAGNOSIS — E109 Type 1 diabetes mellitus without complications: Secondary | ICD-10-CM | POA: Diagnosis not present

## 2022-10-13 DIAGNOSIS — E1059 Type 1 diabetes mellitus with other circulatory complications: Secondary | ICD-10-CM | POA: Diagnosis not present

## 2022-10-13 DIAGNOSIS — E1065 Type 1 diabetes mellitus with hyperglycemia: Secondary | ICD-10-CM | POA: Diagnosis not present

## 2022-10-20 DIAGNOSIS — E1059 Type 1 diabetes mellitus with other circulatory complications: Secondary | ICD-10-CM | POA: Diagnosis not present

## 2022-10-20 DIAGNOSIS — E1029 Type 1 diabetes mellitus with other diabetic kidney complication: Secondary | ICD-10-CM | POA: Diagnosis not present

## 2022-10-20 DIAGNOSIS — E103553 Type 1 diabetes mellitus with stable proliferative diabetic retinopathy, bilateral: Secondary | ICD-10-CM | POA: Diagnosis not present

## 2022-10-20 DIAGNOSIS — E1065 Type 1 diabetes mellitus with hyperglycemia: Secondary | ICD-10-CM | POA: Diagnosis not present

## 2022-10-20 DIAGNOSIS — R809 Proteinuria, unspecified: Secondary | ICD-10-CM | POA: Diagnosis not present

## 2022-10-20 DIAGNOSIS — E1069 Type 1 diabetes mellitus with other specified complication: Secondary | ICD-10-CM | POA: Diagnosis not present

## 2022-10-20 DIAGNOSIS — E1022 Type 1 diabetes mellitus with diabetic chronic kidney disease: Secondary | ICD-10-CM | POA: Diagnosis not present

## 2022-10-20 DIAGNOSIS — E1042 Type 1 diabetes mellitus with diabetic polyneuropathy: Secondary | ICD-10-CM | POA: Diagnosis not present

## 2022-10-20 DIAGNOSIS — N1831 Chronic kidney disease, stage 3a: Secondary | ICD-10-CM | POA: Diagnosis not present

## 2022-10-20 DIAGNOSIS — I10 Essential (primary) hypertension: Secondary | ICD-10-CM | POA: Diagnosis not present

## 2022-10-21 ENCOUNTER — Encounter: Payer: Self-pay | Admitting: Nurse Practitioner

## 2022-10-21 ENCOUNTER — Other Ambulatory Visit: Payer: Self-pay | Admitting: Nurse Practitioner

## 2022-10-21 DIAGNOSIS — E1042 Type 1 diabetes mellitus with diabetic polyneuropathy: Secondary | ICD-10-CM

## 2022-10-21 DIAGNOSIS — G4709 Other insomnia: Secondary | ICD-10-CM

## 2022-10-21 MED ORDER — AMITRIPTYLINE HCL 100 MG PO TABS: 100.0000 mg | ORAL_TABLET | Freq: Every day | ORAL | 2 refills | Status: DC

## 2022-10-21 MED ORDER — GABAPENTIN 300 MG PO CAPS: 300.0000 mg | ORAL_CAPSULE | Freq: Three times a day (TID) | ORAL | 2 refills | Status: DC

## 2022-10-21 NOTE — Telephone Encounter (Signed)
Duplicate request- already ordered today\ Requested Prescriptions  Pending Prescriptions Disp Refills   amitriptyline (ELAVIL) 100 MG tablet [Pharmacy Med Name: AMITRIPTYLINE 100 MG TAB] 30 tablet 2    Sig: TAKE ONE TABLET BY MOUTH AT BEDTIME *NEEDS APPOINTMENT FOR FURTHER REFILLS*     Psychiatry:  Antidepressants - Heterocyclics (TCAs) Passed - 10/21/2022  7:28 AM      Passed - Completed PHQ-2 or PHQ-9 in the last 360 days      Passed - Valid encounter within last 6 months    Recent Outpatient Visits           2 months ago DM type 1 with diabetic peripheral neuropathy ([place])   [place], Dani Gobble, PA-C   6 months ago Primary hypertension   [place] M[place]um, Erin E, PA-C   1 year ago Diabetes mellitus without complication ([place])   [place] Vigg, Avanti, MD   1 year ago Lower extremity edema   [place] Vigg, Avanti, MD   1 year ago Lower extremity edema   [place] Vigg, Avanti, MD       Future Appointments             In 1 week [place], Henrine Screws T, NP [place], [facility]             gabapentin (NEURONTIN) 300 MG capsule [Pharmacy Med Name: GABAPENTIN 300 MG CAP[*]] 90 capsule 2    Sig: TAKE ONE CAPSULE BY [place] A DAY     Neurology: Anticonvulsants - gabapentin Failed - 10/21/2022  7:28 AM      Failed - Cr in normal range and within 360 days    Creatinine, Ser  Date Value Ref Range Status  07/24/2022 1.40 (H) 0.76 - 1.27 mg/dL Final         Passed - Completed PHQ-2 or PHQ-9 in the last 360 days      Passed - Valid encounter within last 12 months    Recent Outpatient Visits           2 months ago DM type 1 with diabetic peripheral neuropathy ([place])   [place], Dani Gobble, PA-C   6 months ago Primary hypertension   [place], Erin E,  PA-C   1 year ago Diabetes mellitus without complication ([place])   [place] [facility] Vigg, Avanti, MD   1 year ago Lower extremity edema   [place] Vigg, Avanti, MD   1 year ago Lower extremity edema   [place] Vigg, Avanti, MD       Future Appointments             In 1 week [place], Barbaraann Faster, NP [place], [facility]

## 2022-10-24 NOTE — Patient Instructions (Incomplete)
Voltaren -- over the counter  Be Involved in Your Health Care:  Taking Medications When medications are taken as directed, they can greatly improve your health. But if they are not taken as instructed, they may not work. In some cases, not taking them correctly can be harmful. To help ensure your treatment remains effective and safe, understand your medications and how to take them.  Your lab results, notes and after visit summary will be available on My Chart. We strongly encourage you to use this feature. If lab results are abnormal the clinic will contact you with the appropriate steps. If the clinic does not contact you assume the results are satisfactory. You can always see your results on My Chart. If you have questions regarding your condition, please contact the clinic during office hours. You can also ask questions on My Chart.  We at [facility] are grateful that you chose [place] to provide care. We strive to provide excellent and compassionate care and are always looking for feedback. If you get a survey from the clinic please complete this.   Diabetic Neuropathy Diabetic neuropathy refers to nerve damage that is caused by diabetes. Over time, people with diabetes can develop nerve damage throughout the body. There are several types of diabetic neuropathy: Peripheral neuropathy. This is the most common type of diabetic neuropathy. It damages the nerves that carry signals between the spinal cord and other parts of the body (peripheral nerves). This usually affects nerves in the feet, legs, hands, and arms. Autonomic neuropathy. This type causes damage to nerves that control involuntary functions (autonomic nerves). Involuntary functions are functions of the body that you do not control. They include heartbeat, body temperature, blood pressure, urination, digestion, sweating, sexual function, or response to changes in blood glucose. Focal neuropathy. This type of nerve damage affects  one area of the body, such as an arm, a leg, or the face. The injury may involve one nerve or a small group of nerves. Focal neuropathy can be painful and unpredictable. It occurs most often in older adults with diabetes. This often develops suddenly, but usually improves over time and does not cause long-term problems. Proximal neuropathy. This type of nerve damage affects the nerves of the thighs, hips, buttocks, or legs. It causes severe pain, weakness, and muscle death (atrophy), usually in the thigh muscles. It is more common among older men and people who have type 2 diabetes. The length of recovery time may vary. What are the causes? Peripheral, autonomic, and focal neuropathies are caused by diabetes that is not well controlled with treatment. The cause of proximal neuropathy is not known, but it may be caused by inflammation related to uncontrolled blood glucose levels. What are the signs or symptoms? Peripheral neuropathy Peripheral neuropathy develops slowly over time. When the nerves of the feet and legs no longer work, you may experience: Burning, stabbing, or aching pain in the legs or feet. Pain or cramping in the legs or feet. Loss of feeling (numbness) and inability to feel pressure or pain in the feet. This can lead to: Thick calluses or sores on areas of constant pressure. Ulcers. Reduced ability to feel temperature changes. Foot deformities. Muscle weakness. Loss of balance or coordination. Autonomic neuropathy The symptoms of autonomic neuropathy vary depending on which nerves are affected. Symptoms may include: Problems with digestion, such as: Nausea or vomiting. Poor appetite. Bloating. Diarrhea or constipation. Trouble swallowing. Losing weight without trying to. Problems with the heart, blood, and lungs, such  as: Dizziness, especially when standing up. Fainting. Shortness of breath. Irregular heartbeat. Bladder problems, such as: Trouble starting or stopping  urination. Leaking urine. Trouble emptying the bladder. Urinary tract infections (UTIs). Problems with other body functions, such as: Sweat. You may sweat too much or too little. Temperature. You might get hot easily. Or, you might feel cold more than usual. Sexual function. Men may not be able to get or maintain an erection. Women may have vaginal dryness and difficulty with arousal. Focal neuropathy Symptoms affect only one area of the body. Common symptoms include: Numbness. Tingling. Burning pain. Prickling feeling. Very sensitive skin. Weakness. Inability to move (paralysis). Muscle twitching. Muscles getting smaller (wasting). Poor coordination. Double or blurred vision. Proximal neuropathy Sudden, severe pain in the hip, thigh, or buttocks. Pain may spread from the back into the legs (sciatica). Pain and numbness in the arms and legs. Tingling. Loss of bladder control or bowel control. Weakness and wasting of thigh muscles. Difficulty getting up from a seated position. Abdominal swelling. Unexplained weight loss. How is this diagnosed? Diagnosis varies depending on the type of neuropathy your health care provider suspects. Peripheral neuropathy Your health care provider will do a neurologic exam. This exam checks your reflexes, how you move, and what you can feel. You may have other tests, such as: Blood tests. Tests of the fluid that surrounds the spinal cord (lumbar puncture). CT scan. MRI. Checking the nerves that control muscles (electromyogram, or EMG). Checking how quickly signals pass through your nerves (nerve conduction study). Checking a small piece of a nerve using a microscope (biopsy). Autonomic neuropathy You may have tests, such as: Tests to measure your blood pressure and heart rate. You may be secured to an exam table that moves you from a lying position to an upright position (table tilt test). Breathing tests to check your lungs. Tests to check  how food moves through the digestive system (gastric emptying tests). Blood, sweat, or urine tests. Ultrasound of your bladder. Spinal fluid tests. Focal neuropathy This condition may be diagnosed with: A neurologic exam. CT scan. MRI. EMG. Nerve conduction study. Proximal neuropathy There is no test to diagnose this type of neuropathy. You may have tests to rule out other possible causes of this type of neuropathy. Tests may include: X-rays of your spine and lumbar region. Lumbar puncture. MRI. How is this treated? The goal of treatment is to keep nerve damage from getting worse. Treatment may include: Following your diabetes management plan. This will help keep your blood glucose level and your A1C level within your target range. This is the most important treatment. Using prescription pain medicine. Follow these instructions at home: Diabetes management Follow your diabetes management plan as told by your health care provider. Check your blood glucose levels. Keep your blood glucose in your target range. Have your A1C level checked at least two times a year, or as often as told. Take over the counter and prescription medicines only as told by your health care provider. This includes insulin and diabetes medicine.  Lifestyle  Do not use any products that contain nicotine or tobacco, such as cigarettes, e-cigarettes, and chewing tobacco. If you need help quitting, ask your health care provider. Be physically active every day. Include strength training and balance exercises. Follow a healthy meal plan. Work with your health care provider to manage your blood pressure. General instructions Ask your health care provider if the medicine prescribed to you requires you to avoid driving or using machinery. Check  your skin and feet every day for cuts, bruises, redness, blisters, or sores. Keep all follow-up visits. This is important. Contact a health care provider if: You have  burning, stabbing, or aching pain in your legs or feet. You are unable to feel pressure or pain in your feet. You develop problems with digestion, such as: Nausea. Vomiting. Bloating. Constipation. Diarrhea. Abdominal pain. You have difficulty with urination, such as: Inability to control when you urinate (incontinence). Inability to completely empty the bladder (retention). You feel as if your heart is racing (palpitations). You feel dizzy, weak, or faint when you stand up. Get help right away if: You cannot urinate. You have sudden weakness or loss of coordination. You have trouble speaking. You have pain or pressure in your chest. You have an irregular heartbeat. You have sudden inability to move a part of your body. These symptoms may represent a serious problem that is an emergency. Do not wait to see if the symptoms will go away. Get medical help right away. Call your local emergency services (911 in the [place] Do not drive yourself to the hospital. Summary Diabetic neuropathy is nerve damage that is caused by diabetes. It can cause numbness and pain in the arms, legs, digestive tract, heart, and other body systems. This condition is treated by keeping your blood glucose level and your A1C level within your target range. This can help prevent neuropathy from getting worse. Check your skin and feet every day for cuts, bruises, redness, blisters, or sores. Do not use any products that contain nicotine or tobacco, such as cigarettes, e-cigarettes, and chewing tobacco. This information is not intended to replace advice given to you by your health care provider. Make sure you discuss any questions you have with your health care provider. Document Revised: 12/07/2019 Document Reviewed: 12/07/2019 Elsevier Patient Education  [place].

## 2022-10-29 ENCOUNTER — Ambulatory Visit (INDEPENDENT_AMBULATORY_CARE_PROVIDER_SITE_OTHER): Payer: Medicare Other | Admitting: Nurse Practitioner

## 2022-10-29 ENCOUNTER — Encounter: Payer: Self-pay | Admitting: Nurse Practitioner

## 2022-10-29 VITALS — BP 133/71 | HR 76 | Temp 97.9°F | Ht 67.09 in | Wt 250.6 lb

## 2022-10-29 DIAGNOSIS — Z1159 Encounter for screening for other viral diseases: Secondary | ICD-10-CM

## 2022-10-29 DIAGNOSIS — E1069 Type 1 diabetes mellitus with other specified complication: Secondary | ICD-10-CM | POA: Diagnosis not present

## 2022-10-29 DIAGNOSIS — E1042 Type 1 diabetes mellitus with diabetic polyneuropathy: Secondary | ICD-10-CM | POA: Diagnosis not present

## 2022-10-29 DIAGNOSIS — Z951 Presence of aortocoronary bypass graft: Secondary | ICD-10-CM

## 2022-10-29 DIAGNOSIS — E1059 Type 1 diabetes mellitus with other circulatory complications: Secondary | ICD-10-CM | POA: Diagnosis not present

## 2022-10-29 DIAGNOSIS — I152 Hypertension secondary to endocrine disorders: Secondary | ICD-10-CM

## 2022-10-29 DIAGNOSIS — Z0000 Encounter for general adult medical examination without abnormal findings: Secondary | ICD-10-CM | POA: Diagnosis not present

## 2022-10-29 DIAGNOSIS — J4540 Moderate persistent asthma, uncomplicated: Secondary | ICD-10-CM | POA: Diagnosis not present

## 2022-10-29 DIAGNOSIS — E785 Hyperlipidemia, unspecified: Secondary | ICD-10-CM

## 2022-10-29 DIAGNOSIS — E1051 Type 1 diabetes mellitus with diabetic peripheral angiopathy without gangrene: Secondary | ICD-10-CM

## 2022-10-29 DIAGNOSIS — F419 Anxiety disorder, unspecified: Secondary | ICD-10-CM

## 2022-10-29 DIAGNOSIS — E103553 Type 1 diabetes mellitus with stable proliferative diabetic retinopathy, bilateral: Secondary | ICD-10-CM

## 2022-10-29 DIAGNOSIS — I2510 Atherosclerotic heart disease of native coronary artery without angina pectoris: Secondary | ICD-10-CM

## 2022-10-29 DIAGNOSIS — Z23 Encounter for immunization: Secondary | ICD-10-CM

## 2022-10-29 DIAGNOSIS — K219 Gastro-esophageal reflux disease without esophagitis: Secondary | ICD-10-CM

## 2022-10-29 DIAGNOSIS — E559 Vitamin D deficiency, unspecified: Secondary | ICD-10-CM | POA: Diagnosis not present

## 2022-10-29 DIAGNOSIS — N400 Enlarged prostate without lower urinary tract symptoms: Secondary | ICD-10-CM

## 2022-10-29 DIAGNOSIS — Z1211 Encounter for screening for malignant neoplasm of colon: Secondary | ICD-10-CM

## 2022-10-29 DIAGNOSIS — I700 Atherosclerosis of aorta: Secondary | ICD-10-CM | POA: Diagnosis not present

## 2022-10-29 DIAGNOSIS — F325 Major depressive disorder, single episode, in full remission: Secondary | ICD-10-CM

## 2022-10-29 DIAGNOSIS — E538 Deficiency of other specified B group vitamins: Secondary | ICD-10-CM

## 2022-10-29 LAB — MICROALBUMIN, URINE WAIVED
Creatinine, Urine Waived: 10 mg/dL (ref 10–300)
Microalb, Ur Waived: 30 mg/L — ABNORMAL HIGH (ref 0–19)
Microalb/Creat Ratio: >300 mg/g — ABNORMAL HIGH (ref <30)

## 2022-10-29 MED ORDER — TRIAMCINOLONE ACETONIDE 0.1 % EX CREA: 1.0000 | TOPICAL_CREAM | Freq: Two times a day (BID) | CUTANEOUS | 3 refills | Status: DC

## 2022-10-29 MED ORDER — ROSUVASTATIN CALCIUM 40 MG PO TABS: ORAL_TABLET | ORAL | 4 refills | Status: DC

## 2022-10-29 MED ORDER — AMITRIPTYLINE HCL 100 MG PO TABS: 100.0000 mg | ORAL_TABLET | Freq: Every day | ORAL | 2 refills | Status: DC

## 2022-10-29 MED ORDER — LOSARTAN POTASSIUM 50 MG PO TABS: 50.0000 mg | ORAL_TABLET | Freq: Every day | ORAL | 2 refills | Status: DC

## 2022-10-29 MED ORDER — TORSEMIDE 20 MG PO TABS: 40.0000 mg | ORAL_TABLET | Freq: Two times a day (BID) | ORAL | 4 refills | Status: DC

## 2022-10-29 MED ORDER — LIDOCAINE 5 % EX PTCH: 1.0000 | MEDICATED_PATCH | CUTANEOUS | 4 refills | Status: DC

## 2022-10-29 NOTE — Assessment & Plan Note (Signed)
Chronic, ongoing.  Continue collaboration with cardiology, recent notes reviewed.  Denies any recent CP.

## 2022-10-29 NOTE — Assessment & Plan Note (Addendum)
Chronic, ongoing with continued edema to BLE and color changes.  Will get back into vascular to consider pumps as not able to place compression hose on himself.  Triamcinolone cream ordered for dermatitis.  A1c recent with endo 10.5% and urine ALB 07 November 2022.  Continue collaboration with endo, recent notes reviewed. - Vaccines up to date, with exception of flu (refused) - Recommend he schedule eye exam, foot exam up to date. - On ARB and statin therapy

## 2022-10-29 NOTE — Assessment & Plan Note (Signed)
Chronic, ongoing.  Noted on past imaging.  Continue statin therapy for prevention and heavy focus on diet.

## 2022-10-29 NOTE — Assessment & Plan Note (Signed)
Chronic, ongoing.  Continue current medication regimen and adjust as needed. Lipid panel today. 

## 2022-10-29 NOTE — Assessment & Plan Note (Signed)
Chronic, ongoing.  Recommend he schedule eye exam ASAP.  A1c recent with endo 10.5% and urine ALB 07 November 2022.  Continue collaboration with endo, recent notes reviewed.

## 2022-10-29 NOTE — Assessment & Plan Note (Signed)
Chronic, ongoing.  Mag level today.  Continue current medication regimen and adjust as needed.  Risks of PPI use were discussed with patient including bone loss, C. Diff diarrhea, pneumonia, infections, CKD, electrolyte abnormalities.  Verbalizes understanding and chooses to continue the medication.

## 2022-10-29 NOTE — Assessment & Plan Note (Signed)
Refer to depression plan of care. 

## 2022-10-29 NOTE — Assessment & Plan Note (Signed)
BMI 39.15 with T1DM and CAD.  Recommended eating smaller high protein, low fat meals more frequently and exercising 30 mins a day 5 times a week with a goal of 10-15lb weight loss in the next 3 months. Patient voiced their understanding and motivation to adhere to these recommendations.

## 2022-10-29 NOTE — Assessment & Plan Note (Signed)
Chronic, stable.  Continue Albuterol as needed and in future start maintenance inhaler if worsening symptoms.  Return to pulmonary as needed.

## 2022-10-29 NOTE — Assessment & Plan Note (Addendum)
Chronic, ongoing.  BP at goal in office today.  Recommend he monitor BP at least a few mornings a week at home and document.  DASH diet at home.  Continue current medication regimen and adjust as needed.  Urine ALB 07 November 2022, continue Losartan for kidney protection.  Labs today: CBC, CMP, TSH, Lipid.

## 2022-10-29 NOTE — Assessment & Plan Note (Signed)
Chronic, ongoing.  A1c recent with endo 10.5% and urine ALB 07 November 2022.  Continue collaboration with endo, recent notes reviewed. Continue Amitriptyline and Gabapentin as ordered. Discussed with him importance of diabetes control to prevent worsening symptoms or wounds which could lead to loss of limb.

## 2022-10-29 NOTE — Assessment & Plan Note (Signed)
Chronic, stable.  Denies SI/HI.  Continue Amitriptyline and consider SSRI in future, although concern for weight gain with these.  Monitor closely.

## 2022-10-29 NOTE — Progress Notes (Signed)
BP 133/71   Pulse 76   Temp 97.9 F (36.6 C) (Oral)   Ht 5' 7.09" (1.704 m)   Wt 250 lb 9.6 oz (113.7 kg)   SpO2 99%   BMI 39.15 kg/m    Subjective:    Patient ID: Mike Kline, male    DOB: 25-Apr-1971, 52 y.o.   MRN: [id number]  HPI: Mike Kline is a 52 y.o. male presenting on 10/29/2022 for Medicare Wellness and follow-up visit. Current medical complaints include: none  He currently lives with: girlfriend and 28 year old son Interim Problems from his last visit: back pain and redness legs  DIABETES (TYPE 1) Followed by Dr. Gabriel Carina, endo, with last visit 10/20/22.  A1c 10.5% increased insulin and are working on pump.  Brought in Freestyle -- average glucose over 30 days = 233.  Time in Target 30 days: Above 72%, In Target 27%, Below 1%.  Has had 7 lows over past 30 days.  Was 52 years old when diagnosed with diabetes.  Does endorse frustration with his health, as was very healthy prior to his MI years ago.  Has ongoing redness to both legs with dry skin. Saw vascular 06/26/21 last, was to return as needed for deep venous insufficiency.  Does not wear compression hose at home, difficulty getting on. Hypoglycemic episodes:as above Polydipsia/polyuria: no Visual disturbance: no Chest pain: no Paresthesias: no Glucose Monitoring: yes  Accucheck frequency: TID  Fasting glucose:  Post prandial:  Evening:  Before meals: Taking Insulin?: yes  Long acting insulin: Lantus 50 units  Short acting insulin: Humalog 6-10 units sliding scale Blood Pressure Monitoring: not checking Retinal Examination: Not up to Date Foot Exam: Up to Date Pneumovax: Up to Date Influenza: Not Up to Date Aspirin: no   HYPERTENSION / HYPERLIPIDEMIA Last saw cardiology 06/02/22, they recommended a sleep study.  Continues Albuterol as needed for asthma, no recent pulmonary visit. Satisfied with current treatment? yes Duration of hypertension: chronic BP monitoring frequency: not checking BP range:  BP  medication side effects: no Duration of hyperlipidemia: chronic Cholesterol medication side effects: no Cholesterol supplements: none Medication compliance: good compliance Aspirin: no Recent stressors: no Recurrent headaches: no Visual changes: no Palpitations: no Dyspnea: no Chest pain: no Lower extremity edema: no Dizzy/lightheaded: no   GERD Continues Omeprazole. GERD control status: stable Satisfied with current treatment? yes Heartburn frequency: occasional Medication side effects: no  Medication compliance: stable Dysphagia: no Odynophagia:  no Hematemesis: no Blood in stool: no EGD: yes   BACK PAIN Chronic issue -- ongoing.  Walking from check out to car is tiring.   Duration: days Mechanism of injury: unknown Location: bilateral and low back Onset: gradual Severity: 10/10 Quality: dull, aching, and throbbing Frequency: intermittent Radiation: none Aggravating factors: lifting, movement, and bending Alleviating factors: rest Status: fluctuating Treatments attempted: rest and APAP, BC Powder Relief with NSAIDs?: No NSAIDs Taken Nighttime pain:  no Paresthesias / decreased sensation:  no Bowel / bladder incontinence:  no Fevers:  no Dysuria / urinary frequency:  no   Functional Status Survey: Is the patient deaf or have difficulty hearing?: No Does the patient have difficulty seeing, even when wearing glasses/contacts?: No Does the patient have difficulty concentrating, remembering, or making decisions?: No Does the patient have difficulty walking or climbing stairs?: No Does the patient have difficulty dressing or bathing?: No Does the patient have difficulty doing errands alone such as visiting a doctor's office or shopping?: No     07/24/2021   11:09  AM 11/15/2021   11:29 AM 04/24/2022   11:10 AM 07/24/2022   10:19 AM 10/29/2022   10:20 AM  Fall Risk  Falls in the past year? 0  0 0 0  Was there an injury with Fall? 0  0 0 0  Fall Risk Category  Calculator 0  0 0 0  Fall Risk Category (Retired) Low  Low Low   (RETIRED) Patient Fall Risk Level  Low fall risk  Low fall risk   Patient at Risk for Falls Due to No Fall Risks  No Fall Risks No Fall Risks No Fall Risks  Fall risk Follow up Falls evaluation completed  Falls evaluation completed Falls evaluation completed Falls evaluation completed        10/29/2022   10:20 AM 07/24/2022   10:20 AM 04/24/2022   11:10 AM 07/24/2021   11:09 AM 03/19/2021    9:34 AM  Depression screen PHQ 2/9  Decreased Interest 0 2 0 1 0  Down, Depressed, Hopeless 0 0 0 2   PHQ - 2 Score 0 2 0 3 0  Altered sleeping 0 2 0 0   Tired, decreased energy 2 2 1 3    Change in appetite 0 0 1 0   Feeling bad or failure about yourself  1 0 0 1   Trouble concentrating 0 0 0 0   Moving slowly or fidgety/restless 0 0 0 0   Suicidal thoughts 0 0 0 0   PHQ-9 Score 3 6 2 7    Difficult doing work/chores Not difficult at all Not difficult at all Not difficult at all Somewhat difficult        10/29/2022   10:20 AM 07/24/2022   10:20 AM 04/24/2022   11:10 AM 07/24/2021   11:10 AM  GAD 7 : Generalized Anxiety Score  Nervous, Anxious, on Edge 0 0 0 1  Control/stop worrying 0 1 0 2  Worry too much - different things 0 1 0 2  Trouble relaxing 0 1 0 0  Restless 0 0 0 0  Easily annoyed or irritable 0 0 0 1  Afraid - awful might happen 0 0 0 0  Total GAD 7 Score 0 3 0 6  Anxiety Difficulty Not difficult at all Not difficult at all  Not difficult at all   Past Medical History:  Past Medical History:  Diagnosis Date   Adhesive capsulitis    permanent disability 07/2010, left shoulder   Anginal pain (HCC)    Blindness of left eye    Diabetes mellitus without complication (HCC)    Diabetic, retinopathy, proliferative (HCC)    GERD (gastroesophageal reflux disease)    Hyperlipidemia    Hypertension     Surgical History:  Past Surgical History:  Procedure Laterality Date   APPENDECTOMY     CABG x4  06/02/2019    [facility]   CARPAL TUNNEL RELEASE Bilateral    cataract surgery Bilateral 2014   EYE SURGERY Left    LAA clip Left 06/02/2019   [place]   LEFT HEART CATH AND CORONARY ANGIOGRAPHY Left 05/22/2019   Procedure: LEFT HEART CATH AND CORONARY ANGIOGRAPHY;  Surgeon: Yolonda Kida, MD;  Location: [facility];  Service: Cardiovascular;  Laterality: Left;   SHOULDER SURGERY Left 2009    Medications:  Current Outpatient Medications on File Prior to Visit  Medication Sig   albuterol (VENTOLIN HFA) 108 (90 Base) MCG/ACT inhaler Inhale 2 puffs into the lungs every 6 (six) hours  as needed for wheezing or shortness of breath (Cough).   Continuous Blood Gluc Sensor (FREESTYLE LIBRE 2 SENSOR) MISC PLACE 1 TO THE BACK OF YOUR UPPER ARM, REPLACE EVERY 14 DAYS   gabapentin (NEURONTIN) 300 MG capsule Take 1 capsule (300 mg total) by mouth 3 (three) times daily.   HUMALOG KWIKPEN 100 UNIT/ML KiwkPen USE THREE TIMES A DAY AS DIRECTED.(UP TO 50 UNITS A DAY OR AS DIRECTED BY SLIDING SCALE) (Patient taking differently: Inject 6-10 Units into the skin 3 (three) times daily.)   insulin glargine (LANTUS SOLOSTAR) 100 UNIT/ML Solostar Pen Inject 40 Units into the skin daily at 10 pm.   metoprolol tartrate (LOPRESSOR) 50 MG tablet Take 50 mg by mouth 2 (two) times daily.   omeprazole (PRILOSEC) 40 MG capsule Take 40 mg by mouth daily.   potassium chloride SA (KLOR-CON M) 20 MEQ tablet Take 20 mEq by mouth 2 (two) times daily.   sildenafil (VIAGRA) 50 MG tablet Take 50 mg by mouth daily as needed.   No current facility-administered medications on file prior to visit.    Allergies:  No Known Allergies  Social History:  Social History   Socioeconomic History   Marital status: Divorced    Spouse name: Not on file   Number of children: Not on file   Years of education: Not on file   Highest education level: Not on file  Occupational History   Occupation: disability  Tobacco Use   Smoking  status: Never   Smokeless tobacco: Never   Tobacco comments:    Never  Vaping Use   Vaping Use: Never used  Substance and Sexual Activity   Alcohol use: Not Currently    Comment: socially   Drug use: No   Sexual activity: Not on file  Other Topics Concern   Not on file  Social History Narrative   Not on file   Social Determinants of Health   Financial Resource Strain: Low Risk  (07/29/2020)   Overall Financial Resource Strain (CARDIA)    Difficulty of Paying Living Expenses: Not hard at all  Food Insecurity: No Food Insecurity (07/29/2020)   Hunger Vital Sign    Worried About Running Out of Food in the Last Year: Never true    [place] in the Last Year: Never true  Transportation Needs: No Transportation Needs (07/29/2020)   PRAPARE - Hydrologist (Medical): No    Lack of Transportation (Non-Medical): No  Physical Activity: Inactive (07/29/2020)   Exercise Vital Sign    Days of Exercise per Week: 0 days    Minutes of Exercise per Session: 0 min  Stress: No Stress Concern Present (07/29/2020)   [place]    Feeling of Stress : Not at all  Social Connections: Not on file  Intimate Partner Violence: Not on file   Social History   Tobacco Use  Smoking Status Never  Smokeless Tobacco Never  Tobacco Comments   Never   Social History   Substance and Sexual Activity  Alcohol Use Not Currently   Comment: socially    Family History:  Family History  Problem Relation Age of Onset   Bone cancer Mother     Past medical history, surgical history, medications, allergies, family history and social history reviewed with patient today and changes made to appropriate areas of the chart.   ROS All other ROS negative except what is listed above and in  the HPI.      Objective:    BP 133/71   Pulse 76   Temp 97.9 F (36.6 C) (Oral)   Ht 5' 7.09" (1.704 m)   Wt 250 lb  9.6 oz (113.7 kg)   SpO2 99%   BMI 39.15 kg/m   Wt Readings from Last 3 Encounters:  10/29/22 250 lb 9.6 oz (113.7 kg)  07/24/22 239 lb 12.8 oz (108.8 kg)  04/24/22 238 lb 6.4 oz (108.1 kg)    Physical Exam Vitals and nursing note reviewed.  Constitutional:      General: He is awake. He is not in acute distress.    Appearance: He is well-developed and well-groomed. He is obese. He is not ill-appearing or toxic-appearing.  HENT:     Head: Normocephalic.     Right Ear: Hearing and external ear normal.     Left Ear: Hearing and external ear normal.  Eyes:     General: Lids are normal.     Extraocular Movements: Extraocular movements intact.     Conjunctiva/sclera: Conjunctivae normal.  Neck:     Thyroid: No thyromegaly.     Vascular: No carotid bruit.  Cardiovascular:     Rate and Rhythm: Normal rate and regular rhythm.     Heart sounds: Normal heart sounds.     Comments: Bilateral lower legs with moderate hemosiderin staining and xerosis.   Pulmonary:     Effort: No accessory muscle usage or respiratory distress.     Breath sounds: Normal breath sounds.  Abdominal:     General: Bowel sounds are normal. There is no distension.     Palpations: Abdomen is soft.     Tenderness: There is no abdominal tenderness.  Musculoskeletal:     Cervical back: Full passive range of motion without pain.     Right lower leg: 2+ Edema present.     Left lower leg: 2+ Edema present.  Lymphadenopathy:     Cervical: No cervical adenopathy.  Skin:    General: Skin is warm.     Capillary Refill: Capillary refill takes less than 2 seconds.  Neurological:     Mental Status: He is alert and oriented to person, place, and time.     Deep Tendon Reflexes: Reflexes are normal and symmetric.     Reflex Scores:      Brachioradialis reflexes are 2+ on the right side and 2+ on the left side.      Patellar reflexes are 2+ on the right side and 2+ on the left side. Psychiatric:        Attention and  Perception: Attention normal.        Mood and Affect: Mood normal.        Speech: Speech normal.        Behavior: Behavior normal. Behavior is cooperative.        Thought Content: Thought content normal.    Diabetic Foot Exam - Simple   Simple Foot Form Visual Inspection See comments: Yes Sensation Testing Intact to touch and monofilament testing bilaterally: Yes Pulse Check See comments: Yes Comments 1+ pulses bilateral and xerosis noted        10/29/2022   12:41 PM 07/29/2020    9:11 AM  6CIT Screen  What Year? 0 points 0 points  What month? 0 points 0 points  What time? 0 points 0 points  Count back from 20 0 points 0 points  Months in reverse 0 points 2 points  Repeat phrase 0  points 0 points  Total Score 0 points 2 points   Results for orders placed or performed in visit on 10/29/22  Microalbumin, Urine Waived  Result Value Ref Range   Microalb, Ur Waived 30 (H) 0 - 19 mg/L   Creatinine, Urine Waived 10 10 - 300 mg/dL   Microalb/Creat Ratio >300 (H) <30 mg/g      Assessment & Plan:   Problem List Items Addressed This Visit       Cardiovascular and Mediastinum   Aortic atherosclerosis (HCC)    Chronic, ongoing.  Noted on past imaging.  Continue statin therapy for prevention and heavy focus on diet.      Relevant Medications   metoprolol tartrate (LOPRESSOR) 50 MG tablet   losartan (COZAAR) 50 MG tablet   rosuvastatin (CRESTOR) 40 MG tablet   torsemide (DEMADEX) 20 MG tablet   Other Relevant Orders   Hepatic function panel   Coronary artery disease involving native coronary artery of native heart without angina pectoris    Chronic, ongoing.  Continue collaboration with cardiology, recent notes reviewed.  Denies any recent CP.        Relevant Medications   metoprolol tartrate (LOPRESSOR) 50 MG tablet   losartan (COZAAR) 50 MG tablet   rosuvastatin (CRESTOR) 40 MG tablet   torsemide (DEMADEX) 20 MG tablet   Other Relevant Orders   Hepatic function  panel   Hypertension associated with type 1 diabetes mellitus (HCC)    Chronic, ongoing.  BP at goal in office today.  Recommend he monitor BP at least a few mornings a week at home and document.  DASH diet at home.  Continue current medication regimen and adjust as needed.  Urine ALB 07 November 2022, continue Losartan for kidney protection.  Labs today: CBC, CMP, TSH, Lipid.         Relevant Medications   metoprolol tartrate (LOPRESSOR) 50 MG tablet   losartan (COZAAR) 50 MG tablet   rosuvastatin (CRESTOR) 40 MG tablet   torsemide (DEMADEX) 20 MG tablet   Other Relevant Orders   Microalbumin, Urine Waived (Completed)   TSH   Type 1 diabetes mellitus with peripheral vascular disease (HCC)    Chronic, ongoing with continued edema to BLE and color changes.  Will get back into vascular to consider pumps as not able to place compression hose on himself.  Triamcinolone cream ordered for dermatitis.  A1c recent with endo 10.5% and urine ALB 07 November 2022.  Continue collaboration with endo, recent notes reviewed. - Vaccines up to date, with exception of flu (refused) - Recommend he schedule eye exam, foot exam up to date. - On ARB and statin therapy      Relevant Medications   metoprolol tartrate (LOPRESSOR) 50 MG tablet   losartan (COZAAR) 50 MG tablet   rosuvastatin (CRESTOR) 40 MG tablet   torsemide (DEMADEX) 20 MG tablet   Other Relevant Orders   Ambulatory referral to Vascular Surgery     Respiratory   Moderate asthma without complication    Chronic, stable.  Continue Albuterol as needed and in future start maintenance inhaler if worsening symptoms.  Return to pulmonary as needed.        Relevant Orders   CBC with Differential/Platelet     Digestive   GERD (gastroesophageal reflux disease)    Chronic, ongoing.  Mag level today.  Continue current medication regimen and adjust as needed.  Risks of PPI use were discussed with patient including bone loss, C. Diff diarrhea, pneumonia,  infections, CKD, electrolyte abnormalities.  Verbalizes understanding and chooses to continue the medication.       Relevant Orders   Magnesium     Endocrine   DM type 1 with diabetic peripheral neuropathy (HCC)    Chronic, ongoing.  A1c recent with endo 10.5% and urine ALB 07 November 2022.  Continue collaboration with endo, recent notes reviewed. Continue Amitriptyline and Gabapentin as ordered. Discussed with him importance of diabetes control to prevent worsening symptoms or wounds which could lead to loss of limb.      Relevant Medications   amitriptyline (ELAVIL) 100 MG tablet   losartan (COZAAR) 50 MG tablet   rosuvastatin (CRESTOR) 40 MG tablet   Other Relevant Orders   Microalbumin, Urine Waived (Completed)   Ambulatory referral to Sleep Studies   Hyperlipidemia due to type 1 diabetes mellitus (HCC)    Chronic, ongoing.  Continue current medication regimen and adjust as needed.  Lipid panel today.      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  Recommend he schedule eye exam ASAP.  A1c recent with endo 10.5% and urine ALB 07 November 2022.  Continue collaboration with endo, recent notes reviewed.      Relevant Medications   losartan (COZAAR) 50 MG tablet   rosuvastatin (CRESTOR) 40 MG tablet   Other Relevant Orders   Microalbumin, Urine Waived (Completed)   Ambulatory referral to Sleep Studies     Other   Anxiety    Refer to depression plan of care.      Relevant Medications   amitriptyline (ELAVIL) 100 MG tablet   Depression    Chronic, stable.  Denies SI/HI.  Continue Amitriptyline and consider SSRI in future, although concern for weight gain with these.  Monitor closely.         Relevant Medications   amitriptyline (ELAVIL) 100 MG tablet   Morbid obesity (HCC)    BMI 39.15 with T1DM and CAD.  Recommended eating smaller high protein, low fat meals more frequently and exercising 30 mins a day 5 times a week with a goal of 10-15lb weight loss in the next 3 months. Patient voiced their understanding and motivation to adhere to these recommendations.       Other Visit Diagnoses     Medicare annual wellness visit, subsequent    -  Primary   Medicare wellness due and performed with patient today.   Benign prostatic hyperplasia without lower urinary tract symptoms       Check PSA on labs today.   Relevant Orders   PSA   Vitamin D deficiency       History of low levels reported, check today and start supplement as needed.   Relevant Orders   VITAMIN D 25 Hydroxy (Vit-D Deficiency, Fractures)   Vitamin B12 deficiency       History of low levels reported, check today and start supplement as needed.   Relevant Orders   Vitamin B12   Colon cancer screening       GI referral in place   Relevant Orders   Ambulatory referral to Gastroenterology   Need for hepatitis C screening test       Hep C  screen on labs today per guidelines for one time screening, discussed with patient.   Relevant Orders   Hepatitis C antibody   Need for Td vaccine       Td vaccine in office today.   Relevant Orders   Td vaccine greater than or equal to 7yo preservative free IM (Completed)       Discussed aspirin prophylaxis for myocardial infarction prevention and decision was it was not indicated  LABORATORY TESTING:  Health maintenance labs ordered today as discussed above.   The natural history of prostate cancer and ongoing controversy regarding screening and potential treatment outcomes of prostate cancer has been discussed with the patient. The meaning of a false positive PSA and a false negative PSA has been discussed. He indicates understanding of the limitations of this screening  test and wishes to proceed with screening PSA testing.   IMMUNIZATIONS:   - Tdap: Tetanus vaccination status reviewed: Td vaccination indicated and given today. - Influenza: Refused - Pneumovax: Up to date - Prevnar: Not applicable - Zostavax vaccine: Refused  SCREENING: - Colonoscopy: Ordered today  Discussed with patient purpose of the colonoscopy is to detect colon cancer at curable precancerous or early stages   - AAA Screening: Not applicable  -Hearing Test: Not applicable  -Spirometry: Not applicable   PATIENT COUNSELING:    Sexuality: Discussed sexually transmitted diseases, partner selection, use of condoms, avoidance of unintended pregnancy  and contraceptive alternatives.   Advised to avoid cigarette smoking.  I discussed with the patient that most people either abstain from alcohol or drink within safe limits (<=14/week and <=4 drinks/occasion for males, <=7/weeks and <= 3 drinks/occasion for females) and that the risk for alcohol disorders and other health effects rises proportionally with the number of drinks per week and how often a drinker exceeds daily limits.  Discussed cessation/primary prevention of drug use and availability of treatment for abuse.   Diet: Encouraged to adjust caloric intake to maintain  or achieve ideal body weight, to reduce intake of dietary saturated fat and total fat, to limit sodium intake by avoiding high sodium foods and not adding table salt, and to maintain adequate dietary potassium and calcium preferably from fresh fruits, vegetables, and low-fat dairy products.    Stressed the importance of regular exercise  Injury prevention: Discussed safety belts, safety helmets, smoke detector, smoking near bedding or upholstery.   Dental health: Discussed importance of regular tooth brushing, flossing, and dental visits.   Follow up plan: NEXT PREVENTATIVE PHYSICAL DUE IN 1 YEAR. Return in about 8 weeks (around 12/24/2022) for VASCULAR DISEASE  AND OSA.

## 2022-10-30 NOTE — Progress Notes (Signed)
Contacted via MyChart   Good evening Dezmund, your labs have returned: - Magnesium is a little elevated, but we will continue to monitor this on occasion.  If taking supplement for this cut back to every other day. - Vitamin D level is low, I recommend you start over the counter Vitamin D3 2000 units daily and we will recheck in about 3-6 months.  This is important for bone and muscle health. - Liver function is normal. - CBC shows no anemia or infection. - Remainder of labs are stable.  B12 is on lower side of normal, you would benefit from Vitamin B12 1000 MCG daily for nervous system health. - Hep C screening is pending.  Any questions? Keep being awesome!!  Thank you for allowing me to participate in your care.  I appreciate you. Kindest regards, Jake Fuhrmann

## 2022-10-31 LAB — CBC WITH DIFFERENTIAL/PLATELET
Basophils Absolute: 0 10*3/uL (ref 0.0–0.2)
Basos: 1 %
EOS (ABSOLUTE): 0.3 10*3/uL (ref 0.0–0.4)
Eos: 5 %
Hematocrit: 40.9 % (ref 37.5–51.0)
Hemoglobin: 13 g/dL (ref 13.0–17.7)
Immature Grans (Abs): 0 10*3/uL (ref 0.0–0.1)
Immature Granulocytes: 1 %
Lymphocytes Absolute: 1.5 10*3/uL (ref 0.7–3.1)
Lymphs: 27 %
MCH: 25.5 pg — ABNORMAL LOW (ref 26.6–33.0)
MCHC: 31.8 g/dL (ref 31.5–35.7)
MCV: 80 fL (ref 79–97)
Monocytes Absolute: 0.5 10*3/uL (ref 0.1–0.9)
Monocytes: 9 %
Neutrophils Absolute: 3.2 10*3/uL (ref 1.4–7.0)
Neutrophils: 57 %
Platelets: 266 10*3/uL (ref 150–450)
RBC: 5.09 x10E6/uL (ref 4.14–5.80)
RDW: 14 % (ref 11.6–15.4)
WBC: 5.5 10*3/uL (ref 3.4–10.8)

## 2022-10-31 LAB — PSA: Prostate Specific Ag, Serum: 0.2 ng/mL (ref 0.0–4.0)

## 2022-10-31 LAB — HEPATIC FUNCTION PANEL
ALT: 25 IU/L (ref 0–44)
AST: 26 IU/L (ref 0–40)
Albumin: 4.3 g/dL (ref 3.8–4.9)
Alkaline Phosphatase: 173 IU/L — ABNORMAL HIGH (ref 44–121)
Bilirubin Total: 0.2 mg/dL (ref 0.0–1.2)
Bilirubin, Direct: <0.1 mg/dL (ref 0.00–0.40)
Total Protein: 7.1 g/dL (ref 6.0–8.5)

## 2022-10-31 LAB — TSH: TSH: 2.64 u[IU]/mL (ref 0.450–4.500)

## 2022-10-31 LAB — MAGNESIUM: Magnesium: 2.4 mg/dL — ABNORMAL HIGH (ref 1.6–2.3)

## 2022-10-31 LAB — VITAMIN D 25 HYDROXY (VIT D DEFICIENCY, FRACTURES): Vit D, 25-Hydroxy: 17.5 ng/mL — ABNORMAL LOW (ref 30.0–100.0)

## 2022-10-31 LAB — HEPATITIS C ANTIBODY: Hep C Virus Ab: NONREACTIVE

## 2022-10-31 LAB — VITAMIN B12: Vitamin B-12: 498 pg/mL (ref 232–1245)

## 2022-11-03 ENCOUNTER — Encounter: Payer: Self-pay | Admitting: Nurse Practitioner

## 2022-11-03 DIAGNOSIS — G8929 Other chronic pain: Secondary | ICD-10-CM | POA: Insufficient documentation

## 2022-11-04 DIAGNOSIS — E109 Type 1 diabetes mellitus without complications: Secondary | ICD-10-CM | POA: Diagnosis not present

## 2022-11-04 DIAGNOSIS — Z794 Long term (current) use of insulin: Secondary | ICD-10-CM | POA: Diagnosis not present

## 2022-11-09 DIAGNOSIS — Z419 Encounter for procedure for purposes other than remedying health state, unspecified: Secondary | ICD-10-CM | POA: Diagnosis not present

## 2022-11-10 ENCOUNTER — Other Ambulatory Visit (INDEPENDENT_AMBULATORY_CARE_PROVIDER_SITE_OTHER): Payer: Self-pay | Admitting: Nurse Practitioner

## 2022-11-10 DIAGNOSIS — R609 Edema, unspecified: Secondary | ICD-10-CM

## 2022-11-10 DIAGNOSIS — R0989 Other specified symptoms and signs involving the circulatory and respiratory systems: Secondary | ICD-10-CM

## 2022-11-11 ENCOUNTER — Encounter: Payer: Self-pay | Admitting: *Deleted

## 2022-11-18 ENCOUNTER — Ambulatory Visit (INDEPENDENT_AMBULATORY_CARE_PROVIDER_SITE_OTHER): Payer: Medicare Other | Admitting: Nurse Practitioner

## 2022-11-18 ENCOUNTER — Encounter (INDEPENDENT_AMBULATORY_CARE_PROVIDER_SITE_OTHER): Payer: Medicare Other

## 2022-11-18 ENCOUNTER — Encounter (INDEPENDENT_AMBULATORY_CARE_PROVIDER_SITE_OTHER): Payer: Self-pay

## 2022-11-19 ENCOUNTER — Encounter: Payer: Self-pay | Admitting: Nurse Practitioner

## 2022-11-20 NOTE — Telephone Encounter (Signed)
Paperwork was signed and patient was called and informed it was ready for pick up

## 2022-11-21 ENCOUNTER — Encounter: Payer: Self-pay | Admitting: Nurse Practitioner

## 2022-11-23 ENCOUNTER — Other Ambulatory Visit: Payer: Self-pay

## 2022-11-23 MED ORDER — OMEPRAZOLE 40 MG PO CPDR: 40.0000 mg | DELAYED_RELEASE_CAPSULE | Freq: Every day | ORAL | 3 refills | Status: DC

## 2022-12-01 ENCOUNTER — Encounter: Payer: Self-pay | Admitting: Nurse Practitioner

## 2022-12-01 ENCOUNTER — Telehealth (INDEPENDENT_AMBULATORY_CARE_PROVIDER_SITE_OTHER): Payer: Medicare Other | Admitting: Nurse Practitioner

## 2022-12-01 VITALS — BP 127/88 | HR 91 | Temp 101.1°F | Ht 67.0 in | Wt 240.0 lb

## 2022-12-01 DIAGNOSIS — R051 Acute cough: Secondary | ICD-10-CM | POA: Diagnosis not present

## 2022-12-01 MED ORDER — ALBUTEROL SULFATE HFA 108 (90 BASE) MCG/ACT IN AERS: 2.0000 | INHALATION_SPRAY | Freq: Four times a day (QID) | RESPIRATORY_TRACT | 4 refills | Status: DC | PRN

## 2022-12-01 MED ORDER — HYDROCOD POLI-CHLORPHE POLI ER 10-8 MG/5ML PO SUER: 5.0000 mL | Freq: Every evening | ORAL | 0 refills | Status: DC | PRN

## 2022-12-01 MED ORDER — NIRMATRELVIR/RITONAVIR (PAXLOVID) TABLET (RENAL DOSING): 2.0000 | ORAL_TABLET | Freq: Two times a day (BID) | ORAL | 0 refills | Status: AC

## 2022-12-01 NOTE — Patient Instructions (Signed)
Fever, Adult     A fever is a high body temperature that is 100.4F (38C) or higher. Brief mild or moderate fevers often have no lasting effects. They often do not need treatment. Moderate or high fevers can feel uncomfortable. Sometimes, they can be a sign of a serious problem. A fever that keeps coming back or that lasts a long time may cause you to lose water in your body (get dehydrated). You can use a thermometer to check for a fever. Temperature can change with: Age. Time of day. Where the temperature is taken, such as: In the mouth. In the opening of the butt (anus). In the ear. Under the arm. On the forehead. Follow these instructions at home: Medicines Take over-the-counter and prescription medicines only as told by your doctor. Follow instructions on how much medicine to take and how often. If you were prescribed antibiotics, take them as told by your doctor. Do not stop taking them even if you start to feel better. General instructions Watch for any changes in your symptoms. Tell your doctor about them. Rest as needed. Drink enough fluid to keep your pee (urine) pale yellow. Bathe or sponge bathe with room-temperature water as needed. This helps to lower your body temperature. Do not use cold water. Do not use too many blankets or wear clothes that are too heavy. You should stay home from work and public places for at least 24 hours after your fever is gone. Your fever should be gone without the use of medicines. Contact a doctor if: You vomit or have watery poop (diarrhea) that does not get better. You cannot eat or drink without vomiting. It hurts when you pee. Your symptoms do not get better with treatment or you have new symptoms. You have a rash on your skin. You have signs of not having enough water in your body, such as: Dark pee, very little pee, or no pee. Cracked lips or dry mouth. Sunken eyes. Sleepiness. Weakness. Get help right away if: You are short of  breath or have trouble breathing. You are dizzy or pass out (faint). You feel mixed up or confused. You have very bad pain in your belly (abdomen). These symptoms may be an emergency. Get help right away. Call 911. Do not wait to see if the symptoms will go away. Do not drive yourself to the hospital. This information is not intended to replace advice given to you by your health care provider. Make sure you discuss any questions you have with your health care provider. Document Revised: 04/28/2022 Document Reviewed: 04/28/2022 Elsevier Patient Education  2023 Elsevier Inc.  

## 2022-12-01 NOTE — Assessment & Plan Note (Signed)
Acute for 24 hours with fever and other URI symptoms, unable to get to office for Covid testing.  His partner will test at home and notify PCP of results.  If positive testing we discussed initiation of Paxlovid, would hold statin during treatment.  He is agreeable to this as has history of hospitalization with Covid.  At this time symptoms present only for 24 hours, will not start abx therapy -- plan on sending in on Friday is symptoms ongoing (Zpack), discussed at length with patient.  Tussionex script sent.  Can not take Prednisone due to poorly controlled diabetes.  Recommend: - Increased rest - Increasing Fluids - Acetaminophen / ibuprofen as needed for fever/pain.  - Salt water gargling, chloraseptic spray and throat lozenges - Mucinex.  - Humidifying the air.  Return in one week for cough.

## 2022-12-01 NOTE — Addendum Note (Signed)
Addended by: Aura Dials T on: 12/01/2022 03:30 PM   Modules accepted: Orders

## 2022-12-01 NOTE — Progress Notes (Signed)
BP 127/88   Pulse 91   Temp (!) 101.1 F (38.4 C) (Oral)   Ht  (1.702 m)   Wt 240 lb (108.9 kg)   SpO2 96%   BMI 37.59 kg/m    Subjective:    Patient ID: Mike Kline, male    DOB: Dec 16, 1970, 52 y.o.   MRN: [id number]  HPI: Mike Kline is a 52 y.o. male  Chief Complaint  Patient presents with   URI    Pt states he has been having a cough, congestion, body aches, headache, sore throat, and chills since yesterday morning. Has tried taking tylenol, mucinex, and robitussin.    This visit was completed via video visit through MyChart due to the restrictions of the COVID-19 pandemic. All issues as above were discussed and addressed. Physical exam was done as above through visual confirmation on video through MyChart. If it was felt that the patient should be evaluated in the office, they were directed there. The patient verbally consented to this visit. Location of the patient: home Location of the provider: work Those involved with this call:  Provider: Aura Dials, DNP CMA: [name], CMA Front Desk/Registration: Ozella Almond  Time spent on call:  21 minutes with patient face to face via video conference. More than 50% of this time was spent in counseling and coordination of care. 15 minutes total spent in review of patient's record and preparation of their chart.  I verified patient identity using two factors (patient name and date of birth). Patient consents verbally to being seen via telemedicine visit today.    UPPER RESPIRATORY TRACT INFECTION Reports having a cough, congestion, body aches, and headache since yesterday morning.  Has not Covid tested at home.  Had some Covid vaccines. Fever: yes Cough: yes Shortness of breath: yes Wheezing: yes Chest pain: no Chest tightness: yes Chest congestion: yes Nasal congestion: yes Runny nose: yes Post nasal drip: yes Sneezing: no Sore throat: yes Swollen glands: no Sinus pressure: yes Headache: yes Face  pain: yes Toothache: no Ear pain: none Ear pressure: none Eyes red/itching:no Eye drainage/crusting: no  Vomiting: no Rash: no Fatigue: yes Sick contacts: yes his son Strep contacts: no  Context: fluctuating Recurrent sinusitis: no Relief with OTC cold/cough medications: no  Treatments attempted: Tylenol, Mucinex, and Robitussin    Relevant past medical, surgical, family and social history reviewed and updated as indicated. Interim medical history since our last visit reviewed. Allergies and medications reviewed and updated.  Review of Systems  Constitutional:  Positive for chills, fatigue and fever. Negative for activity change, appetite change and diaphoresis.  HENT:  Positive for congestion, postnasal drip, rhinorrhea, sinus pressure and sore throat. Negative for ear discharge, ear pain, sinus pain, sneezing and voice change.   Respiratory:  Positive for cough, chest tightness, shortness of breath and wheezing.   Cardiovascular:  Negative for chest pain, palpitations and leg swelling.  Gastrointestinal: Negative.   Neurological:  Positive for headaches.  Psychiatric/Behavioral: Negative.      Per HPI unless specifically indicated above     Objective:    BP 127/88   Pulse 91   Temp (!) 101.1 F (38.4 C) (Oral)   Ht  (1.702 m)   Wt 240 lb (108.9 kg)   SpO2 96%   BMI 37.59 kg/m   Wt Readings from Last 3 Encounters:  12/01/22 240 lb (108.9 kg)  10/29/22 250 lb 9.6 oz (113.7 kg)  07/24/22 239 lb 12.8 oz (108.8 kg)  Physical Exam Vitals and nursing note reviewed.  Constitutional:      General: He is awake. He is not in acute distress.    Appearance: He is well-developed and well-groomed. He is obese. He is not ill-appearing or toxic-appearing.  HENT:     Head: Normocephalic.     Right Ear: Hearing normal. No drainage.     Left Ear: Hearing normal. No drainage.  Eyes:     General: Lids are normal.        Right eye: No discharge.        Left eye: No  discharge.     Conjunctiva/sclera: Conjunctivae normal.  Pulmonary:     Effort: Pulmonary effort is normal. No accessory muscle usage or respiratory distress.     Comments: Unable to auscultate due to virtual visit only.  Intermittent cough present, no noted SOB with talking. Musculoskeletal:     Cervical back: Normal range of motion.  Neurological:     Mental Status: He is alert and oriented to person, place, and time.  Psychiatric:        Mood and Affect: Mood normal.        Behavior: Behavior normal. Behavior is cooperative.        Thought Content: Thought content normal.        Judgment: Judgment normal.    Results for orders placed or performed in visit on 10/29/22  Microalbumin, Urine Waived  Result Value Ref Range   Microalb, Ur Waived 30 (H) 0 - 19 mg/L   Creatinine, Urine Waived 10 10 - 300 mg/dL   Microalb/Creat Ratio >300 (H) <30 mg/g  Magnesium  Result Value Ref Range   Magnesium 2.4 (H) 1.6 - 2.3 mg/dL  PSA  Result Value Ref Range   Prostate Specific Ag, Serum 0.2 0.0 - 4.0 ng/mL  TSH  Result Value Ref Range   TSH 2.640 0.450 - 4.500 uIU/mL  VITAMIN D 25 Hydroxy (Vit-D Deficiency, Fractures)  Result Value Ref Range   Vit D, 25-Hydroxy 17.5 (L) 30.0 - 100.0 ng/mL  Vitamin B12  Result Value Ref Range   Vitamin B-12 498 232 - 1,245 pg/mL  Hepatitis C antibody  Result Value Ref Range   Hep C Virus Ab Non Reactive Non Reactive  Hepatic function panel  Result Value Ref Range   Total Protein 7.1 6.0 - 8.5 g/dL   Albumin 4.3 3.8 - 4.9 g/dL   Bilirubin Total 0.2 0.0 - 1.2 mg/dL   Bilirubin, Direct <1.61 0.00 - 0.40 mg/dL   Alkaline Phosphatase 173 (H) 44 - 121 IU/L   AST 26 0 - 40 IU/L   ALT 25 0 - 44 IU/L  CBC with Differential/Platelet  Result Value Ref Range   WBC 5.5 3.4 - 10.8 x10E3/uL   RBC 5.09 4.14 - 5.80 x10E6/uL   Hemoglobin 13.0 13.0 - 17.7 g/dL   Hematocrit 09.6 04.5 - 51.0 %   MCV 80 79 - 97 fL   MCH 25.5 (L) 26.6 - 33.0 pg   MCHC 31.8 31.5 -  35.7 g/dL   RDW 40.9 81.1 - 91.4 %   Platelets 266 150 - 450 x10E3/uL   Neutrophils 57 Not Estab. %   Lymphs 27 Not Estab. %   Monocytes 9 Not Estab. %   Eos 5 Not Estab. %   Basos 1 Not Estab. %   Neutrophils Absolute 3.2 1.4 - 7.0 x10E3/uL   Lymphocytes Absolute 1.5 0.7 - 3.1 x10E3/uL   Monocytes Absolute 0.5 0.1 -  0.9 x10E3/uL   EOS (ABSOLUTE) 0.3 0.0 - 0.4 x10E3/uL   Basophils Absolute 0.0 0.0 - 0.2 x10E3/uL   Immature Granulocytes 1 Not Estab. %   Immature Grans (Abs) 0.0 0.0 - 0.1 x10E3/uL      Assessment & Plan:   Problem List Items Addressed This Visit       Other   Cough - Primary    Acute for 24 hours with fever and other URI symptoms, unable to get to office for Covid testing.  His partner will test at home and notify PCP of results.  If positive testing we discussed initiation of Paxlovid, would hold statin during treatment.  He is agreeable to this as has history of hospitalization with Covid.  At this time symptoms present only for 24 hours, will not start abx therapy -- plan on sending in on Friday is symptoms ongoing (Zpack), discussed at length with patient.  Tussionex script sent.  Can not take Prednisone due to poorly controlled diabetes.  Recommend: - Increased rest - Increasing Fluids - Acetaminophen / ibuprofen as needed for fever/pain.  - Salt water gargling, chloraseptic spray and throat lozenges - Mucinex.  - Humidifying the air.  Return in one week for cough.       I discussed the assessment and treatment plan with the patient. The patient was provided an opportunity to ask questions and all were answered. The patient agreed with the plan and demonstrated an understanding of the instructions.   The patient was advised to call back or seek an in-person evaluation if the symptoms worsen or if the condition fails to improve as anticipated.   I provided 21+ minutes of time during this encounter.    Follow up plan: Return in about 1 week (around  12/08/2022) for Cough.

## 2022-12-09 DIAGNOSIS — Z419 Encounter for procedure for purposes other than remedying health state, unspecified: Secondary | ICD-10-CM | POA: Diagnosis not present

## 2022-12-11 ENCOUNTER — Telehealth: Payer: Self-pay

## 2022-12-11 NOTE — Progress Notes (Signed)
   Care Guide Note  12/11/2022 Name: BEVERLY BOSIO MRN: [id number] DOB: Aug 15, 1970  Referred by: Marjie Skiff, NP Reason for referral : No chief complaint on file.   FLORENCE MUSIC is a 52 y.o. year old male who is a primary care patient of Cannady, Dorie Rank, NP. Mellody Drown was referred to the pharmacist for assistance related to DM.    Successful contact was made with the patient to discuss pharmacy services. Patient declines engagement at this time. Contact information was provided to the patient should they wish to reach out for assistance at a later time.  Penne Lash, RMA Care Guide [facility]  [place], [place] [address] Direct Dial: [phone] [email or web address]

## 2022-12-13 NOTE — Patient Instructions (Signed)
Cough, Adult A cough helps to clear your throat and lungs. It may be a sign of an illness or another condition. A short-term (acute) cough may last 2-3 weeks. A long-term (chronic) cough may last 8 or more weeks. Many things can cause a cough. They include: Illnesses such as: An infection in your throat or lungs. Asthma or other heart or lung problems. Gastroesophageal reflux. This is when acid comes back up from your stomach. Breathing in things that bother (irritate) your lungs. Allergies. Postnasal drip. This is when mucus runs down the back of your throat. Smoking. Some medicines. Follow these instructions at home: Medicines Take over-the-counter and prescription medicines only as told by your doctor. Talk with your doctor before you take cough medicine (cough suppressants). Eating and drinking Do not drink alcohol. Do not drink caffeine. Drink enough fluid to keep your pee (urine) pale yellow. Lifestyle Stay away from cigarette smoke. Do not smoke or use any products that contain nicotine or tobacco. If you need help quitting, ask your doctor. Stay away from things that make you cough. These may include perfume, candles, cleaning products, or campfire smoke. General instructions  Watch for any changes to your cough. Tell your doctor about them. Always cover your mouth when you cough. If the air is dry in your home, use a cool mist vaporizer or humidifier. If your cough is worse at night, try using extra pillows to raise your head up higher while you sleep. Rest as needed. Contact a doctor if: You have new symptoms. Your symptoms get worse. You cough up pus. You have a fever that does not go away. Your cough does not get better after 2-3 weeks. Cough medicine does not help, and you are not sleeping well. You have pain that gets worse or is not helped with medicine. You are losing weight and do not know why. You have night sweats. Get help right away if: You cough up  blood. You have trouble breathing. Your heart is beating very fast. These symptoms may be an emergency. Get help right away. Call 911. Do not wait to see if the symptoms will go away. Do not drive yourself to the hospital. This information is not intended to replace advice given to you by your health care provider. Make sure you discuss any questions you have with your health care provider. Document Revised: 03/27/2022 Document Reviewed: 03/27/2022 Elsevier Patient Education  2023 Elsevier Inc.  

## 2022-12-15 ENCOUNTER — Encounter: Payer: Self-pay | Admitting: Nurse Practitioner

## 2022-12-15 ENCOUNTER — Telehealth: Payer: Self-pay | Admitting: Nurse Practitioner

## 2022-12-15 ENCOUNTER — Ambulatory Visit (INDEPENDENT_AMBULATORY_CARE_PROVIDER_SITE_OTHER): Payer: Medicare Other | Admitting: Nurse Practitioner

## 2022-12-15 VITALS — BP 135/73 | HR 77 | Temp 98.5°F | Ht 67.09 in | Wt 239.5 lb

## 2022-12-15 DIAGNOSIS — U071 COVID-19: Secondary | ICD-10-CM | POA: Diagnosis not present

## 2022-12-15 NOTE — Progress Notes (Signed)
BP 135/73   Pulse 77   Temp 98.5 F (36.9 C) (Oral)   Ht 5' 7.09" (1.704 m)   Wt 239 lb 8 oz (108.6 kg)   SpO2 99%   BMI 37.41 kg/m    Subjective:    Patient ID: Mike Kline, male    DOB: 03-22-1971, 52 y.o.   MRN: [id number]  HPI: Mike Kline is a 52 y.o. male  Chief Complaint  Patient presents with   Cough   COVID POSITIVE Was treated for Covid on 12/01/22 -- treated with Paxlovid and overall improved.   Fever: no Cough: no Shortness of breath: no Wheezing: no Chest pain: no Chest tightness: no Chest congestion: no Nasal congestion: tiny bit Runny nose: tiny bit Post nasal drip: tiny bit Sneezing: no Sore throat: no Swollen glands: no Sinus pressure: no Headache: no Face pain: no Toothache: no Ear pain: none Ear pressure: none Eyes red/itching:no Eye drainage/crusting: no  Vomiting: no Rash: no Fatigue: no Context: better Relief with OTC cold/cough medications: Paxlovid  Treatments attempted: Paxlovid    Relevant past medical, surgical, family and social history reviewed and updated as indicated. Interim medical history since our last visit reviewed. Allergies and medications reviewed and updated.  Review of Systems  Constitutional:  Negative for activity change, diaphoresis, fatigue and fever.  Respiratory:  Negative for cough, chest tightness, shortness of breath and wheezing.   Cardiovascular:  Negative for chest pain, palpitations and leg swelling.  Gastrointestinal: Negative.   Neurological: Negative.   Psychiatric/Behavioral: Negative.     Per HPI unless specifically indicated above     Objective:    BP 135/73   Pulse 77   Temp 98.5 F (36.9 C) (Oral)   Ht 5' 7.09" (1.704 m)   Wt 239 lb 8 oz (108.6 kg)   SpO2 99%   BMI 37.41 kg/m   Wt Readings from Last 3 Encounters:  12/15/22 239 lb 8 oz (108.6 kg)  12/01/22 240 lb (108.9 kg)  10/29/22 250 lb 9.6 oz (113.7 kg)    Physical Exam Vitals and nursing note reviewed.   Constitutional:      General: He is awake. He is not in acute distress.    Appearance: He is well-developed and well-groomed. He is obese. He is not ill-appearing or toxic-appearing.  HENT:     Head: Normocephalic.     Right Ear: Hearing, tympanic membrane, ear canal and external ear normal.     Left Ear: Hearing, tympanic membrane, ear canal and external ear normal.     Nose: Nose normal.     Right Sinus: No maxillary sinus tenderness or frontal sinus tenderness.     Left Sinus: No maxillary sinus tenderness or frontal sinus tenderness.     Mouth/Throat:     Mouth: Mucous membranes are moist.     Pharynx: No pharyngeal swelling, oropharyngeal exudate or posterior oropharyngeal erythema.  Eyes:     General: Lids are normal.     Extraocular Movements: Extraocular movements intact.     Conjunctiva/sclera: Conjunctivae normal.  Neck:     Thyroid: No thyromegaly.     Vascular: No carotid bruit.  Cardiovascular:     Rate and Rhythm: Normal rate and regular rhythm.     Heart sounds: Normal heart sounds. No murmur heard.    No gallop.  Pulmonary:     Effort: No accessory muscle usage or respiratory distress.     Breath sounds: Normal breath sounds.  Abdominal:  General: Bowel sounds are normal. There is no distension.     Palpations: Abdomen is soft.     Tenderness: There is no abdominal tenderness.  Musculoskeletal:     Cervical back: Full passive range of motion without pain.     Right lower leg: No edema.     Left lower leg: No edema.  Lymphadenopathy:     Cervical: No cervical adenopathy.  Skin:    General: Skin is warm.     Capillary Refill: Capillary refill takes less than 2 seconds.  Neurological:     Mental Status: He is alert and oriented to person, place, and time.     Deep Tendon Reflexes: Reflexes are normal and symmetric.     Reflex Scores:      Brachioradialis reflexes are 2+ on the right side and 2+ on the left side.      Patellar reflexes are 2+ on the right  side and 2+ on the left side. Psychiatric:        Attention and Perception: Attention normal.        Mood and Affect: Mood normal.        Speech: Speech normal.        Behavior: Behavior normal. Behavior is cooperative.        Thought Content: Thought content normal.    Results for orders placed or performed in visit on 10/29/22  Microalbumin, Urine Waived  Result Value Ref Range   Microalb, Ur Waived 30 (H) 0 - 19 mg/L   Creatinine, Urine Waived 10 10 - 300 mg/dL   Microalb/Creat Ratio >300 (H) <30 mg/g  Magnesium  Result Value Ref Range   Magnesium 2.4 (H) 1.6 - 2.3 mg/dL  PSA  Result Value Ref Range   Prostate Specific Ag, Serum 0.2 0.0 - 4.0 ng/mL  TSH  Result Value Ref Range   TSH 2.640 0.450 - 4.500 uIU/mL  VITAMIN D 25 Hydroxy (Vit-D Deficiency, Fractures)  Result Value Ref Range   Vit D, 25-Hydroxy 17.5 (L) 30.0 - 100.0 ng/mL  Vitamin B12  Result Value Ref Range   Vitamin B-12 498 232 - 1,245 pg/mL  Hepatitis C antibody  Result Value Ref Range   Hep C Virus Ab Non Reactive Non Reactive  Hepatic function panel  Result Value Ref Range   Total Protein 7.1 6.0 - 8.5 g/dL   Albumin 4.3 3.8 - 4.9 g/dL   Bilirubin Total 0.2 0.0 - 1.2 mg/dL   Bilirubin, Direct <9.62 0.00 - 0.40 mg/dL   Alkaline Phosphatase 173 (H) 44 - 121 IU/L   AST 26 0 - 40 IU/L   ALT 25 0 - 44 IU/L  CBC with Differential/Platelet  Result Value Ref Range   WBC 5.5 3.4 - 10.8 x10E3/uL   RBC 5.09 4.14 - 5.80 x10E6/uL   Hemoglobin 13.0 13.0 - 17.7 g/dL   Hematocrit 95.2 84.1 - 51.0 %   MCV 80 79 - 97 fL   MCH 25.5 (L) 26.6 - 33.0 pg   MCHC 31.8 31.5 - 35.7 g/dL   RDW 32.4 40.1 - 02.7 %   Platelets 266 150 - 450 x10E3/uL   Neutrophils 57 Not Estab. %   Lymphs 27 Not Estab. %   Monocytes 9 Not Estab. %   Eos 5 Not Estab. %   Basos 1 Not Estab. %   Neutrophils Absolute 3.2 1.4 - 7.0 x10E3/uL   Lymphocytes Absolute 1.5 0.7 - 3.1 x10E3/uL   Monocytes Absolute 0.5 0.1 - 0.9  x10E3/uL   EOS  (ABSOLUTE) 0.3 0.0 - 0.4 x10E3/uL   Basophils Absolute 0.0 0.0 - 0.2 x10E3/uL   Immature Granulocytes 1 Not Estab. %   Immature Grans (Abs) 0.0 0.0 - 0.1 x10E3/uL      Assessment & Plan:   Problem List Items Addressed This Visit       Other   COVID - Primary    Acute and improved at this time with Paxlovid -- no further needs.          Follow up plan: Return for as scheduled 12/24/22.

## 2022-12-15 NOTE — Assessment & Plan Note (Signed)
Acute and improved at this time with Paxlovid -- no further needs.

## 2022-12-15 NOTE — Telephone Encounter (Signed)
Patient was just seen by provider. Paper work was completed when patient went to tax office to process. They stated everything look good but MD need to sign form instead of DO. Placed in provider folder. Contact patient as soon as completed.

## 2022-12-16 NOTE — Telephone Encounter (Signed)
Called patient and informed him that the documents are being held at our office until [place] with the tax office returns on Monday May 13th to confirm that Dr. Henriette Combs signature will be acceptable.  Patient acknowledged understanding.

## 2022-12-16 NOTE — Telephone Encounter (Signed)
That's illegal. 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 Mike Kline. This will need to be escalated.

## 2022-12-16 NOTE — Telephone Encounter (Signed)
Contacted tax office and spoke with Tammy regarding Dr. Henriette Combs signature on form. Tammy stated that the tax office did not know what a DO was. I was transferred to [facility] and provided education regarding the practice of a DO. Ms. Judithann Sheen apologized for the misunderstanding and stated that she has always been told that only an MD can sign the form and although she now understands that a DO is a doctor, she will have to wait until her supervisor Romeo Apple) returns from vacation on Monday, May 13th to know if the signature can be accepted. Babette Relic stated that I would need to speak with [facility] to provide information provided clarification to Tammy that a DO is a physician and therefore informed Mr. Brito that the signature could not be accepted. I agreed to hold on to documents until Ms. Alcon is able to speak with her supervisor on Monday.

## 2022-12-16 NOTE — Telephone Encounter (Signed)
Called and spoke with Romeo Apple at the [employer] office at [phone], and he informed me that by [employer] statue all certifications of disability for the county tax assessors office has to be signed by a "MD" and not a "DO"

## 2022-12-16 NOTE — Telephone Encounter (Signed)
If this is the actual law, I will need the statute because I will be escalating this to both my state and national societies.

## 2022-12-17 ENCOUNTER — Ambulatory Visit (INDEPENDENT_AMBULATORY_CARE_PROVIDER_SITE_OTHER): Payer: Medicare Other | Admitting: Nurse Practitioner

## 2022-12-17 ENCOUNTER — Ambulatory Visit (INDEPENDENT_AMBULATORY_CARE_PROVIDER_SITE_OTHER): Payer: Medicare Other

## 2022-12-17 ENCOUNTER — Encounter (INDEPENDENT_AMBULATORY_CARE_PROVIDER_SITE_OTHER): Payer: Self-pay | Admitting: Nurse Practitioner

## 2022-12-17 VITALS — BP 150/73 | HR 91 | Resp 16 | Wt 239.4 lb

## 2022-12-17 DIAGNOSIS — R609 Edema, unspecified: Secondary | ICD-10-CM

## 2022-12-17 DIAGNOSIS — I890 Lymphedema, not elsewhere classified: Secondary | ICD-10-CM | POA: Diagnosis not present

## 2022-12-17 DIAGNOSIS — E103553 Type 1 diabetes mellitus with stable proliferative diabetic retinopathy, bilateral: Secondary | ICD-10-CM | POA: Diagnosis not present

## 2022-12-17 DIAGNOSIS — R0989 Other specified symptoms and signs involving the circulatory and respiratory systems: Secondary | ICD-10-CM

## 2022-12-17 DIAGNOSIS — I739 Peripheral vascular disease, unspecified: Secondary | ICD-10-CM | POA: Diagnosis not present

## 2022-12-18 ENCOUNTER — Encounter: Payer: Self-pay | Admitting: Nurse Practitioner

## 2022-12-21 MED ORDER — AMOXICILLIN-POT CLAVULANATE 875-125 MG PO TABS: 1.0000 | ORAL_TABLET | Freq: Two times a day (BID) | ORAL | 0 refills | Status: AC

## 2022-12-21 MED ORDER — HYDROCOD POLI-CHLORPHE POLI ER 10-8 MG/5ML PO SUER: 5.0000 mL | Freq: Two times a day (BID) | ORAL | 0 refills | Status: DC | PRN

## 2022-12-22 NOTE — Telephone Encounter (Signed)
Contacted patient and informed him that the tax office can accept the forms with Dr. Henriette Combs signature.  Patient acknowledged understanding and agreed to come by office to pick up forms.

## 2022-12-23 LAB — VAS US ABI WITH/WO TBI
Left ABI: >1
Right ABI: >1

## 2022-12-24 ENCOUNTER — Ambulatory Visit: Payer: Medicare Other | Admitting: Nurse Practitioner

## 2023-01-05 ENCOUNTER — Encounter (INDEPENDENT_AMBULATORY_CARE_PROVIDER_SITE_OTHER): Payer: Self-pay | Admitting: Nurse Practitioner

## 2023-01-05 NOTE — Progress Notes (Signed)
Subjective:    Patient ID: Mike Kline, male    DOB: 12/19/1970, 53 y.o.   MRN: [id number] Chief Complaint  Patient presents with   Follow-up    Ref Mecum consult diminished pulses edema    Mike Kline is a 52 year old male who returns today for follow-up evaluation of his lower extremity edema.  We have previously saw the patient in 2022 he was noted to have some mild venous insufficiency.  Since that time he has continued to have some worsening issues of swelling.  He notes that he does not wear compression socks significantly as sometimes there are some difficulty with mobility.  He denies any current open wounds or ulcerations.  He has evidence of stasis dermatitis bilaterally.  Any claudication-like symptoms.  He currently has no evidence of rest pain or ulcerations.  Today he has no evidence of DVT or superficial thrombophlebitis bilaterally.  No evidence of superficial venous reflux or deep venous insufficiency bilaterally.  Noncompressible ABIs bilaterally.  He has biphasic tibial artery waveforms bilaterally with normal toe waveforms on the left slightly dampened on the right    Review of Systems     Objective:   Physical Exam  BP (!) 150/73 (BP Location: Right Arm)   Pulse 91   Resp 16   Wt 239 lb 6.4 oz (108.6 kg)   BMI 37.40 kg/m   Past Medical History:  Diagnosis Date   Adhesive capsulitis    permanent disability 07/2010, left shoulder   Anginal pain (HCC)    Blindness of left eye    Diabetes mellitus without complication (HCC)    Diabetic, retinopathy, proliferative (HCC)    GERD (gastroesophageal reflux disease)    Hyperlipidemia    Hypertension     Social History   Socioeconomic History   Marital status: Divorced    Spouse name: Not on file   Number of children: Not on file   Years of education: Not on file   Highest education level: Some college, no degree  Occupational History   Occupation: disability  Tobacco Use   Smoking status: Never    Smokeless tobacco: Never   Tobacco comments:    Never  Vaping Use   Vaping Use: Never used  Substance and Sexual Activity   Alcohol use: Not Currently    Comment: socially   Drug use: No   Sexual activity: Not on file  Other Topics Concern   Not on file  Social History Narrative   Not on file   Social Determinants of Health   Financial Resource Strain: Medium Risk (12/14/2022)   Overall Financial Resource Strain (CARDIA)    Difficulty of Paying Living Expenses: Somewhat hard  Food Insecurity: Food Insecurity Present (12/14/2022)   Hunger Vital Sign    Worried About Running Out of Food in the Last Year: Sometimes true    Ran Out of Food in the Last Year: Sometimes true  Transportation Needs: No Transportation Needs (12/14/2022)   PRAPARE - Administrator, Civil Service (Medical): No    Lack of Transportation (Non-Medical): No  Physical Activity: Insufficiently Active (12/14/2022)   Exercise Vital Sign    Days of Exercise per Week: 1 day    Minutes of Exercise per Session: 10 min  Stress: Stress Concern Present (12/14/2022)   [employer] of Occupational Health - Occupational Stress Questionnaire    Feeling of Stress : To some extent  Social Connections: Socially Isolated (12/14/2022)   Social Connection and Isolation Panel [  NHANES]    Frequency of Communication with Friends and Family: Twice a week    Frequency of Social Gatherings with Friends and Family: Never    Attends Religious Services: 1 to 4 times per year    Active Member of [employer] or Organizations: No    Attends Engineer, structural: Not on file    Marital Status: Divorced  Intimate Partner Violence: Not on file    Past Surgical History:  Procedure Laterality Date   APPENDECTOMY     CABG x4  06/02/2019      CARPAL TUNNEL RELEASE Bilateral    cataract surgery Bilateral 2014   EYE SURGERY Left    LAA clip Left 06/02/2019   [facility]   LEFT HEART CATH AND CORONARY ANGIOGRAPHY Left  05/22/2019   Procedure: LEFT HEART CATH AND CORONARY ANGIOGRAPHY;  Surgeon: Alwyn Pea, MD;  Location: [facility] INVASIVE CV [facility];  Service: Cardiovascular;  Laterality: Left;   SHOULDER SURGERY Left 2009    Family History  Problem Relation Age of Onset   Bone cancer Mother     No Known Allergies     Latest Ref Rng & Units 10/29/2022   10:55 AM 07/24/2022   10:56 AM 11/15/2021   11:29 AM  CBC  WBC 3.4 - 10.8 x10E3/uL 5.5  6.0  6.6   Hemoglobin 13.0 - 17.7 g/dL 96.2  95.2  84.1   Hematocrit 37.5 - 51.0 % 40.9  39.9  37.0   Platelets 150 - 450 x10E3/uL 266  314  274       CMP     Component Value Date/Time   NA 139 07/24/2022 1056   K 4.2 07/24/2022 1056   CL 99 07/24/2022 1056   CO2 24 07/24/2022 1056   GLUCOSE 82 07/24/2022 1056   GLUCOSE 361 (H) 11/15/2021 1129   BUN 17 07/24/2022 1056   CREATININE 1.40 (H) 07/24/2022 1056   CALCIUM 9.5 07/24/2022 1056   PROT 7.1 10/29/2022 1055   ALBUMIN 4.3 10/29/2022 1055   AST 26 10/29/2022 1055   AST 31 12/15/2016 1418   ALT 25 10/29/2022 1055   ALT 35 12/15/2016 1418   ALKPHOS 173 (H) 10/29/2022 1055   BILITOT 0.2 10/29/2022 1055   GFRNONAA >60 11/15/2021 1129   GFRAA 80 04/14/2019 1141     VAS [place] ABI WITH/WO TBI  Result Date: 12/23/2022  LOWER EXTREMITY DOPPLER STUDY Patient Name:  Mike Kline  Date of Exam:   12/17/2022 Medical Rec #: [id number]      Accession #:    [id number] Date of Birth: [birth year]       Patient Gender: M Patient Age:   29 years Exam Location:  [facility] Procedure:      VAS [place] ABI WITH/WO TBI Referring Phys: Levora Dredge --------------------------------------------------------------------------------  Indications: Peripheral artery disease, and Lymph edema.  Comparison Study: 03/26/2021 Performing Technologist: Debbe Bales RVS  Examination Guidelines: A complete evaluation includes at minimum, Doppler waveform signals and systolic blood pressure reading at the level of bilateral  brachial, anterior tibial, and posterior tibial arteries, when vessel segments are accessible. Bilateral testing is considered an integral part of a complete examination. Photoelectric Plethysmograph (PPG) waveforms and toe systolic pressure readings are included as required and additional duplex testing as needed. Limited examinations for reoccurring indications may be performed as noted.  ABI Findings: +---------+------------------+-----+--------+--------+ Right    Rt Pressure (mmHg)IndexWaveformComment  +---------+------------------+-----+--------+--------+ Brachial 142                                     +---------+------------------+-----+--------+--------+  ATA      210               1.47 biphasicNC       +---------+------------------+-----+--------+--------+ PTA      214               1.50 biphasicNC       +---------+------------------+-----+--------+--------+ [facility]                0.61 Abnormal         +---------+------------------+-----+--------+--------+ +---------+------------------+-----+--------+-------+ Left     Lt Pressure (mmHg)IndexWaveformComment +---------+------------------+-----+--------+-------+ Brachial 143                                    +---------+------------------+-----+--------+-------+ ATA      225               1.57 biphasicNC      +---------+------------------+-----+--------+-------+ PTA      223               1.56 biphasicNC      +---------+------------------+-----+--------+-------+ Ernst Spell               0.86 Normal          +---------+------------------+-----+--------+-------+ +-------+-----------+-----------+------------+------------+ ABI/TBIToday's ABIToday's TBIPrevious ABIPrevious TBI +-------+-----------+-----------+------------+------------+ Right  >1.0 [place]    .61        1.17        1.10         +-------+-----------+-----------+------------+------------+ Left   >1.0 [place]    .86        1.20         .77          +-------+-----------+-----------+------------+------------+ Bilateral ABIs appear essentially unchanged compared to prior study on 03/26/2021. Right TBIs appear decreased compared to prior study on 03/26/2021. Lt TBIs appear to be increased compared to prior study on 03/26/2021.  Summary: Right: Resting right ankle-brachial index indicates noncompressible right lower extremity arteries. The right toe-brachial index is abnormal. Left: Resting left ankle-brachial index indicates noncompressible left lower extremity arteries. The left toe-brachial index is normal. *See table(s) above for measurements and observations.  Electronically signed by Festus Barren MD on 12/23/2022 at 7:52:42 AM.    Final        Assessment & Plan:   1. PVD (peripheral vascular disease) (HCC) The patient has minor evidence of peripheral vascular disease.  Given his history of type 1 diabetes as well as other risk factors we will follow this annually.  2. 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  Hgb A1C to be monitored as already arranged by primary service  3. Lymphedema Recommend:  I have had a long discussion with the patient regarding swelling and why it  causes symptoms.  Patient will begin wearing graduated compression on a daily basis a prescription was given. The patient will  wear the stockings first thing in the morning and removing them in the evening. The patient is instructed specifically not to sleep in the stockings.   In addition, behavioral modification will be initiated.  This will include frequent elevation, use of over the counter pain medications and exercise such as walking.  Consideration for a lymph pump will also be made based upon the effectiveness of conservative therapy.  This would help to improve the edema control  and prevent sequela such as ulcers and infections   The patient will follow-up in 6 months or sooner if issues arise.   Current Outpatient Medications on File Prior to Visit  Medication Sig Dispense Refill   albuterol (VENTOLIN HFA) 108 (90 Base) MCG/ACT inhaler Inhale 2 puffs into the lungs every 6 (six) hours as needed for wheezing or shortness of breath (Cough). 18 g 4   amitriptyline (ELAVIL) 100 MG tablet Take 1 tablet (100 mg total) by mouth at bedtime. 90 tablet 2   Continuous Blood Gluc Sensor (FREESTYLE LIBRE 2 SENSOR) MISC PLACE 1 TO THE BACK OF YOUR UPPER ARM, REPLACE EVERY 14 DAYS 2 each 0   gabapentin (NEURONTIN) 300 MG capsule Take 1 capsule (300 mg total) by mouth 3 (three) times daily. 90 capsule 2   HUMALOG KWIKPEN 100 UNIT/ML KiwkPen USE THREE TIMES A DAY AS DIRECTED.(UP TO 50 UNITS A DAY OR AS DIRECTED BY SLIDING SCALE) (Patient taking differently: Inject 6-10 Units into the skin 3 (three) times daily.) 15 mL 6   insulin glargine (LANTUS SOLOSTAR) 100 UNIT/ML Solostar Pen Inject 40 Units into the skin daily at 10 pm.     losartan (COZAAR) 50 MG tablet Take 1 tablet (50 mg total) by mouth daily. 90 tablet 2   metoprolol tartrate (LOPRESSOR) 50 MG tablet Take 50 mg by mouth 2 (two) times daily.     omeprazole (PRILOSEC) 40 MG capsule Take 1 capsule (40 mg total) by mouth daily. 90 capsule 3   potassium chloride SA (KLOR-CON M) 20 MEQ tablet Take 20 mEq by mouth 2 (two) times daily.     rosuvastatin (CRESTOR) 40 MG tablet TAKE ONE TABLET BY MOUTH ONE TIME DAILY 90 tablet 4   sildenafil (VIAGRA) 50 MG tablet Take 50 mg by mouth daily as needed.     torsemide (DEMADEX) 20 MG tablet Take 2 tablets (40 mg total) by mouth 2 (two) times daily. 360 tablet 4   triamcinolone cream (KENALOG) 0.1 % Apply 1 Application topically 2 (two) times daily. 30 g 3   No current facility-administered medications on file prior to visit.    There are no Patient Instructions on file for this  visit. No follow-ups on file.   Georgiana Spinner, NP

## 2023-01-09 DIAGNOSIS — Z419 Encounter for procedure for purposes other than remedying health state, unspecified: Secondary | ICD-10-CM | POA: Diagnosis not present

## 2023-01-13 ENCOUNTER — Encounter: Payer: Self-pay | Admitting: Nurse Practitioner

## 2023-01-20 ENCOUNTER — Other Ambulatory Visit: Payer: Self-pay | Admitting: Nurse Practitioner

## 2023-01-20 DIAGNOSIS — E1042 Type 1 diabetes mellitus with diabetic polyneuropathy: Secondary | ICD-10-CM

## 2023-01-21 NOTE — Telephone Encounter (Signed)
Requested Prescriptions  Pending Prescriptions Disp Refills   gabapentin (NEURONTIN) 300 MG capsule [Pharmacy Med Name: GABAPENTIN 300 MG CAP[*]] 270 capsule 0    Sig: TAKE ONE CAPSULE BY MOUTH THREE TIMES A DAY     Neurology: Anticonvulsants - gabapentin Failed - 01/20/2023  2:15 PM      Failed - Cr in normal range and within 360 days    Creatinine, Ser  Date Value Ref Range Status  07/24/2022 1.40 (H) 0.76 - 1.27 mg/dL Final         Passed - Completed PHQ-2 or PHQ-9 in the last 360 days      Passed - Valid encounter within last 12 months    Recent Outpatient Visits           1 month ago COVID   [place] [facility] [place], [place] T, NP   1 month ago Acute cough   [place] [facility] [place], [place] T, NP   2 months ago Medicare annual wellness visit, subsequent   [place] [facility] [place], [place] T, NP   6 months ago DM type 1 with diabetic peripheral neuropathy (HCC)   [facility] Mecum, Erin E, PA-C   9 months ago Primary hypertension   [place] [facility] Mecum, Oswaldo Conroy, PA-C

## 2023-02-02 DIAGNOSIS — E1022 Type 1 diabetes mellitus with diabetic chronic kidney disease: Secondary | ICD-10-CM | POA: Diagnosis not present

## 2023-02-02 DIAGNOSIS — E1029 Type 1 diabetes mellitus with other diabetic kidney complication: Secondary | ICD-10-CM | POA: Diagnosis not present

## 2023-02-02 DIAGNOSIS — N1831 Chronic kidney disease, stage 3a: Secondary | ICD-10-CM | POA: Diagnosis not present

## 2023-02-02 DIAGNOSIS — E1042 Type 1 diabetes mellitus with diabetic polyneuropathy: Secondary | ICD-10-CM | POA: Diagnosis not present

## 2023-02-02 DIAGNOSIS — E1059 Type 1 diabetes mellitus with other circulatory complications: Secondary | ICD-10-CM | POA: Diagnosis not present

## 2023-02-02 DIAGNOSIS — I10 Essential (primary) hypertension: Secondary | ICD-10-CM | POA: Diagnosis not present

## 2023-02-02 DIAGNOSIS — E1069 Type 1 diabetes mellitus with other specified complication: Secondary | ICD-10-CM | POA: Diagnosis not present

## 2023-02-02 DIAGNOSIS — R809 Proteinuria, unspecified: Secondary | ICD-10-CM | POA: Diagnosis not present

## 2023-02-02 DIAGNOSIS — E1065 Type 1 diabetes mellitus with hyperglycemia: Secondary | ICD-10-CM | POA: Diagnosis not present

## 2023-02-02 DIAGNOSIS — E103553 Type 1 diabetes mellitus with stable proliferative diabetic retinopathy, bilateral: Secondary | ICD-10-CM | POA: Diagnosis not present

## 2023-02-04 ENCOUNTER — Encounter: Payer: Self-pay | Admitting: Nurse Practitioner

## 2023-02-05 ENCOUNTER — Ambulatory Visit: Payer: Self-pay

## 2023-02-05 NOTE — Telephone Encounter (Signed)
See my chart message

## 2023-02-05 NOTE — Telephone Encounter (Signed)
Chief Complaint: Back Pian Symptoms: lower chronic back pain  Frequency: ongoing  Pertinent Negatives: Patient denies all other symptoms.  Disposition: [] ED /[] Urgent Care (no appt availability in office) / [] Appointment(In office/virtual)/ []  [place] [facility]/ [] Home Care/ [x] Refused Recommended Disposition /[] [place] [facility]/ []  Follow-up with PCP Additional Notes: Patient and significant other called reporting ongoing chronic lower back pain. Patient states that his pain level is always at a level 10 but feels like the pain is worse than usual today. Patient did report having a fall recently but does not associate the fall with the back pain. He states the back pain is always bad. Sometimes he can not move around for more than 10 minutes before he has to stop due to the pain. Advised patient to go to ED today for evaluation. Patient refused due to the possibility of a long wait and he has to move over the weekend. Offered patient first available in PCP office on Monday. Patient also declined. Patient would like pain medicine sent to pharmacy for treatment over the weekend or to speak with PCP as soon as possible. Advises that I would notify PCP office for more recommendations.   Reason for Disposition  Unable to walk  Answer Assessment - Initial Assessment Questions 1. ONSET: "When did the pain begin?"      Ongoing back pain  2. LOCATION: "Where does it hurt?" (upper, mid or lower back)     Lower back  3. SEVERITY: "How bad is the pain?"  (e.g., Scale 1-10; mild, moderate, or severe)   - MILD (1-3): Doesn't interfere with normal activities.    - MODERATE (4-7): Interferes with normal activities or awakens from sleep.    - SEVERE (8-10): Excruciating pain, unable to do any normal activities.      10, worse than usual over last 6 month. I feel like the fall made the pain worse. 4. PATTERN: "Is the pain constant?" (e.g., yes, no; constant, intermittent)      Constant, can walk  around for about 10 mins but the pain is always there.  5. RADIATION: "Does the pain shoot into your legs or somewhere else?"     No  6. CAUSE:  "What do you think is causing the back pain?"      Fall and chronic pain  7. BACK OVERUSE:  "Any recent lifting of heavy objects, strenuous work or exercise?"     No  8. MEDICINES: "What have you taken so far for the pain?" (e.g., nothing, acetaminophen, NSAIDS)     We have tried several different meds, BC, heating pad, tylenol, tens unit and Ice an heat.  9. NEUROLOGIC SYMPTOMS: "Do you have any weakness, numbness, or problems with bowel/bladder control?"     No  10. OTHER SYMPTOMS: "Do you have any other symptoms?" (e.g., fever, abdomen pain, burning with urination, blood in urine)       Leg pain , chest pain when back is really hurting, fatigue.  Protocols used: Back Pain-A-AH

## 2023-02-07 ENCOUNTER — Other Ambulatory Visit: Payer: Self-pay | Admitting: Nurse Practitioner

## 2023-02-07 ENCOUNTER — Telehealth: Payer: Self-pay | Admitting: Nurse Practitioner

## 2023-02-07 DIAGNOSIS — S80811A Abrasion, right lower leg, initial encounter: Secondary | ICD-10-CM | POA: Diagnosis not present

## 2023-02-07 DIAGNOSIS — M549 Dorsalgia, unspecified: Secondary | ICD-10-CM | POA: Diagnosis not present

## 2023-02-07 DIAGNOSIS — S81801A Unspecified open wound, right lower leg, initial encounter: Secondary | ICD-10-CM | POA: Diagnosis not present

## 2023-02-07 DIAGNOSIS — M545 Low back pain, unspecified: Secondary | ICD-10-CM | POA: Diagnosis not present

## 2023-02-07 DIAGNOSIS — E119 Type 2 diabetes mellitus without complications: Secondary | ICD-10-CM | POA: Diagnosis not present

## 2023-02-07 DIAGNOSIS — S81831A Puncture wound without foreign body, right lower leg, initial encounter: Secondary | ICD-10-CM | POA: Diagnosis not present

## 2023-02-07 NOTE — Telephone Encounter (Signed)
Heather, significant other, called at 0250 to nurse triage and per nurse "demanded to speak to physician". Heather spoke with Clinical research associate on telephone and wanted to alert them patient still having severe back pain, 10/10.  He hurt his back on Friday while moving items in home + obtained a large gash on leg going through a floorboard and had been advised to schedule visit or go to ER.  They have visit with PA, Denny Peon, in office on Monday. She reports he has tried Tramadol, Gabapentin, Tylenol, BC Powder, Ibuprofen, Flexeril, and other things without benefit. They have to be out of building tomorrow, are moving, and she is worried about him because he is in so much pain and this has gotten worse.  She reports they have no one else to help them move, it is only them.  States urgent care would not prescribe opioids so they did not attend there and states she called all the ERs locally ([facility], [facility], [facility], and [facility]) and that they all told her there is no guarantee that he would be seen within 12 hours.  She also reports he does not like going to hospital and knows if she tells him he will not go.   Discussed with  Herbert Seta concern that his back is worsening and the injury to leg, plus discussed with her he had tried Tramadol (opioid) without benefit and now with worsening pain.  Reiterated that provider needs an in-person visit with patient to fully assess and prescribe alternative, if they want something different/stronger then Tramadol. Offered her the option of provider calling [facility] ER (which they live closer to) and speaking to triage nurse to alert them of patient history and pain + injury to leg + assessing their current wait time.  Heather then placed patient on telephone and provider discussed this plan with him, which he was agreeable to.  He was agreeable to attending ER visit for further assessment. Provider called [facility] and spoke with triage nurse to alert them of patient  history and concerns with worsening back pain + leg injury.  They reported they will be looking out for patient and get them back right away.

## 2023-02-08 ENCOUNTER — Ambulatory Visit (INDEPENDENT_AMBULATORY_CARE_PROVIDER_SITE_OTHER): Payer: Medicare Other | Admitting: Physician Assistant

## 2023-02-08 ENCOUNTER — Encounter: Payer: Self-pay | Admitting: Physician Assistant

## 2023-02-08 VITALS — BP 133/85 | HR 118 | Temp 98.6°F | Wt 249.6 lb

## 2023-02-08 DIAGNOSIS — L03115 Cellulitis of right lower limb: Secondary | ICD-10-CM | POA: Diagnosis not present

## 2023-02-08 DIAGNOSIS — M545 Low back pain, unspecified: Secondary | ICD-10-CM | POA: Diagnosis not present

## 2023-02-08 DIAGNOSIS — G8929 Other chronic pain: Secondary | ICD-10-CM

## 2023-02-08 DIAGNOSIS — Z419 Encounter for procedure for purposes other than remedying health state, unspecified: Secondary | ICD-10-CM | POA: Diagnosis not present

## 2023-02-08 MED ORDER — KETOROLAC TROMETHAMINE 60 MG/2ML IM SOLN
60.0000 mg | Freq: Once | INTRAMUSCULAR | Status: AC
Start: 2023-02-08 — End: 2023-02-08
  Administered 2023-02-08: 60 mg via INTRAMUSCULAR

## 2023-02-08 NOTE — Progress Notes (Signed)
Acute Office Visit   Patient: Mike Kline   DOB: 18-Feb-1971   52 y.o. Male  MRN: [id number] Visit Date: 02/08/2023  Today's healthcare provider: Oswaldo Conroy Clay Menser, PA-C  Introduced myself to the patient as a Secondary school teacher and provided education on APPs in clinical practice.    Chief Complaint  Patient presents with   Back Pain    Pt states he has been having extreme back pain for the last couple of months. States it has progressively gotten worse since then. States the pain has gotten so bad that he ended up going to the ER  last night. States he was given a short supply of Oxycodone   Cellulitis    Pt states he was also seen at the ER for cellulitis last night. States he was put on Keflex and Mupirocin ointment.    Subjective    HPI HPI     Back Pain    Additional comments: Pt states he has been having extreme back pain for the last couple of months. States it has progressively gotten worse since then. States the pain has gotten so bad that he ended up going to the ER  last night. States he was given a short supply of Oxycodone        Cellulitis    Additional comments: Pt states he was also seen at the ER for cellulitis last night. States he was put on Keflex and Mupirocin ointment.       Last edited by Pablo Ledger, CMA on 02/08/2023  2:41 PM.      Patient is here with his girlfriend  Cellulitis He reports stepping through the floor to land on his buttocks Right leg is covered in superficial scratches and erythema from mid-shin down  There is  drainage and crusting to multiple areas He reports he has been taking his Keflex as directed  Tdap is UTD - last booster  was 10/29/22   Back Pain  Location: right side lower back- reports it is always uncomfortable  Pain level : right now it is 10/10  He states he has already taken half of his Oxycodone script  They report his back has been hurting for several weeks His girlfriend states that he has to stop what he is doing  after 5 minutes due to pain Interventions:BC, tylenol, TENS unit, biofreeze, massage,muscle relaxers    Patient asked his girlfriend to leave the exam room after his script for Oxycodone was brought up and they report that he has no pills remaining despite provider pointing out that he was given 10 pills which would have likely been sufficient for about 5 days He reports concerns that she has been taking his Oxycodone from his recent ED visit as he thinks he has only taken 4 pills total since he was seen We discussed that he will need to secure his medications in a lock box or safe and this provider will not be able to write further scripts while his girlfriend may have potential access    Reviewed ED visit notes from 02/07/23 - patient provided with Keflex, Mupirocin for cellulitis and Oxycodone for back pain   Medications: Outpatient Medications Prior to Visit  Medication Sig   albuterol (VENTOLIN HFA) 108 (90 Base) MCG/ACT inhaler Inhale 2 puffs into the lungs every 6 (six) hours as needed for wheezing or shortness of breath (Cough).   amitriptyline (ELAVIL) 100 MG tablet Take 1 tablet (100 mg total)  by mouth at bedtime.   cephALEXin (KEFLEX) 500 MG capsule Take 500 mg by mouth 4 (four) times daily.   cetirizine (ZYRTEC) 10 MG tablet Take 10 mg by mouth daily.   Continuous Blood Gluc Sensor (FREESTYLE LIBRE 2 SENSOR) MISC PLACE 1 TO THE BACK OF YOUR UPPER ARM, REPLACE EVERY 14 DAYS   gabapentin (NEURONTIN) 300 MG capsule TAKE ONE CAPSULE BY MOUTH THREE TIMES A DAY   HUMALOG KWIKPEN 100 UNIT/ML KiwkPen USE THREE TIMES A DAY AS DIRECTED.(UP TO 50 UNITS A DAY OR AS DIRECTED BY SLIDING SCALE) (Patient taking differently: Inject 6-10 Units into the skin 3 (three) times daily.)   insulin glargine (LANTUS SOLOSTAR) 100 UNIT/ML Solostar Pen Inject 40 Units into the skin daily at 10 pm. (Patient taking differently: Inject 50 Units into the skin daily at 10 pm.)   losartan (COZAAR) 50 MG tablet Take 1  tablet (50 mg total) by mouth daily.   melatonin 5 MG TABS Take 15 mg by mouth at bedtime as needed.   metoprolol tartrate (LOPRESSOR) 50 MG tablet Take 50 mg by mouth 2 (two) times daily.   mupirocin ointment (BACTROBAN) 2 % Apply 1 Application topically 3 (three) times daily.   omeprazole (PRILOSEC) 40 MG capsule Take 1 capsule (40 mg total) by mouth daily.   oxyCODONE (OXY IR/ROXICODONE) 5 MG immediate release tablet Take 5 mg by mouth every 4 (four) hours as needed.   potassium chloride SA (KLOR-CON M) 20 MEQ tablet Take 20 mEq by mouth 2 (two) times daily.   rosuvastatin (CRESTOR) 40 MG tablet TAKE ONE TABLET BY MOUTH ONE TIME DAILY   sildenafil (VIAGRA) 50 MG tablet Take 50 mg by mouth daily as needed.   torsemide (DEMADEX) 20 MG tablet Take 2 tablets (40 mg total) by mouth 2 (two) times daily.   triamcinolone cream (KENALOG) 0.1 % Apply 1 Application topically 2 (two) times daily.   No facility-administered medications prior to visit.    Review of Systems  Musculoskeletal:  Positive for back pain and myalgias.       Objective    BP 133/85 (BP Location: Right Arm, Cuff Size: Large)   Pulse (!) 118   Temp 98.6 F (37 C) (Oral)   Wt 249 lb 9.6 oz (113.2 kg)   SpO2 95%   BMI 38.99 kg/m    Physical Exam Vitals reviewed.  Constitutional:      General: He is awake.     Appearance: Normal appearance. He is well-developed and well-groomed.  HENT:     Head: Normocephalic and atraumatic.  Pulmonary:     Effort: Pulmonary effort is normal.  Skin:    General: Skin is warm and dry.     Findings: Erythema, laceration, rash and wound present.     Comments: Right lower extremity has multiple abrasions present with drainage and some areas of crusting  Skin is erythematous and there is some blistering present between abrasions   Neurological:     General: No focal deficit present.     Mental Status: He is alert and oriented to person, place, and time. Mental status is at  baseline.     GCS: GCS eye subscore is 4. GCS verbal subscore is 5. GCS motor subscore is 6.  Psychiatric:        Attention and Perception: Attention and perception normal.        Mood and Affect: Mood normal.        Speech: Speech normal.  Behavior: Behavior normal. Behavior is cooperative.       No results found for any visits on 02/08/23.  Assessment & Plan      Return in about 1 week (around 02/15/2023) for cellulitis recheck .       Problem List Items Addressed This Visit       Other   Chronic back pain - Primary    Chronic, recurrent issue He reports 10/10 pain on right lower back that has been ongoing for several weeks and has been getting worse He was seen in the ED yesterday and was provided with script for Oxycodone 5 mg  He reported concerns for his girlfriend taking his Oxycodone as he does not think he has taken more than 4 and she indicated in apt that they were all gone Given concerns for diversion- cannot provide new script today Will send in referral for PT and provide ketorolac 60 mg IM for pain management  Can continue to use home measures for further control  Follow up as needed for persistent or progressing symptoms        Relevant Medications   oxyCODONE (OXY IR/ROXICODONE) 5 MG immediate release tablet   Other Relevant Orders   Ambulatory referral to Physical Therapy   Other Visit Diagnoses     Cellulitis of right lower extremity     Acute, new concern Condition likely complicated by poorly controlled diabetes.  Tdap is up to date Reviewed ED visit notes - patient was provided with script for Keflex - recommend he continues wih this and Mupirocin ointment for management Reviewed ED and return precautions Recommend he follow up in one week to make sure this is healing well.          Return in about 1 week (around 02/15/2023) for cellulitis recheck .   I, Iwao Shamblin E Ahniya Mitchum, PA-C, have reviewed all documentation for this visit. The  documentation on 02/08/23 for the exam, diagnosis, procedures, and orders are all accurate and complete.   Jacquelin Hawking, MHS, PA-C [facility]

## 2023-02-08 NOTE — Telephone Encounter (Signed)
Pt already had an appointment scheduled for 02/08/2023 at 2:20 pm.

## 2023-02-08 NOTE — Assessment & Plan Note (Signed)
Chronic, recurrent issue He reports 10/10 pain on right lower back that has been ongoing for several weeks and has been getting worse He was seen in the ED yesterday and was provided with script for Oxycodone 5 mg  He reported concerns for his girlfriend taking his Oxycodone as he does not think he has taken more than 4 and she indicated in apt that they were all gone Given concerns for diversion- cannot provide new script today Will send in referral for PT and provide ketorolac 60 mg IM for pain management  Can continue to use home measures for further control  Follow up as needed for persistent or progressing symptoms

## 2023-02-10 DIAGNOSIS — E109 Type 1 diabetes mellitus without complications: Secondary | ICD-10-CM | POA: Diagnosis not present

## 2023-02-15 ENCOUNTER — Encounter: Payer: Self-pay | Admitting: Physician Assistant

## 2023-02-15 ENCOUNTER — Ambulatory Visit (INDEPENDENT_AMBULATORY_CARE_PROVIDER_SITE_OTHER): Payer: Medicare Other | Admitting: Physician Assistant

## 2023-02-15 VITALS — BP 125/55 | HR 96 | Temp 98.0°F | Ht 67.0 in | Wt 241.0 lb

## 2023-02-15 DIAGNOSIS — L03115 Cellulitis of right lower limb: Secondary | ICD-10-CM

## 2023-02-15 MED ORDER — SULFAMETHOXAZOLE-TRIMETHOPRIM 800-160 MG PO TABS
1.0000 | ORAL_TABLET | Freq: Two times a day (BID) | ORAL | 0 refills | Status: DC
Start: 2023-02-15 — End: 2023-02-22

## 2023-02-15 NOTE — Progress Notes (Signed)
Acute Office Visit   Patient: Mike Kline   DOB: 20-Feb-1971   52 y.o. Male  MRN: [id number] Visit Date: 02/15/2023  Today's healthcare provider: Oswaldo Conroy Matalie Romberger, PA-C  Introduced myself to the patient as a Secondary school teacher and provided education on APPs in clinical practice.    Chief Complaint  Patient presents with   Cellulitis   Fall    Patient says he fell through the floor about a week ago. Patient says he still has some soreness on his R shin area. Patient thinks he is noticing some swelling in his L knee and notices some pain. Patient says the discomfort he notices is constant and slight.    Subjective    HPI HPI     Fall    Additional comments: Patient says he fell through the floor about a week ago. Patient says he still has some soreness on his R shin area. Patient thinks he is noticing some swelling in his L knee and notices some pain. Patient says the discomfort he notices is constant and slight.       Last edited by [name], CMA on 02/15/2023  1:14 PM.       He reports he still has soreness in right leg - from cuts  He states he left knee has looked swollen to him and is feeling "weird"   He reports there is a spot below his right knee on the shin that is more painful and draining  He states he was only taking the Keflex 3 times per day for a few days but has increased to 4 times per day now that he has realized he was missing a dose  He has been using the Bactroban as directed    Medications: Outpatient Medications Prior to Visit  Medication Sig   albuterol (VENTOLIN HFA) 108 (90 Base) MCG/ACT inhaler Inhale 2 puffs into the lungs every 6 (six) hours as needed for wheezing or shortness of breath (Cough).   amitriptyline (ELAVIL) 100 MG tablet Take 1 tablet (100 mg total) by mouth at bedtime.   cephALEXin (KEFLEX) 500 MG capsule Take 500 mg by mouth 4 (four) times daily.   cetirizine (ZYRTEC) 10 MG tablet Take 10 mg by mouth daily.   Continuous Blood Gluc  Sensor (FREESTYLE LIBRE 2 SENSOR) MISC PLACE 1 TO THE BACK OF YOUR UPPER ARM, REPLACE EVERY 14 DAYS   gabapentin (NEURONTIN) 300 MG capsule TAKE ONE CAPSULE BY MOUTH THREE TIMES A DAY   HUMALOG KWIKPEN 100 UNIT/ML KiwkPen USE THREE TIMES A DAY AS DIRECTED.(UP TO 50 UNITS A DAY OR AS DIRECTED BY SLIDING SCALE) (Patient taking differently: Inject 6-10 Units into the skin 3 (three) times daily.)   insulin glargine (LANTUS SOLOSTAR) 100 UNIT/ML Solostar Pen Inject 40 Units into the skin daily at 10 pm. (Patient taking differently: Inject 50 Units into the skin daily at 10 pm.)   losartan (COZAAR) 50 MG tablet Take 1 tablet (50 mg total) by mouth daily.   melatonin 5 MG TABS Take 15 mg by mouth at bedtime as needed.   metoprolol tartrate (LOPRESSOR) 50 MG tablet Take 50 mg by mouth 2 (two) times daily.   omeprazole (PRILOSEC) 40 MG capsule Take 1 capsule (40 mg total) by mouth daily.   potassium chloride SA (KLOR-CON M) 20 MEQ tablet Take 20 mEq by mouth 2 (two) times daily.   rosuvastatin (CRESTOR) 40 MG tablet TAKE ONE TABLET BY MOUTH ONE  TIME DAILY   sildenafil (VIAGRA) 50 MG tablet Take 50 mg by mouth daily as needed.   torsemide (DEMADEX) 20 MG tablet Take 2 tablets (40 mg total) by mouth 2 (two) times daily.   triamcinolone cream (KENALOG) 0.1 % Apply 1 Application topically 2 (two) times daily.   No facility-administered medications prior to visit.    Review of Systems  Skin:  Positive for rash and wound.       Objective    BP (!) 125/55   Pulse 96   Temp 98 F (36.7 C) (Oral)   Ht 5\' 7"  (1.702 m)   Wt 241 lb (109.3 kg)   SpO2 95%   BMI 37.75 kg/m    Physical Exam Vitals reviewed.  Constitutional:      General: He is awake.     Appearance: Normal appearance. He is well-developed and well-groomed.  HENT:     Head: Normocephalic and atraumatic.  Pulmonary:     Effort: Pulmonary effort is normal.  Skin:    General: Skin is warm and dry.     Findings: Erythema, signs of  injury and wound present.  Neurological:     Mental Status: He is alert.  Psychiatric:        Behavior: Behavior is cooperative.      Media Information   Document Information  Photos    02/15/2023 13:38  Attached To:  Office Visit on 02/15/23 with Demario Faniel, Oswaldo Conroy, PA-C  Source Information  Parnika Tweten, Mirian Mo  [facility]    Media Information   Document Information  Photos    02/15/2023 13:38  Attached To:  Office Visit on 02/15/23 with Oreatha Fabry, Oswaldo Conroy, PA-C  Source Information  Inari Shin, Mirian Mo  [facility]     Media Information   Document Information  Photos    02/15/2023 13:37  Attached To:  Office Visit on 02/15/23 with Mylee Falin, Oswaldo Conroy, PA-C  Source Information  Eleuterio Dollar, Oswaldo Conroy, PA-C  [facility]    No results found for any visits on 02/15/23.  Assessment & Plan      No follow-ups on file.      Problem List Items Addressed This Visit   None Visit Diagnoses     Cellulitis of right lower extremity    -  Primary Acute, ongoing concern Patient presents today for follow up for cellulitis following recent fall through the floor at home He reports he has been taking Keflex TID instead of QID but has recently tried to follow directions  Wounds do not appear to be improving as expected with Keflex therapy Will switch to Bactrim PO BID for coverage and recommend he continues with Mupirocin for further abx coverage Recommend follow up in one week for monitoring If not improving with Bactrim - may need to refer to ID or wound management for evaluation.    Relevant Medications   sulfamethoxazole-trimethoprim (BACTRIM DS) 800-160 MG tablet        No follow-ups on file.   I, Lenea Bywater E Ellaree Gear, PA-C, have reviewed all documentation for this visit. The documentation on 02/17/23 for the exam, diagnosis, procedures, and orders are all accurate and complete.   Jacquelin Hawking, MHS, PA-C [facility]

## 2023-02-22 ENCOUNTER — Encounter: Payer: Self-pay | Admitting: Physician Assistant

## 2023-02-22 ENCOUNTER — Ambulatory Visit (INDEPENDENT_AMBULATORY_CARE_PROVIDER_SITE_OTHER): Payer: Medicare Other | Admitting: Physician Assistant

## 2023-02-22 DIAGNOSIS — L03115 Cellulitis of right lower limb: Secondary | ICD-10-CM

## 2023-02-22 MED ORDER — SULFAMETHOXAZOLE-TRIMETHOPRIM 800-160 MG PO TABS
1.0000 | ORAL_TABLET | Freq: Two times a day (BID) | ORAL | 0 refills | Status: AC
Start: 2023-02-22 — End: 2023-03-04

## 2023-02-22 NOTE — Progress Notes (Signed)
Acute Office Visit   Patient: Mike Kline   DOB: [birth year]   52 y.o. Male  MRN: [id number] Visit Date: 02/22/2023  Today's healthcare provider: Oswaldo Conroy Corgan Mormile, PA-C  Introduced myself to the patient as a Secondary school teacher and provided education on APPs in clinical practice.    Chief Complaint  Patient presents with   Cellulitis    Patient says he thinks it is getting better and looks better, but he says he will have some pain here and there and says it is located in the middle of the wound on his R shin area.    Subjective    HPI HPI     Cellulitis    Additional comments: Patient says he thinks it is getting better and looks better, but he says he will have some pain here and there and says it is located in the middle of the wound on his R shin area.       Last edited by [name], CMA on 02/22/2023  1:38 PM.      He is taking Bactrim and using murpirocin as directed  He reports mild pain from time to time in the middle of his shin   He has been using Aquaphor for dryness but did not apply this AM     Medications: Outpatient Medications Prior to Visit  Medication Sig   albuterol (VENTOLIN HFA) 108 (90 Base) MCG/ACT inhaler Inhale 2 puffs into the lungs every 6 (six) hours as needed for wheezing or shortness of breath (Cough).   amitriptyline (ELAVIL) 100 MG tablet Take 1 tablet (100 mg total) by mouth at bedtime.   cetirizine (ZYRTEC) 10 MG tablet Take 10 mg by mouth daily.   Continuous Blood Gluc Sensor (FREESTYLE LIBRE 2 SENSOR) MISC PLACE 1 TO THE BACK OF YOUR UPPER ARM, REPLACE EVERY 14 DAYS   gabapentin (NEURONTIN) 300 MG capsule TAKE ONE CAPSULE BY MOUTH THREE TIMES A DAY   HUMALOG KWIKPEN 100 UNIT/ML KiwkPen USE THREE TIMES A DAY AS DIRECTED.(UP TO 50 UNITS A DAY OR AS DIRECTED BY SLIDING SCALE) (Patient taking differently: Inject 6-10 Units into the skin 3 (three) times daily.)   insulin glargine (LANTUS SOLOSTAR) 100 UNIT/ML Solostar Pen Inject 40 Units into  the skin daily at 10 pm. (Patient taking differently: Inject 50 Units into the skin daily at 10 pm.)   losartan (COZAAR) 50 MG tablet Take 1 tablet (50 mg total) by mouth daily.   melatonin 5 MG TABS Take 15 mg by mouth at bedtime as needed.   metoprolol tartrate (LOPRESSOR) 50 MG tablet Take 50 mg by mouth 2 (two) times daily.   omeprazole (PRILOSEC) 40 MG capsule Take 1 capsule (40 mg total) by mouth daily.   potassium chloride SA (KLOR-CON M) 20 MEQ tablet Take 20 mEq by mouth 2 (two) times daily.   rosuvastatin (CRESTOR) 40 MG tablet TAKE ONE TABLET BY MOUTH ONE TIME DAILY   sildenafil (VIAGRA) 50 MG tablet Take 50 mg by mouth daily as needed.   torsemide (DEMADEX) 20 MG tablet Take 2 tablets (40 mg total) by mouth 2 (two) times daily.   triamcinolone cream (KENALOG) 0.1 % Apply 1 Application topically 2 (two) times daily.   [DISCONTINUED] sulfamethoxazole-trimethoprim (BACTRIM DS) 800-160 MG tablet Take 1 tablet by mouth 2 (two) times daily for 10 days.   No facility-administered medications prior to visit.    Review of Systems  Skin:  Positive for rash  and wound.         Objective    BP 129/71   Pulse 73   Temp 98 F (36.7 C) (Oral)   Ht 5\' 7"  (1.702 m)   Wt 236 lb 12.8 oz (107.4 kg)   SpO2 100%   BMI 37.09 kg/m      Physical Exam Vitals reviewed.  Constitutional:      General: He is awake.     Appearance: Normal appearance. He is well-developed and well-groomed.  HENT:     Head: Normocephalic and atraumatic.  Skin:    General: Skin is warm and dry.     Findings: Abrasion, erythema and wound present.  Neurological:     Mental Status: He is alert.  Psychiatric:        Behavior: Behavior is cooperative.       Media Information   Document Information  Photos    02/22/2023 14:09  Attached To:  Office Visit on 02/22/23 with Turkessa Ostrom, Oswaldo Conroy, PA-C  Source Information  Aneesh Faller, Mirian Mo  [facility]  Document History      Media  Information   Document Information  Photos    02/22/2023 14:09  Attached To:  Office Visit on 02/22/23 with Kennya Schwenn, Oswaldo Conroy, PA-C  Source Information  Aretta Stetzel, Mirian Mo  [facility]  Document History       Media Information   Document Information  Photos    02/22/2023 14:09  Attached To:  Office Visit on 02/22/23 with Shirlee Whitmire, Oswaldo Conroy, PA-C  Source Information  Ahri Olson, Mirian Mo  [facility]  Document History     No results found for any visits on 02/22/23.  Assessment & Plan      No follow-ups on file.       Problem List Items Addressed This Visit   None Visit Diagnoses     Cellulitis of right lower extremity     Acute, ongoing concern, improving since last week Bilateral cellulitis appears to be improving today compared to previous exams Suspect delayed healing is likely due to DM and location of cellulitis infection  Will repeat bactrim course for another 10 days and continue mupirocin for management If this continues to improve patient does not need to return  Discussed return precautions with him - he voiced understanding and agreement Follow up as needed for persistent or progressing symptoms     Relevant Medications   sulfamethoxazole-trimethoprim (BACTRIM DS) 800-160 MG tablet        No follow-ups on file.   I, Sherilyn Windhorst E Christyn Gutkowski, PA-C, have reviewed all documentation for this visit. The documentation on 02/22/23 for the exam, diagnosis, procedures, and orders are all accurate and complete.   Jacquelin Hawking, MHS, PA-C [facility]

## 2023-04-03 ENCOUNTER — Other Ambulatory Visit: Payer: Self-pay | Admitting: Nurse Practitioner

## 2023-04-03 DIAGNOSIS — E1042 Type 1 diabetes mellitus with diabetic polyneuropathy: Secondary | ICD-10-CM

## 2023-04-05 NOTE — Telephone Encounter (Signed)
  2 months ago (01/21/2023)  #270   Requested Prescriptions  Refused Prescriptions Disp Refills   gabapentin (NEURONTIN) 300 MG capsule [Pharmacy Med Name: GABAPENTIN 300 MG CAP[*]] 270 capsule 0    Sig: TAKE ONE CAPSULE BY MOUTH THREE TIMES A DAY     Neurology: Anticonvulsants - gabapentin Failed - 04/03/2023  2:12 AM      Failed - Cr in normal range and within 360 days    Creatinine, Ser  Date Value Ref Range Status  07/24/2022 1.40 (H) 0.76 - 1.27 mg/dL Final         Passed - Completed PHQ-2 or PHQ-9 in the last 360 days      Passed - Valid encounter within last 12 months    Recent Outpatient Visits           1 month ago Cellulitis of right lower extremity   [place] [facility] Mecum, Erin E, PA-C   1 month ago Cellulitis of right lower extremity   [place] [facility] Mecum, Erin E, PA-C   1 month ago Chronic right-sided low back pain, unspecified whether sciatica present   [place] [facility] Mecum, Oswaldo Conroy, PA-C   3 months ago COVID   [place] [facility] Clayton, Clinton T, NP   4 months ago Acute cough   [place] [facility] [place], Dorie Rank, NP

## 2023-04-06 ENCOUNTER — Other Ambulatory Visit: Payer: Self-pay | Admitting: Nurse Practitioner

## 2023-04-06 DIAGNOSIS — E1042 Type 1 diabetes mellitus with diabetic polyneuropathy: Secondary | ICD-10-CM

## 2023-04-06 MED ORDER — GABAPENTIN 300 MG PO CAPS
300.0000 mg | ORAL_CAPSULE | Freq: Three times a day (TID) | ORAL | 4 refills | Status: DC
Start: 2023-04-06 — End: 2024-03-14

## 2023-04-08 ENCOUNTER — Encounter: Payer: Self-pay | Admitting: Nurse Practitioner

## 2023-04-08 DIAGNOSIS — I152 Hypertension secondary to endocrine disorders: Secondary | ICD-10-CM

## 2023-04-08 MED ORDER — TORSEMIDE 20 MG PO TABS
40.0000 mg | ORAL_TABLET | Freq: Two times a day (BID) | ORAL | 4 refills | Status: DC
Start: 2023-04-08 — End: 2024-04-11

## 2023-05-13 DIAGNOSIS — E1042 Type 1 diabetes mellitus with diabetic polyneuropathy: Secondary | ICD-10-CM | POA: Diagnosis not present

## 2023-05-13 DIAGNOSIS — E1059 Type 1 diabetes mellitus with other circulatory complications: Secondary | ICD-10-CM | POA: Diagnosis not present

## 2023-05-13 DIAGNOSIS — E1029 Type 1 diabetes mellitus with other diabetic kidney complication: Secondary | ICD-10-CM | POA: Diagnosis not present

## 2023-05-13 DIAGNOSIS — E103553 Type 1 diabetes mellitus with stable proliferative diabetic retinopathy, bilateral: Secondary | ICD-10-CM | POA: Diagnosis not present

## 2023-05-13 DIAGNOSIS — E1065 Type 1 diabetes mellitus with hyperglycemia: Secondary | ICD-10-CM | POA: Diagnosis not present

## 2023-05-13 DIAGNOSIS — I10 Essential (primary) hypertension: Secondary | ICD-10-CM | POA: Diagnosis not present

## 2023-05-13 DIAGNOSIS — R809 Proteinuria, unspecified: Secondary | ICD-10-CM | POA: Diagnosis not present

## 2023-05-13 DIAGNOSIS — E1069 Type 1 diabetes mellitus with other specified complication: Secondary | ICD-10-CM | POA: Diagnosis not present

## 2023-06-03 DIAGNOSIS — E109 Type 1 diabetes mellitus without complications: Secondary | ICD-10-CM | POA: Diagnosis not present

## 2023-06-11 DIAGNOSIS — Z419 Encounter for procedure for purposes other than remedying health state, unspecified: Secondary | ICD-10-CM | POA: Diagnosis not present

## 2023-06-20 ENCOUNTER — Encounter (INDEPENDENT_AMBULATORY_CARE_PROVIDER_SITE_OTHER): Payer: Self-pay

## 2023-06-21 ENCOUNTER — Ambulatory Visit (INDEPENDENT_AMBULATORY_CARE_PROVIDER_SITE_OTHER): Payer: Medicare Other | Admitting: Nurse Practitioner

## 2023-06-21 NOTE — Telephone Encounter (Signed)
Mike Kline is calling to reschedule patient.

## 2023-06-27 NOTE — Patient Instructions (Signed)

## 2023-06-28 ENCOUNTER — Ambulatory Visit
Admission: RE | Admit: 2023-06-28 | Discharge: 2023-06-28 | Disposition: A | Discharge status: Home / Self Care | Payer: Medicare Other | Source: Ambulatory Visit | Attending: Nurse Practitioner | Admitting: Nurse Practitioner

## 2023-06-28 ENCOUNTER — Encounter: Payer: Self-pay | Admitting: Nurse Practitioner

## 2023-06-28 ENCOUNTER — Ambulatory Visit: Payer: Medicare Other | Admitting: Nurse Practitioner

## 2023-06-28 ENCOUNTER — Ambulatory Visit
Admission: RE | Admit: 2023-06-28 | Discharge: 2023-06-28 | Disposition: A | Discharge status: Home / Self Care | Payer: Medicare Other | Attending: Nurse Practitioner | Admitting: Nurse Practitioner

## 2023-06-28 VITALS — BP 138/68 | HR 86 | Temp 98.4°F | Ht 67.0 in | Wt 248.0 lb

## 2023-06-28 DIAGNOSIS — E1069 Type 1 diabetes mellitus with other specified complication: Secondary | ICD-10-CM

## 2023-06-28 DIAGNOSIS — E1051 Type 1 diabetes mellitus with diabetic peripheral angiopathy without gangrene: Secondary | ICD-10-CM | POA: Diagnosis not present

## 2023-06-28 DIAGNOSIS — E103553 Type 1 diabetes mellitus with stable proliferative diabetic retinopathy, bilateral: Secondary | ICD-10-CM

## 2023-06-28 DIAGNOSIS — G8929 Other chronic pain: Secondary | ICD-10-CM | POA: Diagnosis not present

## 2023-06-28 DIAGNOSIS — M545 Low back pain, unspecified: Secondary | ICD-10-CM | POA: Diagnosis not present

## 2023-06-28 DIAGNOSIS — M5441 Lumbago with sciatica, right side: Secondary | ICD-10-CM

## 2023-06-28 DIAGNOSIS — J4540 Moderate persistent asthma, uncomplicated: Secondary | ICD-10-CM

## 2023-06-28 DIAGNOSIS — F324 Major depressive disorder, single episode, in partial remission: Secondary | ICD-10-CM

## 2023-06-28 DIAGNOSIS — M542 Cervicalgia: Secondary | ICD-10-CM | POA: Insufficient documentation

## 2023-06-28 DIAGNOSIS — F419 Anxiety disorder, unspecified: Secondary | ICD-10-CM

## 2023-06-28 DIAGNOSIS — E1059 Type 1 diabetes mellitus with other circulatory complications: Secondary | ICD-10-CM | POA: Diagnosis not present

## 2023-06-28 DIAGNOSIS — M47812 Spondylosis without myelopathy or radiculopathy, cervical region: Secondary | ICD-10-CM | POA: Diagnosis not present

## 2023-06-28 DIAGNOSIS — M47816 Spondylosis without myelopathy or radiculopathy, lumbar region: Secondary | ICD-10-CM | POA: Diagnosis not present

## 2023-06-28 DIAGNOSIS — E1042 Type 1 diabetes mellitus with diabetic polyneuropathy: Secondary | ICD-10-CM | POA: Diagnosis not present

## 2023-06-28 DIAGNOSIS — E785 Hyperlipidemia, unspecified: Secondary | ICD-10-CM

## 2023-06-28 LAB — HEMOGLOBIN A1C: A1c: 10.5

## 2023-06-28 MED ORDER — METHOCARBAMOL 750 MG PO TABS: 750.0000 mg | ORAL_TABLET | Freq: Three times a day (TID) | ORAL | 0 refills | Status: DC | PRN

## 2023-06-28 MED ORDER — LIDOCAINE 5 % EX PTCH: 1.0000 | MEDICATED_PATCH | CUTANEOUS | 0 refills | Status: DC

## 2023-06-28 MED ORDER — HYDROCODONE-ACETAMINOPHEN 5-325 MG PO TABS: 1.0000 | ORAL_TABLET | Freq: Four times a day (QID) | ORAL | 0 refills | Status: AC | PRN

## 2023-06-28 NOTE — Assessment & Plan Note (Signed)
Chronic, ongoing issue along with neck pain.  Suspect related to deconditioning.  Will obtain imaging lower back and cervical spine.  Start Robaxin as needed + Lidocaine patches.  Five days of Norco 5-325 MG sent in to use sparsely for severe pain only, no refills -- he is aware of this.  Referral to ortho for further evaluation.  May benefit from PT with them.

## 2023-06-28 NOTE — Assessment & Plan Note (Signed)
Chronic, ongoing.  A1c recent with endo 10.5% and urine ALB 07 November 2022.  Continue collaboration with endo, recent notes reviewed. Continue Amitriptyline and Gabapentin as ordered. Discussed with him importance of diabetes control to prevent worsening symptoms or wounds which could lead to loss of limb.  Have highly recommended he look into insulin pump placement as endo has recommended and discussed at length the risks of poor diabetes control. - ARB and Statin on board - Needs eye exam.  Foot exam up to date - Vaccinations - refuses flu vaccine - All labs up to date with endo.

## 2023-06-28 NOTE — Assessment & Plan Note (Signed)
Refer to depression plan of care. 

## 2023-06-28 NOTE — Progress Notes (Signed)
BP 138/68 (BP Location: Left Arm, Patient Position: Sitting)   Pulse 86   Temp 98.4 F (36.9 C) (Oral)   Ht 5\' 7"  (1.702 m)   Wt 248 lb (112.5 kg)   SpO2 99%   BMI 38.84 kg/m    Subjective:    Patient ID: Mike Kline, male    DOB: Aug 11, 1970, 52 y.o.   MRN: [id number]  HPI: Mike Kline is a 52 y.o. male  Chief Complaint  Patient presents with   Asthma   Depression   Diabetes    No recent eye exam per patient   Hyperlipidemia   Hypertension   Gastroesophageal Reflux   Anxiety   DIABETES (TYPE 1) Followed by Dr. Tedd Sias, endo, with last visit 05/13/23.  A1c 10.5% increased insulin -- they recommend insulin pump but he reports he does not want this and worries about it.  Freestyle -- average glucose over 30 days = 221.  Time in Target 30 days: Above 65%, In Target 33%, Below 2%.  Was >62 years old when diagnosed with diabetes. Does endorse frustration with his health, as was very healthy prior to his MI years ago.  Has ongoing redness to both legs with dry skin with venous insufficiency. Saw vascular last 12/17/22. Hypoglycemic episodes:as above Polydipsia/polyuria: no Visual disturbance: no Chest pain: no Paresthesias: no Glucose Monitoring: yes  Accucheck frequency: TID  Fasting glucose:  Post prandial:  Evening:  Before meals: Taking Insulin?: yes  Long acting insulin: Lantus 50 units  Short acting insulin: Humalog 6-30 units sliding scale Blood Pressure Monitoring: not checking Retinal Examination: Not up to Date Foot Exam: Up to Date Pneumovax: Up to Date Influenza: Not Up to Date Aspirin: no   HYPERTENSION / HYPERLIPIDEMIA & ASTHMA Saw cardiology last 06/02/22, they recommended a sleep study -- he reports not "believing in all that stuff".  Continues Albuterol as needed for asthma, no recent pulmonary visit. Satisfied with current treatment? yes Duration of hypertension: chronic BP monitoring frequency: not checking BP range:  BP medication side effects:  no Duration of hyperlipidemia: chronic Cholesterol medication side effects: no Cholesterol supplements: none Medication compliance: good compliance Aspirin: no Recent stressors: no Recurrent headaches: no Visual changes: no Palpitations: no Dyspnea: no Chest pain: no Lower extremity edema: no Dizzy/lightheaded: no  The 10-year ASCVD risk score (Arnett DK, et al., 2019) is: 10.9%   Values used to calculate the score:     Age: 48 years     Sex: Male     Is Non-Hispanic African American: No     Diabetic: Yes     Tobacco smoker: No     Systolic Blood Pressure: 138 mmHg     Is BP treated: Yes     HDL Cholesterol: 36 mg/dL     Total Cholesterol: 136 mg/dL   GERD Continues Omeprazole. GERD control status: stable Satisfied with current treatment? yes Heartburn frequency: occasional Medication side effects: no  Medication compliance: stable Dysphagia: no Odynophagia:  no Hematemesis: no Blood in stool: no EGD: yes   BACK PAIN Chronic issue -- ongoing.  Walking from check out to car is tiring.  Can not do daily ADLs or attend appointments at times.  He has never had back injury.  No recent imaging of back or neck.  Has difficulty turning neck. Duration: months Mechanism of injury: unknown Location: bilateral and low back Onset: gradual Severity: 10/10 at worst and 0/10 at best Quality: dull, aching, and throbbing Frequency: intermittent Radiation: none Aggravating  factors: lifting, movement, and bending Alleviating factors: rest Status: fluctuating Treatments attempted: rest and APAP, BC Powder, icy/hot, lidocaine patches Relief with NSAIDs?: No NSAIDs Taken Nighttime pain:  no Paresthesias / decreased sensation:  no Bowel / bladder incontinence:  no Fevers:  no Dysuria / urinary frequency:  no   DEPRESSION Continues Amitriptyline daily. Mood status: stable Satisfied with current treatment?: yes Symptom severity: mild  Duration of current treatment :  chronic Side effects: no Medication compliance: good compliance Depressed mood: no Anxious mood: yes Anhedonia: no Significant weight loss or gain: no Insomnia: yes hard to fall asleep Fatigue: no Feelings of worthlessness or guilt: no Impaired concentration/indecisiveness: no Suicidal ideations: no Hopelessness: no Crying spells: no    06/28/2023    1:59 PM 02/22/2023    1:41 PM 02/08/2023    2:44 PM 12/15/2022    3:53 PM 10/29/2022   10:20 AM  Depression screen PHQ 2/9  Decreased Interest 0 1 0 1 0  Down, Depressed, Hopeless 0 1 0 0 0  PHQ - 2 Score 0 2 0 1 0  Altered sleeping 0 1 3 0 0  Tired, decreased energy 0 1 3 1 2   Change in appetite 0 1 1 0 0  Feeling bad or failure about yourself  0 1 0 0 1  Trouble concentrating 0 0 0 0 0  Moving slowly or fidgety/restless 3 0 0 0 0  Suicidal thoughts 0 0 0 0 0  PHQ-9 Score 3 6 7 2 3   Difficult doing work/chores Not difficult at all Not difficult at all Very difficult Not difficult at all Not difficult at all       06/28/2023    2:00 PM 02/22/2023    1:41 PM 02/08/2023    2:44 PM 12/15/2022    3:53 PM  GAD 7 : Generalized Anxiety Score  Nervous, Anxious, on Edge 0 0 2 0  Control/stop worrying 0 1 0 0  Worry too much - different things 0 0 1 0  Trouble relaxing 0 0 1 0  Restless 0 0 0 0  Easily annoyed or irritable 0 1 3 0  Afraid - awful might happen 0 0 0 0  Total GAD 7 Score 0 2 7 0  Anxiety Difficulty Not difficult at all Not difficult at all Somewhat difficult Not difficult at all   Relevant past medical, surgical, family and social history reviewed and updated as indicated. Interim medical history since our last visit reviewed. Allergies and medications reviewed and updated.  Review of Systems  Constitutional:  Negative for activity change, diaphoresis, fatigue and fever.  Respiratory:  Negative for cough, chest tightness, shortness of breath and wheezing.   Cardiovascular:  Negative for chest pain, palpitations and leg  swelling.  Gastrointestinal: Negative.   Endocrine: Negative for polydipsia, polyphagia and polyuria.  Musculoskeletal:  Positive for back pain.  Neurological: Negative.   Psychiatric/Behavioral: Negative.      Per HPI unless specifically indicated above     Objective:    BP 138/68 (BP Location: Left Arm, Patient Position: Sitting)   Pulse 86   Temp 98.4 F (36.9 C) (Oral)   Ht 5\' 7"  (1.702 m)   Wt 248 lb (112.5 kg)   SpO2 99%   BMI 38.84 kg/m   Wt Readings from Last 3 Encounters:  06/28/23 248 lb (112.5 kg)  02/22/23 236 lb 12.8 oz (107.4 kg)  02/15/23 241 lb (109.3 kg)    Physical Exam Vitals and nursing note reviewed.  Constitutional:      General: He is awake. He is not in acute distress.    Appearance: Normal appearance. He is well-developed and well-groomed. He is obese. He is not ill-appearing or toxic-appearing.  HENT:     Head: Normocephalic.     Right Ear: Hearing and external ear normal.     Left Ear: Hearing and external ear normal.  Eyes:     General: Lids are normal.     Extraocular Movements: Extraocular movements intact.     Conjunctiva/sclera: Conjunctivae normal.  Neck:     Thyroid: No thyromegaly.     Vascular: No carotid bruit.  Cardiovascular:     Rate and Rhythm: Normal rate and regular rhythm.     Heart sounds: Normal heart sounds. No murmur heard.    No gallop.  Pulmonary:     Effort: Pulmonary effort is normal. No accessory muscle usage or respiratory distress.     Breath sounds: Normal breath sounds. No decreased breath sounds, wheezing or rhonchi.  Abdominal:     General: Bowel sounds are normal. There is no distension.     Palpations: Abdomen is soft.     Tenderness: There is no abdominal tenderness.  Musculoskeletal:     Cervical back: No swelling, rigidity, torticollis, tenderness, bony tenderness or crepitus. Pain with movement present. Decreased range of motion.     Lumbar back: Tenderness present. No swelling, spasms or bony  tenderness. Decreased range of motion. Positive right straight leg raise test. Negative left straight leg raise test.     Right lower leg: No edema.     Left lower leg: No edema.  Lymphadenopathy:     Cervical: No cervical adenopathy.  Skin:    General: Skin is warm.     Capillary Refill: Capillary refill takes less than 2 seconds.  Neurological:     Mental Status: He is alert and oriented to person, place, and time.     Deep Tendon Reflexes: Reflexes are normal and symmetric.     Reflex Scores:      Brachioradialis reflexes are 2+ on the right side and 2+ on the left side.      Patellar reflexes are 2+ on the right side and 2+ on the left side. Psychiatric:        Attention and Perception: Attention normal.        Mood and Affect: Mood normal.        Speech: Speech normal.        Behavior: Behavior normal. Behavior is cooperative.        Thought Content: Thought content normal.     Results for orders placed or performed in visit on 06/28/23  Hemoglobin A1c  Result Value Ref Range   A1c 10.5       Assessment & Plan:   Problem List Items Addressed This Visit       Cardiovascular and Mediastinum   Hypertension associated with type 1 diabetes mellitus (HCC)    Chronic, ongoing.  BP at goal in office today on recheck.  Recommend he monitor BP at least a few mornings a week at home and document.  DASH diet at home.  Continue current medication regimen and adjust as needed.  Urine ALB 07 November 2022, continue Losartan for kidney protection.  Labs today: up to date with endo.         Type 1 diabetes mellitus with peripheral vascular disease (HCC)    Chronic, ongoing.  A1c recent with endo 10.5%  and urine ALB 07 November 2022.  Continue collaboration with endo, recent notes reviewed. Continue Amitriptyline and Gabapentin as ordered. Discussed with him importance of diabetes control to prevent worsening symptoms or wounds which could lead to loss of limb.  Have highly recommended he look  into insulin pump placement as endo has recommended and discussed at length the risks of poor diabetes control. - ARB and Statin on board - Needs eye exam.  Foot exam up to date - Vaccinations - refuses flu vaccine - All labs up to date with endo. - Recommend continue to follow with vascular.        Respiratory   Moderate asthma without complication    Chronic, stable.  Continue Albuterol as needed and in future start maintenance inhaler if worsening symptoms.  Return to pulmonary as needed.          Endocrine   DM type 1 with diabetic peripheral neuropathy (HCC)    Chronic, ongoing.  A1c recent with endo 10.5% and urine ALB 07 November 2022.  Continue collaboration with endo, recent notes reviewed. Continue Amitriptyline and Gabapentin as ordered. Discussed with him importance of diabetes control to prevent worsening symptoms or wounds which could lead to loss of limb.  Have highly recommended he look into insulin pump placement as endo has recommended and discussed at length the risks of poor diabetes control. - ARB and Statin on board - Needs eye exam.  Foot exam up to date - Vaccinations - refuses flu vaccine - All labs up to date with endo.      Relevant Medications   methocarbamol (ROBAXIN-750) 750 MG tablet   Hyperlipidemia due to type 1 diabetes mellitus (HCC)    Chronic, ongoing.  Continue current medication regimen and adjust as needed.  Lipid panel up to date with endo.      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  Recommend he schedule eye exam ASAP.  A1c recent with endo 10.5% and urine ALB 07 November 2022.  Continue collaboration with endo, recent notes reviewed.        Other   Anxiety    Refer to depression plan of care.      Chronic back pain - Primary    Chronic, ongoing issue along with neck pain.  Suspect related to deconditioning.  Will obtain imaging lower back and cervical spine.  Start Robaxin as  needed + Lidocaine patches.  Five days of Norco 5-325 MG sent in to use sparsely for severe pain only, no refills -- he is aware of this.  Referral to ortho for further evaluation.  May benefit from PT with them.      Relevant Medications   methocarbamol (ROBAXIN-750) 750 MG tablet   HYDROcodone-acetaminophen (NORCO) 5-325 MG tablet   Other Relevant Orders   DG Lumbar Spine Complete   Ambulatory referral to Orthopedic Surgery   Depression    Chronic, stable.  Denies SI/HI.  Continue Amitriptyline and consider SSRI in future, although concern for weight gain with these.  Monitor closely.        Morbid obesity (HCC)    BMI 38.84 with T1DM and CAD.  Recommended eating smaller high protein, low fat meals more frequently and exercising 30 mins a day 5 times a week with a goal of 10-15lb weight loss in the next 3 months. Patient voiced their understanding and motivation to adhere to these recommendations.       Other  Visit Diagnoses     Chronic neck pain       Refer to back pain plan of care.   Relevant Medications   methocarbamol (ROBAXIN-750) 750 MG tablet   HYDROcodone-acetaminophen (NORCO) 5-325 MG tablet   Other Relevant Orders   DG Cervical Spine Complete   Ambulatory referral to Orthopedic Surgery        Follow up plan: Return in about 6 months (around 12/26/2023) for T2DM, HTN/HLD, BACK PAIN, GERD.

## 2023-06-28 NOTE — Assessment & Plan Note (Signed)
Chronic, ongoing.  Continue current medication regimen and adjust as needed.  Lipid panel up to date with endo.

## 2023-06-28 NOTE — Assessment & Plan Note (Signed)
BMI 38.84 with T1DM and CAD.  Recommended eating smaller high protein, low fat meals more frequently and exercising 30 mins a day 5 times a week with a goal of 10-15lb weight loss in the next 3 months. Patient voiced their understanding and motivation to adhere to these recommendations.

## 2023-06-28 NOTE — Assessment & Plan Note (Signed)
Chronic, ongoing.  A1c recent with endo 10.5% and urine ALB 07 November 2022.  Continue collaboration with endo, recent notes reviewed. Continue Amitriptyline and Gabapentin as ordered. Discussed with him importance of diabetes control to prevent worsening symptoms or wounds which could lead to loss of limb.  Have highly recommended he look into insulin pump placement as endo has recommended and discussed at length the risks of poor diabetes control. - ARB and Statin on board - Needs eye exam.  Foot exam up to date - Vaccinations - refuses flu vaccine - All labs up to date with endo. - Recommend continue to follow with vascular.

## 2023-06-28 NOTE — Assessment & Plan Note (Signed)
Chronic, stable.  Continue Albuterol as needed and in future start maintenance inhaler if worsening symptoms.  Return to pulmonary as needed.

## 2023-06-28 NOTE — Assessment & Plan Note (Signed)
Chronic, stable.  Denies SI/HI.  Continue Amitriptyline and consider SSRI in future, although concern for weight gain with these.  Monitor closely.

## 2023-06-28 NOTE — Assessment & Plan Note (Signed)
Chronic, ongoing.  Recommend he schedule eye exam ASAP.  A1c recent with endo 10.5% and urine ALB 07 November 2022.  Continue collaboration with endo, recent notes reviewed.

## 2023-06-28 NOTE — Assessment & Plan Note (Signed)
Chronic, ongoing.  BP at goal in office today on recheck.  Recommend he monitor BP at least a few mornings a week at home and document.  DASH diet at home.  Continue current medication regimen and adjust as needed.  Urine ALB 07 November 2022, continue Losartan for kidney protection.  Labs today: up to date with endo.

## 2023-06-29 NOTE — Progress Notes (Signed)
Contacted via MyChart   Good evening Mike Kline, your imaging has returned.  Both lower back and neck are showing some degenerative changes -- arthritis.  We will see what ortho says and I suspect they will recommend some physical therapy, which would be wonderful for you.  Any questions? Keep being stellar!!  Thank you for allowing me to participate in your care.  I appreciate you. Kindest regards, Annalynne Ibanez

## 2023-07-02 DIAGNOSIS — G4733 Obstructive sleep apnea (adult) (pediatric): Secondary | ICD-10-CM | POA: Diagnosis not present

## 2023-07-02 DIAGNOSIS — I2510 Atherosclerotic heart disease of native coronary artery without angina pectoris: Secondary | ICD-10-CM | POA: Diagnosis not present

## 2023-07-02 DIAGNOSIS — R600 Localized edema: Secondary | ICD-10-CM | POA: Diagnosis not present

## 2023-07-02 DIAGNOSIS — I10 Essential (primary) hypertension: Secondary | ICD-10-CM | POA: Diagnosis not present

## 2023-07-02 DIAGNOSIS — Z951 Presence of aortocoronary bypass graft: Secondary | ICD-10-CM | POA: Diagnosis not present

## 2023-07-02 DIAGNOSIS — R0602 Shortness of breath: Secondary | ICD-10-CM | POA: Diagnosis not present

## 2023-07-02 DIAGNOSIS — E782 Mixed hyperlipidemia: Secondary | ICD-10-CM | POA: Diagnosis not present

## 2023-07-02 DIAGNOSIS — E108 Type 1 diabetes mellitus with unspecified complications: Secondary | ICD-10-CM | POA: Diagnosis not present

## 2023-07-02 DIAGNOSIS — Z87891 Personal history of nicotine dependence: Secondary | ICD-10-CM | POA: Diagnosis not present

## 2023-07-02 DIAGNOSIS — I2583 Coronary atherosclerosis due to lipid rich plaque: Secondary | ICD-10-CM | POA: Diagnosis not present

## 2023-07-11 DIAGNOSIS — Z419 Encounter for procedure for purposes other than remedying health state, unspecified: Secondary | ICD-10-CM | POA: Diagnosis not present

## 2023-08-16 ENCOUNTER — Other Ambulatory Visit: Payer: Self-pay | Admitting: Nurse Practitioner

## 2023-08-17 NOTE — Telephone Encounter (Signed)
 Requested medication (s) are due for refill today - yes  Requested medication (s) are on the active medication list -yes  Future visit scheduled -yes  Last refill: 06/28/23 #90  Notes to clinic: non delegated Rx  Requested Prescriptions  Pending Prescriptions Disp Refills   methocarbamol  (ROBAXIN ) 750 MG tablet [Pharmacy Med Name: METHOCARBAMOL  750 MG TAB] 90 tablet 0    Sig: TAKE ONE TABLET BY MOUTH EVERY 8 HOURS AS NEEDED FOR MUSCLE SPASMS     Not Delegated - Analgesics:  Muscle Relaxants Failed - 08/17/2023 12:18 PM      Failed - This refill cannot be delegated      Passed - Valid encounter within last 6 months    Recent Outpatient Visits           1 month ago Chronic bilateral low back pain with right-sided sciatica   [place] [facility] [place], Melanie T, NP   5 months ago Cellulitis of right lower extremity   [place] [facility] Mecum, Erin E, PA-C   6 months ago Cellulitis of right lower extremity   [place] [facility] Mecum, Erin E, PA-C   6 months ago Chronic right-sided low back pain, unspecified whether sciatica present   [place] [facility] Mecum, Rocky BRAVO, PA-C   8 months ago COVID   [place] [facility] [place], [place] T, NP       Future Appointments             In 4 months Cannady, Jolene T, NP [place] [facility]               Requested Prescriptions  Pending Prescriptions Disp Refills   methocarbamol  (ROBAXIN ) 750 MG tablet [Pharmacy Med Name: METHOCARBAMOL  750 MG TAB] 90 tablet 0    Sig: TAKE ONE TABLET BY MOUTH EVERY 8 HOURS AS NEEDED FOR MUSCLE SPASMS     Not Delegated - Analgesics:  Muscle Relaxants Failed - 08/17/2023 12:18 PM      Failed - This refill cannot be delegated      Passed - Valid encounter within last 6 months    Recent Outpatient Visits           1 month ago Chronic bilateral low back pain with right-sided sciatica   [facility] [place], Melanie T, NP   5 months ago Cellulitis of right lower extremity   [place] [facility] Mecum, Erin E, PA-C   6 months ago Cellulitis of right lower extremity   [place] [facility] Mecum, Erin E, PA-C   6 months ago Chronic right-sided low back pain, unspecified whether sciatica present   [place] [facility] Mecum, Rocky BRAVO, PA-C   8 months ago COVID   [place] [facility] [place], [place] T, NP       Future Appointments             In 4 months Cannady, Jolene T, NP [place] [facility], [facility]

## 2023-09-02 ENCOUNTER — Encounter: Payer: Self-pay | Admitting: Nurse Practitioner

## 2023-09-02 NOTE — Telephone Encounter (Signed)
Left message on machine for pt to call and schedule an office visit for back pain

## 2023-09-08 DIAGNOSIS — E109 Type 1 diabetes mellitus without complications: Secondary | ICD-10-CM | POA: Diagnosis not present

## 2023-09-23 ENCOUNTER — Other Ambulatory Visit: Payer: Self-pay | Admitting: Nurse Practitioner

## 2023-09-23 DIAGNOSIS — E1059 Type 1 diabetes mellitus with other circulatory complications: Secondary | ICD-10-CM

## 2023-09-24 NOTE — Telephone Encounter (Signed)
Requested medication (s) are due for refill today: yes  Requested medication (s) are on the active medication list: yes  Last refill:  10/29/22 #90 2 RF  Future visit scheduled: yes  Notes to clinic:  overdue lab work   Requested Prescriptions  Pending Prescriptions Disp Refills   losartan (COZAAR) 50 MG tablet [Pharmacy Med Name: LOSARTAN 50 MG TAB[*]] 90 tablet 2    Sig: TAKE ONE TABLET BY MOUTH ONE TIME DAILY     Cardiovascular:  Angiotensin Receptor Blockers Failed - 09/24/2023  2:18 PM      Failed - Cr in normal range and within 180 days    Creatinine, Ser  Date Value Ref Range Status  07/24/2022 1.40 (H) 0.76 - 1.27 mg/dL Final         Failed - K in normal range and within 180 days    Potassium  Date Value Ref Range Status  07/24/2022 4.2 3.5 - 5.2 mmol/L Final         Passed - Patient is not pregnant      Passed - Last BP in normal range    BP Readings from Last 1 Encounters:  06/28/23 138/68         Passed - Valid encounter within last 6 months    Recent Outpatient Visits           2 months ago Chronic bilateral low back pain with right-sided sciatica   [place] [facility] [place], Corrie Dandy T, NP   7 months ago Cellulitis of right lower extremity   [place] [facility] Mecum, Erin E, PA-C   7 months ago Cellulitis of right lower extremity   [place] [facility] Mecum, Erin E, PA-C   7 months ago Chronic right-sided low back pain, unspecified whether sciatica present   [place] [facility] Mecum, Oswaldo Conroy, PA-C   9 months ago COVID   [place] [facility] [place], Dorie Rank, NP       Future Appointments             In 3 months Cannady, Dorie Rank, NP [place] [facility], [facility]

## 2023-09-29 ENCOUNTER — Telehealth: Payer: Self-pay

## 2023-09-29 NOTE — Telephone Encounter (Addendum)
 Patient was identified as falling into the [place] Measure - Diabetes.   Patient was: Left voicemail to schedule with primary care provider.  He may also schedule a lab only visit for A1C. Please lert me know what he decides.

## 2023-10-05 ENCOUNTER — Other Ambulatory Visit: Payer: Self-pay | Admitting: Nurse Practitioner

## 2023-10-05 DIAGNOSIS — F325 Major depressive disorder, single episode, in full remission: Secondary | ICD-10-CM

## 2023-10-06 NOTE — Telephone Encounter (Signed)
 Requested Prescriptions  Pending Prescriptions Disp Refills   amitriptyline (ELAVIL) 100 MG tablet [Pharmacy Med Name: AMITRIPTYLINE 100 MG TAB] 90 tablet 0    Sig: TAKE ONE TABLET BY MOUTH AT BEDTIME     Psychiatry:  Antidepressants - Heterocyclics (TCAs) Passed - 10/06/2023 10:29 AM      Passed - Completed PHQ-2 or PHQ-9 in the last 360 days      Passed - Valid encounter within last 6 months    Recent Outpatient Visits           3 months ago Chronic bilateral low back pain with right-sided sciatica   [place] [facility] [place], Corrie Dandy T, NP   7 months ago Cellulitis of right lower extremity   [place] [facility] Mecum, Erin E, PA-C   7 months ago Cellulitis of right lower extremity   [place] [facility] Mecum, Erin E, PA-C   8 months ago Chronic right-sided low back pain, unspecified whether sciatica present   [place] [facility] Mecum, Oswaldo Conroy, PA-C   9 months ago COVID   [place] [facility] [place], Corrie Dandy T, NP       Future Appointments             In 2 months Cannady, Dorie Rank, NP [place] [facility], [facility]

## 2023-11-14 ENCOUNTER — Other Ambulatory Visit: Payer: Self-pay | Admitting: Nurse Practitioner

## 2023-11-16 NOTE — Telephone Encounter (Signed)
 Requested Prescriptions  Pending Prescriptions Disp Refills   omeprazole (PRILOSEC) 40 MG capsule [Pharmacy Med Name: OMEPRAZOLE DR 40 MG CAP[**]] 90 capsule 0    Sig: TAKE ONE CAPSULE BY MOUTH ONE TIME DAILY     Gastroenterology: Proton Pump Inhibitors Failed - 11/16/2023  9:50 AM      Failed - Valid encounter within last 12 months    Recent Outpatient Visits   None     Future Appointments             In 1 month Cannady, Dorie Rank, NP [place] [facility]

## 2023-12-10 DIAGNOSIS — E1069 Type 1 diabetes mellitus with other specified complication: Secondary | ICD-10-CM | POA: Diagnosis not present

## 2023-12-14 ENCOUNTER — Other Ambulatory Visit: Payer: Self-pay | Admitting: Nurse Practitioner

## 2023-12-14 DIAGNOSIS — F325 Major depressive disorder, single episode, in full remission: Secondary | ICD-10-CM

## 2023-12-16 DIAGNOSIS — I10 Essential (primary) hypertension: Secondary | ICD-10-CM | POA: Diagnosis not present

## 2023-12-16 DIAGNOSIS — E1022 Type 1 diabetes mellitus with diabetic chronic kidney disease: Secondary | ICD-10-CM | POA: Diagnosis not present

## 2023-12-16 DIAGNOSIS — E1059 Type 1 diabetes mellitus with other circulatory complications: Secondary | ICD-10-CM | POA: Diagnosis not present

## 2023-12-16 DIAGNOSIS — N1831 Chronic kidney disease, stage 3a: Secondary | ICD-10-CM | POA: Diagnosis not present

## 2023-12-16 DIAGNOSIS — E103553 Type 1 diabetes mellitus with stable proliferative diabetic retinopathy, bilateral: Secondary | ICD-10-CM | POA: Diagnosis not present

## 2023-12-16 DIAGNOSIS — R809 Proteinuria, unspecified: Secondary | ICD-10-CM | POA: Diagnosis not present

## 2023-12-16 DIAGNOSIS — E1029 Type 1 diabetes mellitus with other diabetic kidney complication: Secondary | ICD-10-CM | POA: Diagnosis not present

## 2023-12-16 DIAGNOSIS — E1042 Type 1 diabetes mellitus with diabetic polyneuropathy: Secondary | ICD-10-CM | POA: Diagnosis not present

## 2023-12-16 DIAGNOSIS — E1065 Type 1 diabetes mellitus with hyperglycemia: Secondary | ICD-10-CM | POA: Diagnosis not present

## 2023-12-17 ENCOUNTER — Encounter: Payer: Self-pay | Admitting: Nurse Practitioner

## 2023-12-17 NOTE — Telephone Encounter (Signed)
 Requested Prescriptions  Pending Prescriptions Disp Refills   amitriptyline  (ELAVIL ) 100 MG tablet [Pharmacy Med Name: AMITRIPTYLINE  100 MG TAB] 90 tablet 0    Sig: TAKE ONE TABLET BY MOUTH AT BEDTIME     Psychiatry:  Antidepressants - Heterocyclics (TCAs) Failed - 12/17/2023  8:03 AM      Failed - Valid encounter within last 6 months    Recent Outpatient Visits   None     Future Appointments             In 1 week Cannady, Lavelle Posey, NP [place] [facility]            Passed - Completed PHQ-2 or PHQ-9 in the last 360 days

## 2023-12-21 DIAGNOSIS — E109 Type 1 diabetes mellitus without complications: Secondary | ICD-10-CM | POA: Diagnosis not present

## 2023-12-25 LAB — HEMOGLOBIN A1C: Hemoglobin-A1c: 9.4

## 2023-12-25 NOTE — Patient Instructions (Signed)

## 2023-12-27 ENCOUNTER — Ambulatory Visit (INDEPENDENT_AMBULATORY_CARE_PROVIDER_SITE_OTHER): Payer: Self-pay | Admitting: Nurse Practitioner

## 2023-12-27 VITALS — BP 128/68 | HR 88 | Temp 98.0°F | Ht 67.0 in | Wt 252.0 lb

## 2023-12-27 DIAGNOSIS — N400 Enlarged prostate without lower urinary tract symptoms: Secondary | ICD-10-CM

## 2023-12-27 DIAGNOSIS — J4540 Moderate persistent asthma, uncomplicated: Secondary | ICD-10-CM | POA: Diagnosis not present

## 2023-12-27 DIAGNOSIS — K219 Gastro-esophageal reflux disease without esophagitis: Secondary | ICD-10-CM | POA: Diagnosis not present

## 2023-12-27 DIAGNOSIS — F324 Major depressive disorder, single episode, in partial remission: Secondary | ICD-10-CM

## 2023-12-27 DIAGNOSIS — E1059 Type 1 diabetes mellitus with other circulatory complications: Secondary | ICD-10-CM | POA: Diagnosis not present

## 2023-12-27 DIAGNOSIS — E103553 Type 1 diabetes mellitus with stable proliferative diabetic retinopathy, bilateral: Secondary | ICD-10-CM | POA: Diagnosis not present

## 2023-12-27 DIAGNOSIS — E1069 Type 1 diabetes mellitus with other specified complication: Secondary | ICD-10-CM

## 2023-12-27 DIAGNOSIS — I700 Atherosclerosis of aorta: Secondary | ICD-10-CM | POA: Diagnosis not present

## 2023-12-27 DIAGNOSIS — E1051 Type 1 diabetes mellitus with diabetic peripheral angiopathy without gangrene: Secondary | ICD-10-CM

## 2023-12-27 DIAGNOSIS — E1042 Type 1 diabetes mellitus with diabetic polyneuropathy: Secondary | ICD-10-CM | POA: Diagnosis not present

## 2023-12-27 DIAGNOSIS — I152 Hypertension secondary to endocrine disorders: Secondary | ICD-10-CM | POA: Diagnosis not present

## 2023-12-27 DIAGNOSIS — I2510 Atherosclerotic heart disease of native coronary artery without angina pectoris: Secondary | ICD-10-CM

## 2023-12-27 LAB — MICROALBUMIN, URINE WAIVED
Creatinine, Urine Waived: 50 mg/dL (ref 10–300)
Microalb, Ur Waived: 10 mg/L (ref 0–19)

## 2023-12-27 NOTE — Assessment & Plan Note (Signed)
Chronic, ongoing.  Continue collaboration with cardiology, recent notes reviewed.  Denies any recent CP.

## 2023-12-27 NOTE — Assessment & Plan Note (Signed)
 Chronic, ongoing.  BP at goal in office today on recheck.  Recommend he monitor BP at least a few mornings a week at home and document.  DASH diet at home.  Continue current medication regimen and adjust as needed.  Urine ALB 07 November 2022, continue Losartan  for kidney protection.  Labs today: CBC, TSH, CMP.

## 2023-12-27 NOTE — Assessment & Plan Note (Signed)
Chronic, ongoing.  Noted on past imaging.  Continue statin therapy for prevention and heavy focus on diet.

## 2023-12-27 NOTE — Assessment & Plan Note (Signed)
 BMI 39.47 with T1DM and CAD.  Recommended eating smaller high protein, low fat meals more frequently and exercising 30 mins a day 5 times a week with a goal of 10-15lb weight loss in the next 3 months. Patient voiced their understanding and motivation to adhere to these recommendations.

## 2023-12-27 NOTE — Progress Notes (Signed)
 BP 128/68   Pulse 88   Temp 98 F (36.7 C) (Oral)   Ht 5\' 7"  (1.702 m)   Wt 252 lb (114.3 kg)   SpO2 93%   BMI 39.47 kg/m    Subjective:    Patient ID: Mike Kline, male    DOB: 1971-05-31, 53 y.o.   MRN: [id number]  HPI: Mike Kline is a 53 y.o. male  Chief Complaint  Patient presents with   Diabetes   Hypertension   Hyperlipidemia   DIABETES (TYPE 1) Follows with endocrinology, Dr. Lorelei Rogers.  Last visit was 12/16/23 when A1c was 9.4%. On review of chart he continues to refuse pump and is not taking insulin  as instructed all the time due to fear of having lows. Freestyle -- average glucose over 30 days = 216.  Time in Target 30 days: Above 63%, In Target 36%, Below 1%.  Has had 26 lows over past 30 days. 53 years old when diagnosed with diabetes.  Redness to both legs with dry skin at baseline. Saw vascular 12/17/22, deep venous insufficiency.  Does not wear compression hose at home, difficulty getting on. Hypoglycemic episodes: as above Polydipsia/polyuria: no Visual disturbance: no Chest pain: no Paresthesias: no Glucose Monitoring: yes  Accucheck frequency: TID  Fasting glucose:  Post prandial:  Evening:  Before meals: Taking Insulin ?: yes  Long acting insulin : Lantus  50 units  Short acting insulin : Humalog  6-10 units sliding scale Blood Pressure Monitoring: not checking Retinal Examination: Not up to Date -- June 24th Dr. Alto Atta Foot Exam: Up to Date Pneumovax: Up to Date Influenza: Not Up to Date Aspirin : no   HYPERTENSION / HYPERLIPIDEMIA Follows with cardiology 07/02/23, recommend sleep study.  He is now thinking about having sleep study performed. Satisfied with current treatment? yes Duration of hypertension: chronic BP monitoring frequency: not checking BP range:  BP medication side effects: no Duration of hyperlipidemia: chronic Cholesterol medication side effects: no Cholesterol supplements: none Medication compliance: good compliance Aspirin :  no Recent stressors: no Recurrent headaches: no Visual changes: no Palpitations: no Dyspnea: no Chest pain: no Lower extremity edema: no Dizzy/lightheaded: no   ASTHMA Uses Albuterol  as needed. Asthma status: stable Satisfied with current treatment?: yes Albuterol /rescue inhaler frequency: none Dyspnea frequency: no Wheezing frequency: no Cough frequency: no Nocturnal symptom frequency: no Limitation of activity: no Current upper respiratory symptoms: no Triggers: pollen Home peak flows:none Last Spirometry: not recent  Failed/intolerant to following asthma meds: none Asthma meds in past:  Aerochamber/spacer use: no Visits to ER or Urgent Care in past year: no Pneumovax: refuses Influenza: Up to Date   GERD Continues Omeprazole . GERD control status: stable Satisfied with current treatment? yes Heartburn frequency: occasional Medication side effects: no  Medication compliance: stable Dysphagia: no Odynophagia:  no Hematemesis: no Blood in stool: no EGD: yes   BACK PAIN Chronic issue -- ongoing.  Imaging did note degenerative changes on 06/28/23.  Continues Amitriptyline  for mood and sleep.   Duration: days Mechanism of injury: unknown Location: bilateral and low back Onset: gradual Severity: 10/10 at worst Quality: dull, aching, and throbbing Frequency: intermittent Radiation: none Aggravating factors: lifting, movement, and bending Alleviating factors: rest Status: fluctuating Treatments attempted: rest and APAP, BC Powder Relief with NSAIDs?: No NSAIDs Taken Nighttime pain:  no Paresthesias / decreased sensation:  no Bowel / bladder incontinence:  no Fevers:  no Dysuria / urinary frequency:  no      12/27/2023    1:44 PM 06/28/2023    1:59  PM 02/22/2023    1:41 PM 02/08/2023    2:44 PM 12/15/2022    3:53 PM  Depression screen PHQ 2/9  Decreased Interest 2 0 1 0 1  Down, Depressed, Hopeless 0 0 1 0 0  PHQ - 2 Score 2 0 2 0 1  Altered sleeping 2 0  1 3 0  Tired, decreased energy 2 0 1 3 1   Change in appetite 0 0 1 1 0  Feeling bad or failure about yourself  0 0 1 0 0  Trouble concentrating 0 0 0 0 0  Moving slowly or fidgety/restless 0 3 0 0 0  Suicidal thoughts 0 0 0 0 0  PHQ-9 Score 6 3 6 7 2   Difficult doing work/chores Somewhat difficult Not difficult at all Not difficult at all Very difficult Not difficult at all       12/27/2023    1:45 PM 06/28/2023    2:00 PM 02/22/2023    1:41 PM 02/08/2023    2:44 PM  GAD 7 : Generalized Anxiety Score  Nervous, Anxious, on Edge 0 0 0 2  Control/stop worrying 0 0 1 0  Worry too much - different things 0 0 0 1  Trouble relaxing 0 0 0 1  Restless 0 0 0 0  Easily annoyed or irritable 0 0 1 3  Afraid - awful might happen 0 0 0 0  Total GAD 7 Score 0 0 2 7  Anxiety Difficulty Not difficult at all Not difficult at all Not difficult at all Somewhat difficult   Relevant past medical, surgical, family and social history reviewed and updated as indicated. Interim medical history since our last visit reviewed. Allergies and medications reviewed and updated.  Review of Systems  Constitutional:  Negative for activity change, diaphoresis, fatigue and fever.  Respiratory:  Negative for cough, chest tightness, shortness of breath and wheezing.   Cardiovascular:  Negative for chest pain, palpitations and leg swelling.  Gastrointestinal: Negative.   Endocrine: Negative for polydipsia, polyphagia and polyuria.  Musculoskeletal:  Positive for back pain.  Neurological: Negative.   Psychiatric/Behavioral: Negative.      Per HPI unless specifically indicated above     Objective:     BP 128/68   Pulse 88   Temp 98 F (36.7 C) (Oral)   Ht 5\' 7"  (1.702 m)   Wt 252 lb (114.3 kg)   SpO2 93%   BMI 39.47 kg/m   Wt Readings from Last 3 Encounters:  12/27/23 252 lb (114.3 kg)  06/28/23 248 lb (112.5 kg)  02/22/23 236 lb 12.8 oz (107.4 kg)    Physical Exam Vitals and nursing note reviewed.   Constitutional:      General: He is awake. He is not in acute distress.    Appearance: He is well-developed and well-groomed. He is obese. He is not ill-appearing or toxic-appearing.  HENT:     Head: Normocephalic.     Right Ear: Hearing and external ear normal.     Left Ear: Hearing and external ear normal.  Eyes:     General: Lids are normal.     Extraocular Movements: Extraocular movements intact.     Conjunctiva/sclera: Conjunctivae normal.  Neck:     Thyroid : No thyromegaly.     Vascular: No carotid bruit.  Cardiovascular:     Rate and Rhythm: Normal rate and regular rhythm.     Heart sounds: Normal heart sounds. No murmur heard.    No gallop.  Pulmonary:  Effort: No accessory muscle usage or respiratory distress.     Breath sounds: Normal breath sounds.  Abdominal:     General: Bowel sounds are normal. There is no distension.     Palpations: Abdomen is soft.     Tenderness: There is no abdominal tenderness.  Musculoskeletal:     Cervical back: Full passive range of motion without pain.     Right lower leg: No edema.     Left lower leg: No edema.  Lymphadenopathy:     Cervical: No cervical adenopathy.  Skin:    General: Skin is warm.     Capillary Refill: Capillary refill takes less than 2 seconds.  Neurological:     Mental Status: He is alert and oriented to person, place, and time.     Deep Tendon Reflexes: Reflexes are normal and symmetric.     Reflex Scores:      Brachioradialis reflexes are 2+ on the right side and 2+ on the left side.      Patellar reflexes are 2+ on the right side and 2+ on the left side. Psychiatric:        Attention and Perception: Attention normal.        Mood and Affect: Mood normal.        Speech: Speech normal.        Behavior: Behavior normal. Behavior is cooperative.        Thought Content: Thought content normal.    Results for orders placed or performed in visit on 12/27/23  Hemoglobin A1c   Collection Time: 12/10/23 12:00  AM  Result Value Ref Range   Hemoglobin-A1c 9.4%   Microalbumin, Urine Waived   Collection Time: 12/27/23  2:17 PM  Result Value Ref Range   Microalb, Ur Waived 10 0 - 19 mg/L   Creatinine, Urine Waived 50 10 - 300 mg/dL   Microalb/Creat Ratio 30-300 (H) <30 mg/g      Assessment & Plan:   Problem List Items Addressed This Visit       Cardiovascular and Mediastinum   Type 1 diabetes mellitus with peripheral vascular disease (HCC) - Primary   Chronic, ongoing.  A1c recent with endo 9.4% and urine ALB 18 Dec 2023.  Continue collaboration with endo, recent notes reviewed. Continue Amitriptyline  and Gabapentin  as ordered. Discussed with him importance of diabetes control to prevent worsening symptoms or wounds which could lead to loss of limb.  Have highly recommended he look into insulin  pump placement as endo has recommended and discussed at length the risks of poor diabetes control.  He is going to call and schedule follow-up with vascular. - ARB and Statin on board - Needs eye exam.  Foot exam up to date - Vaccinations - refuses flu vaccine      Hypertension associated with type 1 diabetes mellitus (HCC)   Chronic, ongoing.  BP at goal in office today on recheck.  Recommend he monitor BP at least a few mornings a week at home and document.  DASH diet at home.  Continue current medication regimen and adjust as needed.  Urine ALB 07 November 2022, continue Losartan  for kidney protection.  Labs today: CBC, TSH, CMP.         Relevant Orders   Comprehensive metabolic panel with GFR   TSH   Microalbumin, Urine Waived (Completed)   Coronary artery disease involving native coronary artery of native heart without angina pectoris   Chronic, ongoing.  Continue collaboration with cardiology, recent notes reviewed.  Denies any recent CP.        Aortic atherosclerosis (HCC)   Chronic, ongoing.  Noted on past imaging.  Continue statin therapy for prevention and heavy focus on diet.         Respiratory   Moderate asthma without complication   Chronic, stable.  Continue Albuterol  as needed and in future start maintenance inhaler if worsening symptoms.  Return to pulmonary as needed.        Relevant Orders   CBC with Differential/Platelet     Digestive   GERD (gastroesophageal reflux disease)   Chronic, ongoing.  Mag level today.  Continue current medication regimen and adjust as needed.  Risks of PPI use were discussed with patient including bone loss, C. Diff diarrhea, pneumonia, infections, CKD, electrolyte abnormalities.  Verbalizes understanding and chooses to continue the medication.       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  Recommend he attend upcoming eye exam.  A1c recent with endo 9.4% with endo and urine ALB 18 Dec 2023.  Continue collaboration with endo, recent notes reviewed.      Hyperlipidemia due to type 1 diabetes mellitus (HCC)   Chronic, ongoing.  Continue current medication regimen and adjust as needed.  Lipid panel up to date with endo.      DM type 1 with diabetic peripheral neuropathy (HCC)   Chronic, ongoing.  A1c recent with endo 9.4% and urine ALB 18 Dec 2023.  Continue collaboration with endo, recent notes reviewed. Continue Amitriptyline  and Gabapentin  as ordered. Discussed with him importance of diabetes control to prevent worsening symptoms or wounds which could lead to loss of limb.  Have highly recommended he look into insulin  pump placement as endo has recommended and discussed at length the risks of poor diabetes control. - ARB and Statin on board - Needs eye exam.  Foot exam up to date - Vaccinations - refuses flu vaccine         Other   Morbid obesity (HCC)   BMI 39.47 with T1DM and CAD.  Recommended eating smaller high protein, low fat meals more frequently and exercising 30 mins a day 5 times a week with a goal of 10-15lb  weight loss in the next 3 months. Patient voiced their understanding and motivation to adhere to these recommendations.       Depression   Chronic, stable.  Denies SI/HI.  Continue Amitriptyline  and consider SSRI in future, although concern for weight gain with these.  Monitor closely.        Chronic back pain   Chronic, ongoing.  Suspect related to deconditioning. Continue Robaxin  as needed + Lidocaine  patches.  Would benefit PT in future.  Underlying degenerative changes on imaging.      Other Visit Diagnoses       Benign prostatic hyperplasia without lower urinary tract symptoms       PSA on labs today.   Relevant Orders   PSA        Follow up plan: Return in about 6 months (around 06/28/2024) for T2DM, HTN/HLD, BACK PAIN, ASTHMA, MOOD + NEEDS MED WELLNESS WITH NURSE SCHEDULED.

## 2023-12-27 NOTE — Assessment & Plan Note (Signed)
Chronic, ongoing.  Mag level today.  Continue current medication regimen and adjust as needed.  Risks of PPI use were discussed with patient including bone loss, C. Diff diarrhea, pneumonia, infections, CKD, electrolyte abnormalities.  Verbalizes understanding and chooses to continue the medication.

## 2023-12-27 NOTE — Assessment & Plan Note (Signed)
 Chronic, ongoing.  A1c recent with endo 9.4% and urine ALB 18 Dec 2023.  Continue collaboration with endo, recent notes reviewed. Continue Amitriptyline  and Gabapentin  as ordered. Discussed with him importance of diabetes control to prevent worsening symptoms or wounds which could lead to loss of limb.  Have highly recommended he look into insulin  pump placement as endo has recommended and discussed at length the risks of poor diabetes control.  He is going to call and schedule follow-up with vascular. - ARB and Statin on board - Needs eye exam.  Foot exam up to date - Vaccinations - refuses flu vaccine

## 2023-12-27 NOTE — Assessment & Plan Note (Signed)
 Chronic, ongoing.  Suspect related to deconditioning. Continue Robaxin  as needed + Lidocaine  patches.  Would benefit PT in future.  Underlying degenerative changes on imaging.

## 2023-12-27 NOTE — Assessment & Plan Note (Signed)
 Chronic, ongoing.  Continue current medication regimen and adjust as needed.  Lipid panel up to date with endo.

## 2023-12-27 NOTE — Assessment & Plan Note (Signed)
 Chronic, ongoing.  Recommend he attend upcoming eye exam.  A1c recent with endo 9.4% with endo and urine ALB 18 Dec 2023.  Continue collaboration with endo, recent notes reviewed.

## 2023-12-27 NOTE — Assessment & Plan Note (Signed)
 Chronic, ongoing.  A1c recent with endo 9.4% and urine ALB 18 Dec 2023.  Continue collaboration with endo, recent notes reviewed. Continue Amitriptyline  and Gabapentin  as ordered. Discussed with Mike Kline importance of diabetes control to prevent worsening symptoms or wounds which could lead to loss of limb.  Have highly recommended he look into insulin  pump placement as endo has recommended and discussed at length the risks of poor diabetes control. - ARB and Statin on board - Needs eye exam.  Foot exam up to date - Vaccinations - refuses flu vaccine

## 2023-12-27 NOTE — Assessment & Plan Note (Signed)
Chronic, stable.  Continue Albuterol as needed and in future start maintenance inhaler if worsening symptoms.  Return to pulmonary as needed.

## 2023-12-27 NOTE — Assessment & Plan Note (Signed)
Chronic, stable.  Denies SI/HI.  Continue Amitriptyline and consider SSRI in future, although concern for weight gain with these.  Monitor closely.

## 2023-12-28 ENCOUNTER — Encounter (INDEPENDENT_AMBULATORY_CARE_PROVIDER_SITE_OTHER): Payer: Self-pay

## 2023-12-28 ENCOUNTER — Ambulatory Visit: Payer: Self-pay | Admitting: Nurse Practitioner

## 2023-12-28 LAB — CBC WITH DIFFERENTIAL/PLATELET
Basophils Absolute: 0 10*3/uL (ref 0.0–0.2)
Basos: 1 %
EOS (ABSOLUTE): 0.2 10*3/uL (ref 0.0–0.4)
Eos: 3 %
Hematocrit: 41 % (ref 37.5–51.0)
Hemoglobin: 12.9 g/dL — ABNORMAL LOW (ref 13.0–17.7)
Immature Grans (Abs): 0 10*3/uL (ref 0.0–0.1)
Immature Granulocytes: 1 %
Lymphocytes Absolute: 1.4 10*3/uL (ref 0.7–3.1)
Lymphs: 24 %
MCH: 25.6 pg — ABNORMAL LOW (ref 26.6–33.0)
MCHC: 31.5 g/dL (ref 31.5–35.7)
MCV: 82 fL (ref 79–97)
Monocytes Absolute: 0.5 10*3/uL (ref 0.1–0.9)
Monocytes: 8 %
Neutrophils Absolute: 3.7 10*3/uL (ref 1.4–7.0)
Neutrophils: 63 %
Platelets: 266 10*3/uL (ref 150–450)
RBC: 5.03 x10E6/uL (ref 4.14–5.80)
RDW: 14.1 % (ref 11.6–15.4)
WBC: 5.8 10*3/uL (ref 3.4–10.8)

## 2023-12-28 LAB — COMPREHENSIVE METABOLIC PANEL WITH GFR
ALT: 28 IU/L (ref 0–44)
AST: 26 IU/L (ref 0–40)
Albumin: 4.1 g/dL (ref 3.8–4.9)
Alkaline Phosphatase: 159 IU/L — ABNORMAL HIGH (ref 44–121)
BUN/Creatinine Ratio: 18 (ref 9–20)
BUN: 25 mg/dL — ABNORMAL HIGH (ref 6–24)
Bilirubin Total: <0.2 mg/dL (ref 0.0–1.2)
CO2: 24 mmol/L (ref 20–29)
Calcium: 8.6 mg/dL — ABNORMAL LOW (ref 8.7–10.2)
Chloride: 98 mmol/L (ref 96–106)
Creatinine, Ser: 1.42 mg/dL — ABNORMAL HIGH (ref 0.76–1.27)
Globulin, Total: 2.3 g/dL (ref 1.5–4.5)
Glucose: 101 mg/dL — ABNORMAL HIGH (ref 70–99)
Potassium: 4.4 mmol/L (ref 3.5–5.2)
Sodium: 137 mmol/L (ref 134–144)
Total Protein: 6.4 g/dL (ref 6.0–8.5)
eGFR: 59 mL/min/{1.73_m2} — ABNORMAL LOW (ref >59)

## 2023-12-28 LAB — MAGNESIUM: Magnesium: 2.1 mg/dL (ref 1.6–2.3)

## 2023-12-28 LAB — TSH: TSH: 2.11 u[IU]/mL (ref 0.450–4.500)

## 2023-12-28 LAB — PSA: Prostate Specific Ag, Serum: 0.5 ng/mL (ref 0.0–4.0)

## 2023-12-28 NOTE — Progress Notes (Signed)
 Contacted via MyChart   Good afternoon Mike Kline, your labs have returned: - CBC shows very mild decline in hemoglobin which we will watch on blood work.  Ensure you are eating lots of iron rich foods daily. - Kidney function, creatinine and eGFR, shows mild decline this check.  Liver function, AST and ALT normal.  Ensure you have good water intake daily and low salt diet.  Alkaline phosphatase remains a little elevated, at times this looks at gall bladder -- do you still have one? - Remainder of labs are all stable.  Any questions? Keep being stellar!!  Thank you for allowing me to participate in your care.  I appreciate you. Kindest regards, Arabella Revelle

## 2024-01-09 ENCOUNTER — Other Ambulatory Visit: Payer: Self-pay | Admitting: Nurse Practitioner

## 2024-01-09 ENCOUNTER — Encounter: Payer: Self-pay | Admitting: Nurse Practitioner

## 2024-01-09 DIAGNOSIS — E1069 Type 1 diabetes mellitus with other specified complication: Secondary | ICD-10-CM

## 2024-01-09 DIAGNOSIS — F325 Major depressive disorder, single episode, in full remission: Secondary | ICD-10-CM

## 2024-01-09 DIAGNOSIS — E785 Hyperlipidemia, unspecified: Secondary | ICD-10-CM

## 2024-01-10 MED ORDER — AMITRIPTYLINE HCL 100 MG PO TABS: 100.0000 mg | ORAL_TABLET | Freq: Every day | ORAL | 3 refills | Status: AC

## 2024-01-11 NOTE — Telephone Encounter (Signed)
 Requested medication (s) are due for refill today: expired medication date  Requested medication (s) are on the active medication list: yes   Last refill:  10/29/22 #90 4 refills  Future visit scheduled: no   Notes to clinic:  expired medication date do you want to renew Rx?      Requested Prescriptions  Pending Prescriptions Disp Refills   rosuvastatin  (CRESTOR ) 40 MG tablet [Pharmacy Med Name: ROSUVASTATIN  40 MG TAB] 90 tablet 4    Sig: TAKE ONE TABLET BY MOUTH ONE TIME DAILY     Cardiovascular:  Antilipid - Statins 2 Failed - 01/11/2024  8:37 AM      Failed - Cr in normal range and within 360 days    Creatinine, Ser  Date Value Ref Range Status  12/27/2023 1.42 (H) 0.76 - 1.27 mg/dL Final         Failed - Lipid Panel in normal range within the last 12 months    Cholesterol, Total  Date Value Ref Range Status  07/24/2022 136 100 - 199 mg/dL Final   Cholesterol Piccolo, Waived  Date Value Ref Range Status  12/15/2016 103 <200 mg/dL Final    Comment:                            Desirable                <200                         Borderline High      200- 239                         High                     >239    LDL Chol Calc (NIH)  Date Value Ref Range Status  07/24/2022 61 0 - 99 mg/dL Final   HDL  Date Value Ref Range Status  07/24/2022 36 (L) >39 mg/dL Final   Triglycerides  Date Value Ref Range Status  07/24/2022 240 (H) 0 - 149 mg/dL Final   Triglycerides Piccolo,Waived  Date Value Ref Range Status  12/15/2016 85 <150 mg/dL Final    Comment:                            Normal                   <150                         Borderline High     150 - 199                         High                200 - 499                         Very High                >499          Passed - Patient is not pregnant      Passed - Valid encounter within last 12 months    Recent Outpatient Visits  2 weeks ago Type 1 diabetes mellitus with peripheral  vascular disease (HCC)   [facility] Brun[place], Lavelle Posey, NP

## 2024-01-21 DIAGNOSIS — H26491 Other secondary cataract, right eye: Secondary | ICD-10-CM | POA: Diagnosis not present

## 2024-01-21 DIAGNOSIS — E1339 Other specified diabetes mellitus with other diabetic ophthalmic complication: Secondary | ICD-10-CM | POA: Diagnosis not present

## 2024-01-21 LAB — HM DIABETES EYE EXAM

## 2024-01-24 ENCOUNTER — Telehealth: Payer: Self-pay | Admitting: Nurse Practitioner

## 2024-01-24 NOTE — Telephone Encounter (Signed)
 Copied from CRM [id number]. Topic: Medicare AWV >> Jan 24, 2024  9:20 AM Juliana Ocean wrote: Reason for CRM: LVM 01/24/2024 reminder call for AWV appt 01/25/24 @8am    Rosalee Collins; Care Guide Ambulatory Clinical Support [place] [facility] Direct Dial: [phone]

## 2024-01-25 ENCOUNTER — Ambulatory Visit: Admitting: Emergency Medicine

## 2024-01-25 VITALS — Ht 67.0 in | Wt 250.0 lb

## 2024-01-25 DIAGNOSIS — Z0000 Encounter for general adult medical examination without abnormal findings: Secondary | ICD-10-CM

## 2024-01-25 DIAGNOSIS — Z1211 Encounter for screening for malignant neoplasm of colon: Secondary | ICD-10-CM

## 2024-01-25 NOTE — Patient Instructions (Signed)
 Mike Kline , Thank you for taking time out of your busy schedule to complete your Annual Wellness Visit with me. I enjoyed our conversation and look forward to speaking with you again next year. I, as well as your care team,  appreciate your ongoing commitment to your health goals. Please review the following plan we discussed and let me know if I can assist you in the future. Your Game plan/ To Do List    Referrals: None  Follow up Visits: Next Medicare AWV with our clinical staff: 01/30/25 @ 8:00am (PHONE VISIT)   Have you seen your provider in the last 6 months (3 months if uncontrolled diabetes)? Yes Next Office Visit with your provider: 06/28/24 @ 1:40pm with Jolene Cannady, NP  Clinician Recommendations: I have placed an order for a cologuard (screening for colon cancer) kit to be mailed to your home. Complete and return at your earliest convenience. Call [facility] @ [phone] to schedule an appointment. Aim for 30 minutes of exercise or brisk walking, 6-8 glasses of water, and 5 servings of fruits and vegetables each day.       This is a list of the screening recommended for you and due dates:  Health Maintenance  Topic Date Due   Eye exam for diabetics  Never done   COVID-19 Vaccine (2 - Pfizer risk series) 10/08/2020   Zoster (Shingles) Vaccine (1 of 2) 03/26/2024*   Colon Cancer Screening  06/27/2024*   Pneumococcal Vaccination (2 of 2 - PPSV23) 12/26/2024*   Flu Shot  03/10/2024   Hemoglobin A1C  06/11/2024   Yearly kidney function blood test for diabetes  12/26/2024   Yearly kidney health urinalysis for diabetes  12/26/2024   Complete foot exam   12/26/2024   Medicare Annual Wellness Visit  01/24/2025   DTaP/Tdap/Td vaccine (3 - Tdap) 10/28/2032   Hepatitis C Screening  Completed   HIV Screening  Completed   HPV Vaccine  Aged Out   Meningitis B Vaccine  Aged Out  *Topic was postponed. The date shown is not the original due date.    Advanced directives: (ACP  Link)Information on Advanced Care Planning can be found at [place]  Secretary of [facility] Advance Health Care Directives Advance Health Care Directives. [email or web address] You may also get these forms from your doctor's office. Advance Care Planning is important because it:  [x]  Makes sure you receive the medical care that is consistent with your values, goals, and preferences  [x]  It provides guidance to your family and loved ones and reduces their decisional burden about whether or not they are making the right decisions based on your wishes.  Follow the link provided in your after visit summary or read over the paperwork we have mailed to you to help you started getting your Advance Directives in place. If you need assistance in completing these, please reach out to us  so that we can help you!  See attachments for Preventive Care and Fall Prevention Tips.   Fall Prevention in the Home, Adult Falls can cause injuries and affect people of all ages. There are many simple things that you can do to make your home safe and to help prevent falls. If you need it, ask for help making these changes. What actions can I take to prevent falls? General information Use good lighting in all rooms. Make sure to: Replace any light bulbs that burn out. Turn on lights if it is dark and use night-lights. Keep items that you use often  in easy-to-reach places. Lower the shelves around your home if needed. Move furniture so that there are clear paths around it. Do not keep throw rugs or other things on the floor that can make you trip. If any of your floors are uneven, fix them. Add color or contrast paint or tape to clearly mark and help you see: Grab bars or handrails. First and last steps of staircases. Where the edge of each step is. If you use a ladder or stepladder: Make sure that it is fully opened. Do not climb a closed ladder. Make sure the sides of the ladder are locked in place. Have someone hold the  ladder while you use it. Know where your pets are as you move through your home. What can I do in the bathroom?     Keep the floor dry. Clean up any water that is on the floor right away. Remove soap buildup in the bathtub or shower. Buildup makes bathtubs and showers slippery. Use non-skid mats or decals on the floor of the bathtub or shower. Attach bath mats securely with double-sided, non-slip rug tape. If you need to sit down while you are in the shower, use a non-slip stool. Install grab bars by the toilet and in the bathtub and shower. Do not use towel bars as grab bars. What can I do in the bedroom? Make sure that you have a light by your bed that is easy to reach. Do not use any sheets or blankets on your bed that hang to the floor. Have a firm bench or chair with side arms that you can use for support when you get dressed. What can I do in the kitchen? Clean up any spills right away. If you need to reach something above you, use a sturdy step stool that has a grab bar. Keep electrical cables out of the way. Do not use floor polish or wax that makes floors slippery. What can I do with my stairs? Do not leave anything on the stairs. Make sure that you have a light switch at the top and the bottom of the stairs. Have them installed if you do not have them. Make sure that there are handrails on both sides of the stairs. Fix handrails that are broken or loose. Make sure that handrails are as long as the staircases. Install non-slip stair treads on all stairs in your home if they do not have carpet. Avoid having throw rugs at the top or bottom of stairs, or secure the rugs with carpet tape to prevent them from moving. Choose a carpet design that does not hide the edge of steps on the stairs. Make sure that carpet is firmly attached to the stairs. Fix any carpet that is loose or worn. What can I do on the outside of my home? Use bright outdoor lighting. Repair the edges of walkways  and driveways and fix any cracks. Clear paths of anything that can make you trip, such as tools or rocks. Add color or contrast paint or tape to clearly mark and help you see high doorway thresholds. Trim any bushes or trees on the main path into your home. Check that handrails are securely fastened and in good repair. Both sides of all steps should have handrails. Install guardrails along the edges of any raised decks or porches. Have leaves, snow, and ice cleared regularly. Use sand, salt, or ice melt on walkways during winter months if you live where there is ice and snow. In the  garage, clean up any spills right away, including grease or oil spills. What other actions can I take? Review your medicines with your health care provider. Some medicines can make you confused or feel dizzy. This can increase your chance of falling. Wear closed-toe shoes that fit well and support your feet. Wear shoes that have rubber soles and low heels. Use a cane, walker, scooter, or crutches that help you move around if needed. Talk with your provider about other ways that you can decrease your risk of falls. This may include seeing a physical therapist to learn to do exercises to improve movement and strength. Where to find more information Centers for Disease Control and Prevention, STEADI: TonerPromos.no [employer] on Aging: BaseRingTones.pl National Institute on Aging: BaseRingTones.pl Contact a health care provider if: You are afraid of falling at home. You feel weak, drowsy, or dizzy at home. You fall at home. Get help right away if you: Lose consciousness or have trouble moving after a fall. Have a fall that causes a head injury. These symptoms may be an emergency. Get help right away. Call 911. Do not wait to see if the symptoms will go away. Do not drive yourself to the hospital. This information is not intended to replace advice given to you by your health care provider. Make sure you discuss any questions  you have with your health care provider. Document Revised: 03/30/2022 Document Reviewed: 03/30/2022 Elsevier Patient Education  2024 [employer].

## 2024-01-25 NOTE — Progress Notes (Signed)
 Subjective:   Mike Kline is a 53 y.o. who presents for a Medicare Wellness preventive visit.  As a reminder, Annual Wellness Visits don't include a physical exam, and some assessments may be limited, especially if this visit is performed virtually. We may recommend an in-person follow-up visit with your provider if needed.  Visit Complete: Virtual I connected with  Mike Kline on 01/25/24 by a audio enabled telemedicine application and verified that I am speaking with the correct person using two identifiers.  Patient Location: Home  Provider Location: Home Office  I discussed the limitations of evaluation and management by telemedicine. The patient expressed understanding and agreed to proceed.  Vital Signs: Because this visit was a virtual/telehealth visit, some criteria may be missing or patient reported. Any vitals not documented were not able to be obtained and vitals that have been documented are patient reported.  VideoDeclined- This patient declined Librarian, academic. Therefore the visit was completed with audio only.  Persons Participating in Visit: Patient.  AWV Questionnaire: No: Patient Medicare AWV questionnaire was not completed prior to this visit.  Cardiac Risk Factors include: male gender;diabetes mellitus;hypertension;dyslipidemia;obesity (BMI >30kg/m2)     Objective:    Today's Vitals   01/25/24 0757  Weight: 250 lb (113.4 kg)  Height: 5' 7 (1.702 m)   Body mass index is 39.16 kg/m.     01/25/2024    8:15 AM 11/15/2021   11:28 AM 01/11/2021    6:20 PM 10/22/2020    3:56 AM 07/29/2020    9:07 AM 06/13/2020   10:00 AM 05/18/2020    5:39 PM  Advanced Directives  Does Patient Have a Medical Advance Directive? No No No No No No No  Would patient like information on creating a medical advance directive? Yes (MAU/Ambulatory/Procedural Areas - Information given)  No - Patient declined   Yes (Inpatient - patient requests chaplain  consult to create a medical advance directive) No - Patient declined    Current Medications (verified) Outpatient Encounter Medications as of 01/25/2024  Medication Sig   amitriptyline  (ELAVIL ) 100 MG tablet Take 1 tablet (100 mg total) by mouth at bedtime.   cetirizine  (ZYRTEC ) 10 MG tablet Take 10 mg by mouth daily.   Continuous Blood Gluc Sensor (FREESTYLE LIBRE 2 SENSOR) MISC PLACE 1 TO THE BACK OF YOUR UPPER ARM, REPLACE EVERY 14 DAYS   gabapentin  (NEURONTIN ) 300 MG capsule Take 1 capsule (300 mg total) by mouth 3 (three) times daily.   HUMALOG  KWIKPEN 100 UNIT/ML KiwkPen USE THREE TIMES A DAY AS DIRECTED.(UP TO 50 UNITS A DAY OR AS DIRECTED BY SLIDING SCALE) (Patient taking differently: 6-10 units three times a day per sliding scale)   insulin  glargine (LANTUS  SOLOSTAR) 100 UNIT/ML Solostar Pen Inject 40 Units into the skin daily at 10 pm. (Patient taking differently: Inject 50 Units into the skin daily at 10 pm.)   losartan  (COZAAR ) 50 MG tablet TAKE ONE TABLET BY MOUTH ONE TIME DAILY   melatonin 5 MG TABS Take 15 mg by mouth at bedtime as needed.   metoprolol  tartrate (LOPRESSOR ) 50 MG tablet Take 50 mg by mouth 2 (two) times daily.   omeprazole  (PRILOSEC) 40 MG capsule TAKE ONE CAPSULE BY MOUTH ONE TIME DAILY   potassium chloride SA (KLOR-CON M) 20 MEQ tablet Take 20 mEq by mouth 2 (two) times daily.   rosuvastatin  (CRESTOR ) 40 MG tablet TAKE ONE TABLET BY MOUTH ONE TIME DAILY   torsemide  (DEMADEX ) 20 MG  tablet Take 2 tablets (40 mg total) by mouth 2 (two) times daily.   lidocaine  (LIDODERM ) 5 % Place 1 patch onto the skin daily. Remove & Discard patch within 12 hours or as directed by MD (Patient not taking: Reported on 01/25/2024)   methocarbamol  (ROBAXIN ) 750 MG tablet TAKE ONE TABLET BY MOUTH EVERY 8 HOURS AS NEEDED FOR MUSCLE SPASMS (Patient not taking: Reported on 01/25/2024)   No facility-administered encounter medications on file as of 01/25/2024.    Allergies  (verified) Patient has no known allergies.   History: Past Medical History:  Diagnosis Date   Adhesive capsulitis    permanent disability 07/2010, left shoulder   Anginal pain (HCC)    Anxiety [place]   Blindness of left eye    Cataract 2010   Fixed except blind in left eye   Diabetes mellitus without complication (HCC)    Diabetic, retinopathy, proliferative (HCC)    GERD (gastroesophageal reflux disease)    Hyperlipidemia    Hypertension    Myocardial infarction (HCC) 06/02/2019   Quadruple bypass   Past Surgical History:  Procedure Laterality Date   APPENDECTOMY     CABG x4  06/02/2019   [place]   CARPAL TUNNEL RELEASE Bilateral    cataract surgery Bilateral 2014   CORONARY ARTERY BYPASS GRAFT  06/02/2019   Quadruple bypass   EYE SURGERY Left    LAA clip Left 06/02/2019   [place]   LEFT HEART CATH AND CORONARY ANGIOGRAPHY Left 05/22/2019   Procedure: LEFT HEART CATH AND CORONARY ANGIOGRAPHY;  Surgeon: Antonette Batters, MD;  Location: [facility] INVASIVE CV LAB;  Service: Cardiovascular;  Laterality: Left;   SHOULDER SURGERY Left 2009   Family History  Problem Relation Age of Onset   Bone cancer Mother    Social History   Socioeconomic History   Marital status: Divorced    Spouse name: Not on file   Number of children: 3   Years of education: Not on file   Highest education level: 12th grade  Occupational History   Occupation: disability  Tobacco Use   Smoking status: Never   Smokeless tobacco: Never   Tobacco comments:    Never  Vaping Use   Vaping status: Never Used  Substance and Sexual Activity   Alcohol use: Not Currently    Comment: socially   Drug use: No   Sexual activity: Not on file  Other Topics Concern   Not on file  Social History Narrative   Not on file   Social Drivers of Health   Financial Resource Strain: Low Risk  (01/25/2024)   Overall Financial Resource Strain (CARDIA)    Difficulty of Paying Living Expenses: Not hard at  all  Food Insecurity: No Food Insecurity (01/25/2024)   Hunger Vital Sign    Worried About Running Out of Food in the Last Year: Never true    Ran Out of Food in the Last Year: Never true  Transportation Needs: No Transportation Needs (01/25/2024)   PRAPARE - Administrator, Civil Service (Medical): No    Lack of Transportation (Non-Medical): No  Physical Activity: Inactive (01/25/2024)   Exercise Vital Sign    Days of Exercise per Week: 0 days    Minutes of Exercise per Session: 0 min  Stress: Stress Concern Present (01/25/2024)   [employer] of Occupational Health - Occupational Stress Questionnaire    Feeling of Stress: To some extent  Social Connections: Moderately Integrated (01/25/2024)   Social Connection  and Isolation Panel    Frequency of Communication with Friends and Family: More than three times a week    Frequency of Social Gatherings with Friends and Family: Once a week    Attends Religious Services: 1 to 4 times per year    Active Member of [employer] or Organizations: No    Attends Banker Meetings: Never    Marital Status: Living with partner  Recent Concern: Social Connections - Moderately Isolated (12/27/2023)   Social Connection and Isolation Panel    Frequency of Communication with Friends and Family: More than three times a week    Frequency of Social Gatherings with Friends and Family: Once a week    Attends Religious Services: Never    Database administrator or Organizations: No    Attends Engineer, structural: Not on file    Marital Status: Living with partner    Tobacco Counseling Counseling given: Not Answered Tobacco comments: Never    Clinical Intake:  Pre-visit preparation completed: Yes  Pain : No/denies pain     BMI - recorded: 39.16 Nutritional Status: BMI > 30  Obese Nutritional Risks: None Diabetes: Yes CBG done?: No (FBS 133 per patient) Did pt. bring in CBG monitor from home?: No  Lab Results   Component Value Date   HGBA1C 9.4% 12/10/2023   HGBA1C 11.3 (H) 07/17/2021   HGBA1C 10.5 (H) 10/23/2020     How often do you need to have someone help you when you read instructions, pamphlets, or other written materials from your doctor or pharmacy?: 1 - Never  Interpreter Needed?: No  Information entered by :: Jaunita Messier, CMA   Activities of Daily Living     01/25/2024    8:03 AM  In your present state of health, do you have any difficulty performing the following activities:  Hearing? 0  Vision? 0  Difficulty concentrating or making decisions? 0  Walking or climbing stairs? 1  Comment uses cane  Dressing or bathing? 0  Doing errands, shopping? 0  Preparing Food and eating ? N  Using the Toilet? N  In the past six months, have you accidently leaked urine? N  Do you have problems with loss of bowel control? N  Managing your Medications? N  Managing your Finances? N  Housekeeping or managing your Housekeeping? N    Patient Care Team: Cannady, Jolene T, NP as PCP - General (Nurse Practitioner) Julia Oats, OD (Optometry) Solum, Nicolas Barren, MD as Physician Assistant (Endocrinology) Antonette Batters, MD as Consulting Physician (Cardiology) Valene Gash, NP as Nurse Practitioner (Vascular Surgery)  I have updated your Care Teams any recent Medical Services you may have received from other providers in the past year.     Assessment:   This is a routine wellness examination for Mike Kline.  Hearing/Vision screen Hearing Screening - Comments:: Denies hearing loss Vision Screening - Comments:: Gets DM eye exam, Dr. Violet Grew [place]   Goals Addressed             This Visit's Progress    Patient Stated       Lose weight and get back to 190lb       Depression Screen     01/25/2024    8:12 AM 12/27/2023    1:44 PM 06/28/2023    1:59 PM 02/22/2023    1:41 PM 02/08/2023    2:44 PM 12/15/2022    3:53 PM 10/29/2022   10:20 AM  PHQ 2/9  Scores  PHQ - 2 Score 0 2  0 2 0 1 0  PHQ- 9 Score 3 6 3 6 7 2 3     Fall Risk     01/25/2024    8:17 AM 02/22/2023    1:40 PM 02/08/2023    2:43 PM 10/29/2022   10:20 AM 07/24/2022   10:19 AM  Fall Risk   Falls in the past year? 0 1 1 0 0  Number falls in past yr: 0 0 0 0 0  Injury with Fall? 0 1 1 0 0  Risk for fall due to : Impaired balance/gait;Impaired mobility;Impaired vision History of fall(s) History of fall(s);Impaired balance/gait No Fall Risks No Fall Risks  Follow up Falls evaluation completed;Education provided Falls evaluation completed Falls evaluation completed Falls evaluation completed Falls evaluation completed      Data saved with a previous flowsheet row definition    MEDICARE RISK AT HOME:  Medicare Risk at Home Any stairs in or around the home?: Yes If so, are there any without handrails?: Yes Home free of loose throw rugs in walkways, pet beds, electrical cords, etc?: Yes Adequate lighting in your home to reduce risk of falls?: Yes Life alert?: No Use of a cane, walker or w/c?: Yes (cane) Grab bars in the bathroom?: Yes Shower chair or bench in shower?: Yes Elevated toilet seat or a handicapped toilet?: No  TIMED UP AND GO:  Was the test performed?  No  Cognitive Function: 6CIT completed        01/25/2024    8:18 AM 10/29/2022   12:41 PM 07/29/2020    9:11 AM  6CIT Screen  What Year? 0 points 0 points 0 points  What month? 0 points 0 points 0 points  What time? 0 points 0 points 0 points  Count back from 20 0 points 0 points 0 points  Months in reverse 4 points 0 points 2 points  Repeat phrase 0 points 0 points 0 points  Total Score 4 points 0 points 2 points    Immunizations Immunization History  Administered Date(s) Administered   Influenza,inj,Quad PF,6+ Mos 05/28/2015, 04/14/2019, 04/24/2021   Influenza-Unspecified 05/15/2014, 04/23/2019, 06/14/2020   PFIZER(Purple Top)SARS-COV-2 Vaccination 09/17/2020   Pneumococcal Conjugate-13 10/17/2020    Pneumococcal-Unspecified 03/05/2010   Td 09/20/2008, 10/29/2022    Screening Tests Health Maintenance  Topic Date Due   OPHTHALMOLOGY EXAM  Never done   COVID-19 Vaccine (2 - Pfizer risk series) 10/08/2020   Zoster Vaccines- Shingrix (1 of 2) 03/26/2024 (Originally 02/12/1990)   Colonoscopy  06/27/2024 (Originally 02/11/2014)   Pneumococcal Vaccine 19-76 Years old (2 of 2 - PPSV23) 12/26/2024 (Originally 12/12/2020)   INFLUENZA VACCINE  03/10/2024   HEMOGLOBIN A1C  06/11/2024   Diabetic kidney evaluation - eGFR measurement  12/26/2024   Diabetic kidney evaluation - Urine ACR  12/26/2024   FOOT EXAM  12/26/2024   Medicare Annual Wellness (AWV)  01/24/2025   DTaP/Tdap/Td (3 - Tdap) 10/28/2032   Hepatitis C Screening  Completed   HIV Screening  Completed   HPV VACCINES  Aged Out   Meningococcal B Vaccine  Aged Out    Health Maintenance  Health Maintenance Due  Topic Date Due   OPHTHALMOLOGY EXAM  Never done   COVID-19 Vaccine (2 - Pfizer risk series) 10/08/2020   Health Maintenance Items Addressed: See Nurse Notes at the end of this note  Additional Screening:  Vision Screening: Recommended annual ophthalmology exams for early detection of glaucoma and other disorders of  the eye. Would you like a referral to an eye doctor? No    Dental Screening: Recommended annual dental exams for proper oral hygiene  Community Resource Referral / Chronic Care Management: CRR required this visit?  No   CCM required this visit?  No   Plan:    I have personally reviewed and noted the following in the patient's chart:   Medical and social history Use of alcohol, tobacco or illicit drugs  Current medications and supplements including opioid prescriptions. Patient is not currently taking opioid prescriptions. Functional ability and status Nutritional status Physical activity Advanced directives List of other physicians Hospitalizations, surgeries, and ER visits in previous 12  months Vitals Screenings to include cognitive, depression, and falls Referrals and appointments  In addition, I have reviewed and discussed with patient certain preventive protocols, quality metrics, and best practice recommendations. A written personalized care plan for preventive services as well as general preventive health recommendations were provided to patient.   Jaunita Messier, CMA   01/25/2024   After Visit Summary: (MyChart) Due to this being a telephonic visit, the after visit summary with patients personalized plan was offered to patient via MyChart   Notes:  6 CIT Score - 4 FBS this morning 133 per patient Requested DM eye exam from Dr. Lennart Quitter office Placed order for cologuard (declined screening colonosocpy) Declined referral to DM & Nutrition education Declined pneumonia, covid and shingles vaccines

## 2024-01-27 ENCOUNTER — Telehealth: Payer: Self-pay | Admitting: Nurse Practitioner

## 2024-01-27 DIAGNOSIS — Z1211 Encounter for screening for malignant neoplasm of colon: Secondary | ICD-10-CM

## 2024-01-27 NOTE — Telephone Encounter (Signed)
 Copied from CRM [id number]. Topic: General - Other >> Jan 27, 2024 11:11 AM Sanjuana Crutch wrote: Reason for CRM: patient calling about colonoscopy kit , says he got a test kit but instead of the test kit he wants a regular colonoscopy if possible. Asking what needs to be done now to schedule one.

## 2024-01-27 NOTE — Telephone Encounter (Signed)
 Can we enter a referral for a colonoscopy please?

## 2024-01-27 NOTE — Telephone Encounter (Signed)
 Called and LVM letting patient know that referral has been entered for the colonoscopy.

## 2024-01-31 DIAGNOSIS — E782 Mixed hyperlipidemia: Secondary | ICD-10-CM | POA: Diagnosis not present

## 2024-01-31 DIAGNOSIS — Z951 Presence of aortocoronary bypass graft: Secondary | ICD-10-CM | POA: Diagnosis not present

## 2024-01-31 DIAGNOSIS — R0602 Shortness of breath: Secondary | ICD-10-CM | POA: Diagnosis not present

## 2024-01-31 DIAGNOSIS — I10 Essential (primary) hypertension: Secondary | ICD-10-CM | POA: Diagnosis not present

## 2024-01-31 DIAGNOSIS — I2510 Atherosclerotic heart disease of native coronary artery without angina pectoris: Secondary | ICD-10-CM | POA: Diagnosis not present

## 2024-01-31 DIAGNOSIS — I2583 Coronary atherosclerosis due to lipid rich plaque: Secondary | ICD-10-CM | POA: Diagnosis not present

## 2024-01-31 DIAGNOSIS — G4733 Obstructive sleep apnea (adult) (pediatric): Secondary | ICD-10-CM | POA: Diagnosis not present

## 2024-01-31 DIAGNOSIS — E108 Type 1 diabetes mellitus with unspecified complications: Secondary | ICD-10-CM | POA: Diagnosis not present

## 2024-01-31 DIAGNOSIS — R079 Chest pain, unspecified: Secondary | ICD-10-CM | POA: Diagnosis not present

## 2024-01-31 DIAGNOSIS — Z87891 Personal history of nicotine dependence: Secondary | ICD-10-CM | POA: Diagnosis not present

## 2024-01-31 DIAGNOSIS — R600 Localized edema: Secondary | ICD-10-CM | POA: Diagnosis not present

## 2024-02-04 DIAGNOSIS — H26491 Other secondary cataract, right eye: Secondary | ICD-10-CM | POA: Diagnosis not present

## 2024-02-05 ENCOUNTER — Encounter: Payer: Self-pay | Admitting: Nurse Practitioner

## 2024-02-07 ENCOUNTER — Other Ambulatory Visit: Payer: Self-pay | Admitting: Internal Medicine

## 2024-02-07 DIAGNOSIS — R079 Chest pain, unspecified: Secondary | ICD-10-CM

## 2024-02-07 DIAGNOSIS — R0602 Shortness of breath: Secondary | ICD-10-CM

## 2024-02-07 DIAGNOSIS — I2510 Atherosclerotic heart disease of native coronary artery without angina pectoris: Secondary | ICD-10-CM

## 2024-02-12 ENCOUNTER — Other Ambulatory Visit: Payer: Self-pay | Admitting: Nurse Practitioner

## 2024-02-15 NOTE — Telephone Encounter (Signed)
 Requested Prescriptions  Pending Prescriptions Disp Refills   omeprazole  (PRILOSEC) 40 MG capsule [Pharmacy Med Name: OMEPRAZOLE  DR 40 MG CAP[*]] 90 capsule 1    Sig: TAKE ONE CAPSULE BY MOUTH ONE TIME DAILY     Gastroenterology: Proton Pump Inhibitors Passed - 02/15/2024  2:08 PM      Passed - Valid encounter within last 12 months    Recent Outpatient Visits           1 month ago Type 1 diabetes mellitus with peripheral vascular disease (HCC)   [facility] [place], Melanie DASEN, NP

## 2024-02-18 ENCOUNTER — Telehealth (HOSPITAL_COMMUNITY): Payer: Self-pay | Admitting: Emergency Medicine

## 2024-02-18 NOTE — Telephone Encounter (Signed)
 Attempted to call patient regarding upcoming cardiac CT appointment. Left message on voicemail with name and callback number Rockwell Alexandria RN Navigator Cardiac Imaging [facility] [phone] Office [phone] Cell

## 2024-02-21 ENCOUNTER — Ambulatory Visit
Admission: RE | Admit: 2024-02-21 | Discharge: 2024-02-21 | Disposition: A | Discharge status: Home / Self Care | Source: Ambulatory Visit | Attending: Internal Medicine | Admitting: Internal Medicine

## 2024-02-21 DIAGNOSIS — I2510 Atherosclerotic heart disease of native coronary artery without angina pectoris: Secondary | ICD-10-CM | POA: Diagnosis not present

## 2024-02-21 DIAGNOSIS — R0602 Shortness of breath: Secondary | ICD-10-CM | POA: Insufficient documentation

## 2024-02-21 DIAGNOSIS — R079 Chest pain, unspecified: Secondary | ICD-10-CM | POA: Diagnosis not present

## 2024-02-21 LAB — POCT I-STAT CREATININE: Creatinine, Ser: 1.7 mg/dL — ABNORMAL HIGH (ref 0.61–1.24)

## 2024-02-21 MED ORDER — IOHEXOL 350 MG/ML SOLN
100.0000 mL | Freq: Once | INTRAVENOUS | Status: AC | PRN
  Administered 2024-02-21: 100 mL via INTRAVENOUS

## 2024-02-21 MED ORDER — METOPROLOL TARTRATE 5 MG/5ML IV SOLN
INTRAVENOUS | Status: AC
  Filled 2024-02-21: qty 10

## 2024-02-21 MED ORDER — METOPROLOL TARTRATE 5 MG/5ML IV SOLN
10.0000 mg | Freq: Once | INTRAVENOUS | Status: AC | PRN
  Administered 2024-02-21: 10 mg via INTRAVENOUS
  Filled 2024-02-21: qty 10

## 2024-02-21 MED ORDER — DILTIAZEM HCL 25 MG/5ML IV SOLN
10.0000 mg | INTRAVENOUS | Status: DC | PRN
  Filled 2024-02-21: qty 5

## 2024-02-21 MED ORDER — NITROGLYCERIN 0.4 MG SL SUBL
0.8000 mg | SUBLINGUAL_TABLET | Freq: Once | SUBLINGUAL | Status: AC
  Administered 2024-02-21: 0.8 mg via SUBLINGUAL
  Filled 2024-02-21: qty 25

## 2024-02-21 NOTE — Progress Notes (Signed)
 Patient tolerated procedure well. Ambulate w/o difficulty. Denies any lightheadedness or being dizzy. Pt denies any pain at this time. Sitting in chair. Pt is encouraged to drink additional water throughout the day and reason explained to patient. Patient verbalized understanding and all questions answered. ABC intact. No further needs at this time. Discharge from procedure area w/o issues.

## 2024-02-22 DIAGNOSIS — I2510 Atherosclerotic heart disease of native coronary artery without angina pectoris: Secondary | ICD-10-CM | POA: Diagnosis not present

## 2024-02-22 DIAGNOSIS — R0602 Shortness of breath: Secondary | ICD-10-CM | POA: Diagnosis not present

## 2024-02-22 DIAGNOSIS — R0789 Other chest pain: Secondary | ICD-10-CM | POA: Diagnosis not present

## 2024-02-23 DIAGNOSIS — H26491 Other secondary cataract, right eye: Secondary | ICD-10-CM | POA: Diagnosis not present

## 2024-03-10 DIAGNOSIS — E782 Mixed hyperlipidemia: Secondary | ICD-10-CM | POA: Diagnosis not present

## 2024-03-10 DIAGNOSIS — Z951 Presence of aortocoronary bypass graft: Secondary | ICD-10-CM | POA: Diagnosis not present

## 2024-03-10 DIAGNOSIS — G4733 Obstructive sleep apnea (adult) (pediatric): Secondary | ICD-10-CM | POA: Diagnosis not present

## 2024-03-10 DIAGNOSIS — Z87891 Personal history of nicotine dependence: Secondary | ICD-10-CM | POA: Diagnosis not present

## 2024-03-10 DIAGNOSIS — I10 Essential (primary) hypertension: Secondary | ICD-10-CM | POA: Diagnosis not present

## 2024-03-10 DIAGNOSIS — I2583 Coronary atherosclerosis due to lipid rich plaque: Secondary | ICD-10-CM | POA: Diagnosis not present

## 2024-03-10 DIAGNOSIS — E108 Type 1 diabetes mellitus with unspecified complications: Secondary | ICD-10-CM | POA: Diagnosis not present

## 2024-03-10 DIAGNOSIS — R079 Chest pain, unspecified: Secondary | ICD-10-CM | POA: Diagnosis not present

## 2024-03-10 DIAGNOSIS — I2510 Atherosclerotic heart disease of native coronary artery without angina pectoris: Secondary | ICD-10-CM | POA: Diagnosis not present

## 2024-03-10 DIAGNOSIS — R0602 Shortness of breath: Secondary | ICD-10-CM | POA: Diagnosis not present

## 2024-03-10 DIAGNOSIS — R600 Localized edema: Secondary | ICD-10-CM | POA: Diagnosis not present

## 2024-03-11 ENCOUNTER — Other Ambulatory Visit: Payer: Self-pay | Admitting: Nurse Practitioner

## 2024-03-11 DIAGNOSIS — F325 Major depressive disorder, single episode, in full remission: Secondary | ICD-10-CM

## 2024-03-13 ENCOUNTER — Encounter: Payer: Self-pay | Admitting: Nurse Practitioner

## 2024-03-13 DIAGNOSIS — E1042 Type 1 diabetes mellitus with diabetic polyneuropathy: Secondary | ICD-10-CM

## 2024-03-13 NOTE — Telephone Encounter (Signed)
 Requested Prescriptions  Refused Prescriptions Disp Refills   amitriptyline  (ELAVIL ) 100 MG tablet [Pharmacy Med Name: AMITRIPTYLINE  100 MG TAB] 90 tablet 0    Sig: TAKE ONE TABLET BY MOUTH AT BEDTIME     Psychiatry:  Antidepressants - Heterocyclics (TCAs) Passed - 03/13/2024  5:15 PM      Passed - Completed PHQ-2 or PHQ-9 in the last 360 days      Passed - Valid encounter within last 6 months    Recent Outpatient Visits           2 months ago Type 1 diabetes mellitus with peripheral vascular disease (HCC)   [facility] [place], Melanie DASEN, NP

## 2024-03-14 MED ORDER — GABAPENTIN 300 MG PO CAPS: 900.0000 mg | ORAL_CAPSULE | Freq: Every day | ORAL | 3 refills | Status: AC

## 2024-03-15 ENCOUNTER — Other Ambulatory Visit: Payer: Self-pay

## 2024-03-15 ENCOUNTER — Ambulatory Visit: Admission: RE | Admit: 2024-03-15 | Discharge: 2024-03-15 | Disposition: A | Discharge status: Home / Self Care

## 2024-03-15 ENCOUNTER — Encounter: Admission: RE | Disposition: A | Discharge status: Home / Self Care | Payer: Self-pay | Source: Home / Self Care

## 2024-03-15 DIAGNOSIS — Z794 Long term (current) use of insulin: Secondary | ICD-10-CM | POA: Insufficient documentation

## 2024-03-15 DIAGNOSIS — E109 Type 1 diabetes mellitus without complications: Secondary | ICD-10-CM | POA: Diagnosis not present

## 2024-03-15 DIAGNOSIS — Z87891 Personal history of nicotine dependence: Secondary | ICD-10-CM | POA: Insufficient documentation

## 2024-03-15 DIAGNOSIS — I2582 Chronic total occlusion of coronary artery: Secondary | ICD-10-CM | POA: Insufficient documentation

## 2024-03-15 DIAGNOSIS — E782 Mixed hyperlipidemia: Secondary | ICD-10-CM | POA: Diagnosis not present

## 2024-03-15 DIAGNOSIS — E66813 Obesity, class 3: Secondary | ICD-10-CM | POA: Insufficient documentation

## 2024-03-15 DIAGNOSIS — Z951 Presence of aortocoronary bypass graft: Secondary | ICD-10-CM | POA: Diagnosis not present

## 2024-03-15 DIAGNOSIS — I2510 Atherosclerotic heart disease of native coronary artery without angina pectoris: Secondary | ICD-10-CM | POA: Diagnosis not present

## 2024-03-15 DIAGNOSIS — G4733 Obstructive sleep apnea (adult) (pediatric): Secondary | ICD-10-CM | POA: Diagnosis not present

## 2024-03-15 DIAGNOSIS — R601 Generalized edema: Secondary | ICD-10-CM | POA: Diagnosis not present

## 2024-03-15 DIAGNOSIS — R931 Abnormal findings on diagnostic imaging of heart and coronary circulation: Secondary | ICD-10-CM | POA: Insufficient documentation

## 2024-03-15 DIAGNOSIS — Z6841 Body Mass Index (BMI) 40.0 and over, adult: Secondary | ICD-10-CM | POA: Diagnosis not present

## 2024-03-15 DIAGNOSIS — R000 Tachycardia, unspecified: Secondary | ICD-10-CM | POA: Diagnosis not present

## 2024-03-15 HISTORY — PX: LEFT HEART CATH AND CORS/GRAFTS ANGIOGRAPHY: CATH118250

## 2024-03-15 LAB — GLUCOSE, CAPILLARY
Glucose-Capillary: 317 mg/dL — ABNORMAL HIGH (ref 70–99)
Glucose-Capillary: 354 mg/dL — ABNORMAL HIGH (ref 70–99)

## 2024-03-15 SURGERY — LEFT HEART CATH AND CORS/GRAFTS ANGIOGRAPHY
Anesthesia: Moderate Sedation | Laterality: Left

## 2024-03-15 MED ORDER — FREE WATER
500.0000 mL | Freq: Once | Status: AC
  Administered 2024-03-15: 500 mL via ORAL

## 2024-03-15 MED ORDER — ASPIRIN 81 MG PO CHEW
81.0000 mg | CHEWABLE_TABLET | ORAL | Status: AC
  Administered 2024-03-15: 81 mg via ORAL

## 2024-03-15 MED ORDER — SODIUM CHLORIDE 0.9% FLUSH: 3.0000 mL | INTRAVENOUS | Status: DC | PRN

## 2024-03-15 MED ORDER — SODIUM CHLORIDE 0.9% FLUSH: 3.0000 mL | Freq: Two times a day (BID) | INTRAVENOUS | Status: DC

## 2024-03-15 MED ORDER — FENTANYL CITRATE (PF) 100 MCG/2ML IJ SOLN
INTRAMUSCULAR | Status: DC | PRN
  Administered 2024-03-15: 25 ug via INTRAVENOUS
  Administered 2024-03-15: 50 ug via INTRAVENOUS

## 2024-03-15 MED ORDER — SODIUM CHLORIDE 0.9% FLUSH
3.0000 mL | INTRAVENOUS | Status: DC | PRN
Start: 2024-03-15 — End: 2024-03-15

## 2024-03-15 MED ORDER — ONDANSETRON HCL 4 MG/2ML IJ SOLN: 4.0000 mg | Freq: Four times a day (QID) | INTRAMUSCULAR | Status: DC | PRN

## 2024-03-15 MED ORDER — FUROSEMIDE 10 MG/ML IJ SOLN
INTRAMUSCULAR | Status: AC
Start: 2024-03-15 — End: 2024-03-15
  Filled 2024-03-15: qty 8

## 2024-03-15 MED ORDER — IOHEXOL 350 MG/ML SOLN
INTRAVENOUS | Status: DC | PRN
  Administered 2024-03-15: 82 mL

## 2024-03-15 MED ORDER — LIDOCAINE HCL 1 % IJ SOLN
INTRAMUSCULAR | Status: AC
  Filled 2024-03-15: qty 20

## 2024-03-15 MED ORDER — FENTANYL CITRATE (PF) 100 MCG/2ML IJ SOLN
INTRAMUSCULAR | Status: AC
  Filled 2024-03-15: qty 2

## 2024-03-15 MED ORDER — VERAPAMIL HCL 2.5 MG/ML IV SOLN
INTRAVENOUS | Status: AC
  Filled 2024-03-15: qty 2

## 2024-03-15 MED ORDER — FUROSEMIDE 10 MG/ML IJ SOLN: 60.0000 mg | Freq: Every day | INTRAMUSCULAR | Status: DC

## 2024-03-15 MED ORDER — ACETAMINOPHEN 325 MG PO TABS: 650.0000 mg | ORAL_TABLET | ORAL | Status: DC | PRN

## 2024-03-15 MED ORDER — HEPARIN (PORCINE) IN NACL 1000-0.9 UT/500ML-% IV SOLN
INTRAVENOUS | Status: AC
  Filled 2024-03-15: qty 1000

## 2024-03-15 MED ORDER — ASPIRIN 81 MG PO CHEW
CHEWABLE_TABLET | ORAL | Status: AC
  Filled 2024-03-15: qty 1

## 2024-03-15 MED ORDER — SODIUM CHLORIDE 0.9 % IV SOLN
250.0000 mL | INTRAVENOUS | Status: DC | PRN
  Administered 2024-03-15: 250 mL via INTRAVENOUS

## 2024-03-15 MED ORDER — FREE WATER: 250.0000 mL | Freq: Once | Status: DC

## 2024-03-15 MED ORDER — INSULIN GLARGINE-YFGN 100 UNIT/ML ~~LOC~~ SOLN
25.0000 [IU] | Freq: Once | SUBCUTANEOUS | Status: AC
  Administered 2024-03-15: 25 [IU] via SUBCUTANEOUS
  Filled 2024-03-15: qty 0.25

## 2024-03-15 MED ORDER — MIDAZOLAM HCL 2 MG/2ML IJ SOLN
INTRAMUSCULAR | Status: AC
  Filled 2024-03-15: qty 2

## 2024-03-15 MED ORDER — HEPARIN (PORCINE) IN NACL 1000-0.9 UT/500ML-% IV SOLN
INTRAVENOUS | Status: AC
Start: 2024-03-15 — End: 2024-03-15
  Filled 2024-03-15: qty 500

## 2024-03-15 MED ORDER — HEPARIN SODIUM (PORCINE) 1000 UNIT/ML IJ SOLN
INTRAMUSCULAR | Status: DC | PRN
  Administered 2024-03-15: 5000 [IU] via INTRAVENOUS

## 2024-03-15 MED ORDER — METOLAZONE 5 MG PO TABS: 5.0000 mg | ORAL_TABLET | Freq: Every day | ORAL | 0 refills | Status: DC

## 2024-03-15 MED ORDER — SODIUM CHLORIDE 0.9 % IV SOLN: 250.0000 mL | INTRAVENOUS | Status: DC | PRN

## 2024-03-15 MED ORDER — HEPARIN SODIUM (PORCINE) 1000 UNIT/ML IJ SOLN
INTRAMUSCULAR | Status: AC
  Filled 2024-03-15: qty 10

## 2024-03-15 MED ORDER — HEPARIN (PORCINE) IN NACL 1000-0.9 UT/500ML-% IV SOLN
INTRAVENOUS | Status: DC | PRN
  Administered 2024-03-15 (×2): 500 mL

## 2024-03-15 MED ORDER — VERAPAMIL HCL 2.5 MG/ML IV SOLN
INTRAVENOUS | Status: DC | PRN
  Administered 2024-03-15: 2.5 mg via INTRA_ARTERIAL

## 2024-03-15 MED ORDER — LIDOCAINE HCL (PF) 1 % IJ SOLN
INTRAMUSCULAR | Status: DC | PRN
  Administered 2024-03-15: 2 mL

## 2024-03-15 MED ORDER — MIDAZOLAM HCL 2 MG/2ML IJ SOLN
INTRAMUSCULAR | Status: DC | PRN
  Administered 2024-03-15 (×2): 1 mg via INTRAVENOUS

## 2024-03-15 MED ORDER — HYDRALAZINE HCL 20 MG/ML IJ SOLN: 10.0000 mg | INTRAMUSCULAR | Status: DC | PRN

## 2024-03-15 MED ORDER — FUROSEMIDE 10 MG/ML IJ SOLN
80.0000 mg | Freq: Once | INTRAMUSCULAR | Status: AC
  Administered 2024-03-15: 80 mg via INTRAVENOUS

## 2024-03-15 SURGICAL SUPPLY — 10 items
CATH INFINITI 5 FR IM (CATHETERS) IMPLANT
CATH INFINITI 5 FR JL3.5 (CATHETERS) IMPLANT
CATH INFINITI JR4 5F (CATHETERS) IMPLANT
DEVICE RAD TR BAND REGULAR (VASCULAR PRODUCTS) IMPLANT
DRAPE BRACHIAL (DRAPES) IMPLANT
GLIDESHEATH SLEND A-KIT 6F 22G (SHEATH) IMPLANT
GUIDEWIRE INQWIRE 1.5J.035X260 (WIRE) IMPLANT
PACK CARDIAC CATH (CUSTOM PROCEDURE TRAY) ×1 IMPLANT
SET ATX-X65L (MISCELLANEOUS) IMPLANT
STATION PROTECTION PRESSURIZED (MISCELLANEOUS) IMPLANT

## 2024-03-15 NOTE — Progress Notes (Signed)
 Dr. Murray Khan at bedside, speaking with pt. And his girlfriend re: cath results. Both verbalized understanding of conversation with MD.

## 2024-03-15 NOTE — Inpatient Diabetes Management (Signed)
 Inpatient Diabetes Program Recommendations  AACE/ADA: New Consensus Statement on Inpatient Glycemic Control  Target Ranges:  Prepandial:   less than 140 mg/dL      Peak postprandial:   less than 180 mg/dL (1-2 hours)      Critically ill patients:  140 - 180 mg/dL    Latest Reference Range & Units 03/15/24 08:15  Glucose-Capillary 70 - 99 mg/dL 682 (H)    94/97/74 99:99  Hemoglobin-A1c 9.4% (E)  (E): External lab result   Review of Glycemic Control  Diabetes history: DM1 Outpatient Diabetes medications: Lantus  50 untis at bedtime, Humalog  for correction and carb coverage Current orders for Inpatient glycemic control: Semglee  25 units x1 now  Inpatient Diabetes Program Recommendations:    Insulin : If patient is admitted following heart cath today, recommend to order Semglee  25 units daily (to start on 8/7 since already ordered 25 units today), CBGs AC&HS, Novolog  0-9 units AC&HS, and Novolog  3 units TID with meals for meal coverage if patient eats at least 50% of meals.  NOTE: Communicated with Dorene Comfort, PA regarding patient in cath lab with hx of Type 1 DM. Patient was suppose to take Lantus  25 units last night but patient was nervous it was going to drop his BG and not able to eat due to this procedure.  Therefore has order for Semglee  25 units x1 now given CBG is 317 mg/dl at 1:84 today. Patient sees Dr. Damian and was last seen on 12/16/23, A1C was 9.4% on 12/10/23 and patient was noted to be taking Lantus  50 units at bedtime, Humalog  5-30 units TID with meals.   Made recommendation in case patient is admitted following procedure.   Thanks, Earnie Gainer, RN, MSN, CDCES Diabetes Coordinator Inpatient Diabetes Program [phone] (Team Pager from 8am to 5pm)

## 2024-03-15 NOTE — Discharge Instructions (Signed)
 Radial Site Care Refer to this sheet in the next few weeks. These instructions provide you with information about caring for yourself after your procedure. Your health care provider may also give you more specific instructions. Your treatment has been planned according to current medical practices, but problems sometimes occur. Call your health care provider if you have any problems or questions after your procedure. What can I expect after the procedure? After your procedure, it is typical to have the following: Bruising at the radial site that usually fades within 1-2 weeks. Blood collecting in the tissue (hematoma) that may be painful to the touch. It should usually decrease in size and tenderness within 1-2 weeks.  Follow these instructions at home: Take medicines only as directed by your health care provider. If you are on a medication called Metformin please do not take for 48 hours after your procedure. Over the next 48hrs please increase your fluid intake of water and non caffeine beverages to flush the contrast dye out of your system.  You may shower 24 hours after the procedure  Leave your bandage on and gently wash the site with plain soap and water. Pat the area dry with a clean towel. Do not rub the site, because this may cause bleeding.  Remove your dressing 48hrs after your procedure and leave open to air.  Do not submerge your site in water for 7 days. This includes swimming and washing dishes.  Check your insertion site every day for redness, swelling, or drainage. Do not apply powder or lotion to the site. Do not flex or bend the affected arm for 24 hours or as directed by your health care provider. Do not push or pull heavy objects with the affected arm for 24 hours or as directed by your health care provider. Do not lift over 10 lb (4.5 kg) for 5 days after your procedure or as directed by your health care provider. Ask your health care provider when it is okay to: Return to  work or school. Resume usual physical activities or sports. Resume sexual activity. Do not drive home if you are discharged the same day as the procedure. Have someone else drive you. You may drive 48 hours after the procedure Do not operate machinery or power tools for 24 hours after the procedure. If your procedure was done as an outpatient procedure, which means that you went home the same day as your procedure, a responsible adult should be with you for the first 24 hours after you arrive home. Keep all follow-up visits as directed by your health care provider. This is important. Contact a health care provider if: You have a fever. You have chills. You have increased bleeding from the radial site. Hold pressure on the site. Get help right away if: You have unusual pain at the radial site. You have redness, warmth, or swelling at the radial site. You have drainage (other than a small amount of blood on the dressing) from the radial site. The radial site is bleeding, and the bleeding does not stop after 15 minutes of holding steady pressure on the site. Your arm or hand becomes pale, cool, tingly, or numb. This information is not intended to replace advice given to you by your health care provider. Make sure you discuss any questions you have with your health care provider. Document Released: 08/29/2010 Document Revised: 01/02/2016 Document Reviewed: 02/12/2014 Elsevier Interactive Patient Education  2018 [employer].

## 2024-03-28 DIAGNOSIS — Z951 Presence of aortocoronary bypass graft: Secondary | ICD-10-CM | POA: Diagnosis not present

## 2024-03-28 DIAGNOSIS — E109 Type 1 diabetes mellitus without complications: Secondary | ICD-10-CM | POA: Diagnosis not present

## 2024-03-28 DIAGNOSIS — Z87891 Personal history of nicotine dependence: Secondary | ICD-10-CM | POA: Diagnosis not present

## 2024-03-28 DIAGNOSIS — I2582 Chronic total occlusion of coronary artery: Secondary | ICD-10-CM | POA: Diagnosis not present

## 2024-03-28 DIAGNOSIS — R9439 Abnormal result of other cardiovascular function study: Secondary | ICD-10-CM | POA: Diagnosis not present

## 2024-03-28 DIAGNOSIS — R9431 Abnormal electrocardiogram [ECG] [EKG]: Secondary | ICD-10-CM | POA: Diagnosis not present

## 2024-03-28 DIAGNOSIS — I10 Essential (primary) hypertension: Secondary | ICD-10-CM | POA: Diagnosis not present

## 2024-03-28 DIAGNOSIS — I739 Peripheral vascular disease, unspecified: Secondary | ICD-10-CM | POA: Diagnosis not present

## 2024-03-28 DIAGNOSIS — E785 Hyperlipidemia, unspecified: Secondary | ICD-10-CM | POA: Diagnosis not present

## 2024-03-28 DIAGNOSIS — I4510 Unspecified right bundle-branch block: Secondary | ICD-10-CM | POA: Diagnosis not present

## 2024-03-28 DIAGNOSIS — Z794 Long term (current) use of insulin: Secondary | ICD-10-CM | POA: Diagnosis not present

## 2024-03-28 DIAGNOSIS — I25702 Atherosclerosis of coronary artery bypass graft(s), unspecified, with refractory angina pectoris: Secondary | ICD-10-CM | POA: Diagnosis not present

## 2024-03-28 DIAGNOSIS — I25118 Atherosclerotic heart disease of native coronary artery with other forms of angina pectoris: Secondary | ICD-10-CM | POA: Diagnosis not present

## 2024-04-08 ENCOUNTER — Other Ambulatory Visit: Payer: Self-pay | Admitting: Nurse Practitioner

## 2024-04-08 DIAGNOSIS — E1059 Type 1 diabetes mellitus with other circulatory complications: Secondary | ICD-10-CM

## 2024-04-10 ENCOUNTER — Other Ambulatory Visit: Payer: Self-pay | Admitting: Nurse Practitioner

## 2024-04-10 DIAGNOSIS — F325 Major depressive disorder, single episode, in full remission: Secondary | ICD-10-CM

## 2024-04-11 NOTE — Telephone Encounter (Signed)
 Expired Rx- Appt 06/28/24 Requested Prescriptions  Pending Prescriptions Disp Refills   torsemide  (DEMADEX ) 20 MG tablet [Pharmacy Med Name: TORSEMIDE  20 MG TAB[*]] 360 tablet 4    Sig: TAKE TWO TABLETS BY MOUTH TWICE A DAY     Cardiovascular:  Diuretics - Loop Failed - 04/11/2024 11:10 AM      Failed - Ca in normal range and within 180 days    Calcium   Date Value Ref Range Status  12/27/2023 8.6 (L) 8.7 - 10.2 mg/dL Final         Failed - Cr in normal range and within 180 days    Creatinine, Ser  Date Value Ref Range Status  02/21/2024 1.70 (H) 0.61 - 1.24 mg/dL Final         Failed - Last BP in normal range    BP Readings from Last 1 Encounters:  03/15/24 (!) 142/70         Passed - K in normal range and within 180 days    Potassium  Date Value Ref Range Status  12/27/2023 4.4 3.5 - 5.2 mmol/L Final         Passed - Na in normal range and within 180 days    Sodium  Date Value Ref Range Status  12/27/2023 137 134 - 144 mmol/L Final         Passed - Cl in normal range and within 180 days    Chloride  Date Value Ref Range Status  12/27/2023 98 96 - 106 mmol/L Final         Passed - Mg Level in normal range and within 180 days    Magnesium   Date Value Ref Range Status  12/27/2023 2.1 1.6 - 2.3 mg/dL Final         Passed - Valid encounter within last 6 months    Recent Outpatient Visits           3 months ago Type 1 diabetes mellitus with peripheral vascular disease (HCC)   [facility] [place], Melanie DASEN, NP

## 2024-04-11 NOTE — Telephone Encounter (Signed)
 Requested Prescriptions  Refused Prescriptions Disp Refills   amitriptyline  (ELAVIL ) 100 MG tablet [Pharmacy Med Name: AMITRIPTYLINE  100 MG TAB] 90 tablet 0    Sig: TAKE ONE TABLET BY MOUTH AT BEDTIME     Psychiatry:  Antidepressants - Heterocyclics (TCAs) Passed - 04/11/2024  3:09 PM      Passed - Completed PHQ-2 or PHQ-9 in the last 360 days      Passed - Valid encounter within last 6 months    Recent Outpatient Visits           3 months ago Type 1 diabetes mellitus with peripheral vascular disease (HCC)   [facility] [place], Melanie DASEN, NP

## 2024-04-12 DIAGNOSIS — E1065 Type 1 diabetes mellitus with hyperglycemia: Secondary | ICD-10-CM | POA: Diagnosis not present

## 2024-04-13 DIAGNOSIS — I2510 Atherosclerotic heart disease of native coronary artery without angina pectoris: Secondary | ICD-10-CM | POA: Diagnosis not present

## 2024-04-13 DIAGNOSIS — E785 Hyperlipidemia, unspecified: Secondary | ICD-10-CM | POA: Diagnosis not present

## 2024-04-13 DIAGNOSIS — I2583 Coronary atherosclerosis due to lipid rich plaque: Secondary | ICD-10-CM | POA: Diagnosis not present

## 2024-04-13 DIAGNOSIS — I10 Essential (primary) hypertension: Secondary | ICD-10-CM | POA: Diagnosis not present

## 2024-04-13 DIAGNOSIS — I5032 Chronic diastolic (congestive) heart failure: Secondary | ICD-10-CM | POA: Diagnosis not present

## 2024-04-13 DIAGNOSIS — E109 Type 1 diabetes mellitus without complications: Secondary | ICD-10-CM | POA: Diagnosis not present

## 2024-04-13 DIAGNOSIS — Z951 Presence of aortocoronary bypass graft: Secondary | ICD-10-CM | POA: Diagnosis not present

## 2024-04-18 DIAGNOSIS — E1065 Type 1 diabetes mellitus with hyperglycemia: Secondary | ICD-10-CM | POA: Diagnosis not present

## 2024-04-18 DIAGNOSIS — E103553 Type 1 diabetes mellitus with stable proliferative diabetic retinopathy, bilateral: Secondary | ICD-10-CM | POA: Diagnosis not present

## 2024-04-18 DIAGNOSIS — R809 Proteinuria, unspecified: Secondary | ICD-10-CM | POA: Diagnosis not present

## 2024-04-18 DIAGNOSIS — E1059 Type 1 diabetes mellitus with other circulatory complications: Secondary | ICD-10-CM | POA: Diagnosis not present

## 2024-04-18 DIAGNOSIS — E1029 Type 1 diabetes mellitus with other diabetic kidney complication: Secondary | ICD-10-CM | POA: Diagnosis not present

## 2024-04-18 DIAGNOSIS — I10 Essential (primary) hypertension: Secondary | ICD-10-CM | POA: Diagnosis not present

## 2024-04-18 DIAGNOSIS — N1831 Chronic kidney disease, stage 3a: Secondary | ICD-10-CM | POA: Diagnosis not present

## 2024-04-18 DIAGNOSIS — E1022 Type 1 diabetes mellitus with diabetic chronic kidney disease: Secondary | ICD-10-CM | POA: Diagnosis not present

## 2024-04-18 DIAGNOSIS — E1042 Type 1 diabetes mellitus with diabetic polyneuropathy: Secondary | ICD-10-CM | POA: Diagnosis not present

## 2024-04-27 DIAGNOSIS — I5032 Chronic diastolic (congestive) heart failure: Secondary | ICD-10-CM | POA: Diagnosis not present

## 2024-05-03 NOTE — Progress Notes (Signed)
 Mike Kline                                          MRN: [id number]   05/03/2024   The [facility] Quality Team Specialist reviewed this patient medical record for the purposes of chart review for care gap closure. The following were reviewed: chart review for care gap closure-glycemic status assessment.    [facility] Quality Team

## 2024-05-04 NOTE — Progress Notes (Signed)
 PARAG DORTON                                          MRN: [id number]   05/04/2024   The [facility] Quality Team Specialist reviewed this patient medical record for the purposes of chart review for care gap closure. The following were reviewed: chart review for care gap closure-colorectal cancer screening.    [facility] Quality Team

## 2024-05-12 DIAGNOSIS — N179 Acute kidney failure, unspecified: Secondary | ICD-10-CM | POA: Diagnosis not present

## 2024-05-12 DIAGNOSIS — E7800 Pure hypercholesterolemia, unspecified: Secondary | ICD-10-CM | POA: Diagnosis not present

## 2024-05-12 DIAGNOSIS — E1022 Type 1 diabetes mellitus with diabetic chronic kidney disease: Secondary | ICD-10-CM | POA: Diagnosis not present

## 2024-05-12 DIAGNOSIS — E119 Type 2 diabetes mellitus without complications: Secondary | ICD-10-CM | POA: Diagnosis not present

## 2024-05-12 DIAGNOSIS — I255 Ischemic cardiomyopathy: Secondary | ICD-10-CM | POA: Diagnosis not present

## 2024-05-12 DIAGNOSIS — E1042 Type 1 diabetes mellitus with diabetic polyneuropathy: Secondary | ICD-10-CM | POA: Diagnosis not present

## 2024-05-12 DIAGNOSIS — I10 Essential (primary) hypertension: Secondary | ICD-10-CM | POA: Diagnosis not present

## 2024-05-12 DIAGNOSIS — Z538 Procedure and treatment not carried out for other reasons: Secondary | ICD-10-CM | POA: Diagnosis not present

## 2024-05-12 DIAGNOSIS — Z7902 Long term (current) use of antithrombotics/antiplatelets: Secondary | ICD-10-CM | POA: Diagnosis not present

## 2024-05-12 DIAGNOSIS — I2582 Chronic total occlusion of coronary artery: Secondary | ICD-10-CM | POA: Diagnosis not present

## 2024-05-12 DIAGNOSIS — R188 Other ascites: Secondary | ICD-10-CM | POA: Diagnosis not present

## 2024-05-12 DIAGNOSIS — Z79899 Other long term (current) drug therapy: Secondary | ICD-10-CM | POA: Diagnosis not present

## 2024-05-12 DIAGNOSIS — J9811 Atelectasis: Secondary | ICD-10-CM | POA: Diagnosis not present

## 2024-05-12 DIAGNOSIS — N183 Chronic kidney disease, stage 3 unspecified: Secondary | ICD-10-CM | POA: Diagnosis not present

## 2024-05-12 DIAGNOSIS — Z794 Long term (current) use of insulin: Secondary | ICD-10-CM | POA: Diagnosis not present

## 2024-05-12 DIAGNOSIS — Z951 Presence of aortocoronary bypass graft: Secondary | ICD-10-CM | POA: Diagnosis not present

## 2024-05-12 DIAGNOSIS — I2510 Atherosclerotic heart disease of native coronary artery without angina pectoris: Secondary | ICD-10-CM | POA: Diagnosis not present

## 2024-05-12 DIAGNOSIS — D509 Iron deficiency anemia, unspecified: Secondary | ICD-10-CM | POA: Diagnosis not present

## 2024-05-12 DIAGNOSIS — I5030 Unspecified diastolic (congestive) heart failure: Secondary | ICD-10-CM | POA: Diagnosis not present

## 2024-05-12 DIAGNOSIS — I130 Hypertensive heart and chronic kidney disease with heart failure and stage 1 through stage 4 chronic kidney disease, or unspecified chronic kidney disease: Secondary | ICD-10-CM | POA: Diagnosis not present

## 2024-05-12 DIAGNOSIS — N182 Chronic kidney disease, stage 2 (mild): Secondary | ICD-10-CM | POA: Diagnosis not present

## 2024-05-12 DIAGNOSIS — K219 Gastro-esophageal reflux disease without esophagitis: Secondary | ICD-10-CM | POA: Diagnosis not present

## 2024-05-12 DIAGNOSIS — I2583 Coronary atherosclerosis due to lipid rich plaque: Secondary | ICD-10-CM | POA: Diagnosis not present

## 2024-05-12 DIAGNOSIS — E1069 Type 1 diabetes mellitus with other specified complication: Secondary | ICD-10-CM | POA: Diagnosis not present

## 2024-05-12 DIAGNOSIS — I5033 Acute on chronic diastolic (congestive) heart failure: Secondary | ICD-10-CM | POA: Diagnosis not present

## 2024-05-12 DIAGNOSIS — Z7982 Long term (current) use of aspirin: Secondary | ICD-10-CM | POA: Diagnosis not present

## 2024-05-12 DIAGNOSIS — E785 Hyperlipidemia, unspecified: Secondary | ICD-10-CM | POA: Diagnosis not present

## 2024-05-12 DIAGNOSIS — I2542 Coronary artery dissection: Secondary | ICD-10-CM | POA: Diagnosis not present

## 2024-05-12 DIAGNOSIS — I5032 Chronic diastolic (congestive) heart failure: Secondary | ICD-10-CM | POA: Diagnosis not present

## 2024-05-12 DIAGNOSIS — E8770 Fluid overload, unspecified: Secondary | ICD-10-CM | POA: Diagnosis not present

## 2024-05-12 DIAGNOSIS — E1065 Type 1 diabetes mellitus with hyperglycemia: Secondary | ICD-10-CM | POA: Diagnosis not present

## 2024-05-14 DIAGNOSIS — I5030 Unspecified diastolic (congestive) heart failure: Secondary | ICD-10-CM | POA: Diagnosis not present

## 2024-05-23 DIAGNOSIS — E1059 Type 1 diabetes mellitus with other circulatory complications: Secondary | ICD-10-CM | POA: Diagnosis not present

## 2024-05-23 DIAGNOSIS — E1042 Type 1 diabetes mellitus with diabetic polyneuropathy: Secondary | ICD-10-CM | POA: Diagnosis not present

## 2024-05-23 DIAGNOSIS — E103553 Type 1 diabetes mellitus with stable proliferative diabetic retinopathy, bilateral: Secondary | ICD-10-CM | POA: Diagnosis not present

## 2024-05-23 DIAGNOSIS — N1831 Chronic kidney disease, stage 3a: Secondary | ICD-10-CM | POA: Diagnosis not present

## 2024-05-23 DIAGNOSIS — E1065 Type 1 diabetes mellitus with hyperglycemia: Secondary | ICD-10-CM | POA: Diagnosis not present

## 2024-05-23 DIAGNOSIS — E1029 Type 1 diabetes mellitus with other diabetic kidney complication: Secondary | ICD-10-CM | POA: Diagnosis not present

## 2024-05-23 DIAGNOSIS — R809 Proteinuria, unspecified: Secondary | ICD-10-CM | POA: Diagnosis not present

## 2024-05-23 DIAGNOSIS — E109 Type 1 diabetes mellitus without complications: Secondary | ICD-10-CM | POA: Diagnosis not present

## 2024-05-23 DIAGNOSIS — E1022 Type 1 diabetes mellitus with diabetic chronic kidney disease: Secondary | ICD-10-CM | POA: Diagnosis not present

## 2024-05-23 DIAGNOSIS — I10 Essential (primary) hypertension: Secondary | ICD-10-CM | POA: Diagnosis not present

## 2024-05-23 DIAGNOSIS — Z9289 Personal history of other medical treatment: Secondary | ICD-10-CM | POA: Diagnosis not present

## 2024-05-26 DIAGNOSIS — I2583 Coronary atherosclerosis due to lipid rich plaque: Secondary | ICD-10-CM | POA: Diagnosis not present

## 2024-05-26 DIAGNOSIS — I5030 Unspecified diastolic (congestive) heart failure: Secondary | ICD-10-CM | POA: Diagnosis not present

## 2024-05-26 DIAGNOSIS — R600 Localized edema: Secondary | ICD-10-CM | POA: Diagnosis not present

## 2024-05-26 DIAGNOSIS — I10 Essential (primary) hypertension: Secondary | ICD-10-CM | POA: Diagnosis not present

## 2024-05-26 DIAGNOSIS — G4733 Obstructive sleep apnea (adult) (pediatric): Secondary | ICD-10-CM | POA: Diagnosis not present

## 2024-05-26 DIAGNOSIS — E785 Hyperlipidemia, unspecified: Secondary | ICD-10-CM | POA: Diagnosis not present

## 2024-05-26 DIAGNOSIS — E109 Type 1 diabetes mellitus without complications: Secondary | ICD-10-CM | POA: Diagnosis not present

## 2024-05-26 DIAGNOSIS — I2510 Atherosclerotic heart disease of native coronary artery without angina pectoris: Secondary | ICD-10-CM | POA: Diagnosis not present

## 2024-05-26 DIAGNOSIS — Z951 Presence of aortocoronary bypass graft: Secondary | ICD-10-CM | POA: Diagnosis not present

## 2024-05-26 DIAGNOSIS — E1042 Type 1 diabetes mellitus with diabetic polyneuropathy: Secondary | ICD-10-CM | POA: Diagnosis not present

## 2024-05-26 DIAGNOSIS — I5032 Chronic diastolic (congestive) heart failure: Secondary | ICD-10-CM | POA: Diagnosis not present

## 2024-06-12 ENCOUNTER — Other Ambulatory Visit: Payer: Self-pay | Admitting: Nurse Practitioner

## 2024-06-12 DIAGNOSIS — E1059 Type 1 diabetes mellitus with other circulatory complications: Secondary | ICD-10-CM

## 2024-06-14 NOTE — Telephone Encounter (Signed)
 Requested Prescriptions  Pending Prescriptions Disp Refills   losartan  (COZAAR ) 50 MG tablet [Pharmacy Med Name: LOSARTAN  50 MG TAB[*]] 90 tablet 0    Sig: Take 1 tablet (50 mg total) by mouth every evening.     Cardiovascular:  Angiotensin Receptor Blockers Failed - 06/14/2024 10:48 AM      Failed - Cr in normal range and within 180 days    Creatinine, Ser  Date Value Ref Range Status  02/21/2024 1.70 (H) 0.61 - 1.24 mg/dL Final         Failed - Last BP in normal range    BP Readings from Last 1 Encounters:  03/15/24 (!) 142/70         Passed - K in normal range and within 180 days    Potassium  Date Value Ref Range Status  12/27/2023 4.4 3.5 - 5.2 mmol/L Final         Passed - Patient is not pregnant      Passed - Valid encounter within last 6 months    Recent Outpatient Visits           5 months ago Type 1 diabetes mellitus with peripheral vascular disease (HCC)   [facility] [place], Melanie DASEN, NP

## 2024-06-25 LAB — HEMOGLOBIN A1C: Hemoglobin-A1c: 9.4

## 2024-06-25 NOTE — Patient Instructions (Signed)

## 2024-06-28 ENCOUNTER — Encounter: Payer: Self-pay | Admitting: Nurse Practitioner

## 2024-06-28 ENCOUNTER — Ambulatory Visit: Admitting: Nurse Practitioner

## 2024-06-28 VITALS — BP 142/76 | HR 101 | Temp 98.6°F | Resp 18 | Ht 67.01 in | Wt 269.0 lb

## 2024-06-28 DIAGNOSIS — J4540 Moderate persistent asthma, uncomplicated: Secondary | ICD-10-CM

## 2024-06-28 DIAGNOSIS — E103553 Type 1 diabetes mellitus with stable proliferative diabetic retinopathy, bilateral: Secondary | ICD-10-CM | POA: Diagnosis not present

## 2024-06-28 DIAGNOSIS — E1059 Type 1 diabetes mellitus with other circulatory complications: Secondary | ICD-10-CM

## 2024-06-28 DIAGNOSIS — I152 Hypertension secondary to endocrine disorders: Secondary | ICD-10-CM

## 2024-06-28 DIAGNOSIS — E1022 Type 1 diabetes mellitus with diabetic chronic kidney disease: Secondary | ICD-10-CM

## 2024-06-28 DIAGNOSIS — E1051 Type 1 diabetes mellitus with diabetic peripheral angiopathy without gangrene: Secondary | ICD-10-CM | POA: Diagnosis not present

## 2024-06-28 DIAGNOSIS — F324 Major depressive disorder, single episode, in partial remission: Secondary | ICD-10-CM

## 2024-06-28 DIAGNOSIS — I5032 Chronic diastolic (congestive) heart failure: Secondary | ICD-10-CM

## 2024-06-28 DIAGNOSIS — F419 Anxiety disorder, unspecified: Secondary | ICD-10-CM

## 2024-06-28 DIAGNOSIS — E1042 Type 1 diabetes mellitus with diabetic polyneuropathy: Secondary | ICD-10-CM

## 2024-06-28 DIAGNOSIS — G4709 Other insomnia: Secondary | ICD-10-CM

## 2024-06-28 DIAGNOSIS — E1069 Type 1 diabetes mellitus with other specified complication: Secondary | ICD-10-CM

## 2024-06-28 DIAGNOSIS — N1831 Chronic kidney disease, stage 3a: Secondary | ICD-10-CM

## 2024-06-28 DIAGNOSIS — R0683 Snoring: Secondary | ICD-10-CM

## 2024-06-28 MED ORDER — TRIAMCINOLONE ACETONIDE 0.1 % EX CREA: 1.0000 | TOPICAL_CREAM | Freq: Two times a day (BID) | CUTANEOUS | 3 refills | Status: AC

## 2024-06-28 MED ORDER — LOSARTAN POTASSIUM 50 MG PO TABS: 50.0000 mg | ORAL_TABLET | Freq: Every evening | ORAL | 4 refills | Status: AC

## 2024-06-28 NOTE — Assessment & Plan Note (Signed)
 BMI 42.12 with T1DM and CAD.  Recommended eating smaller high protein, low fat meals more frequently and exercising 30 mins a day 5 times a week with a goal of 10-15lb weight loss in the next 3 months. Patient voiced their understanding and motivation to adhere to these recommendations.

## 2024-06-28 NOTE — Assessment & Plan Note (Signed)
 Chronic, ongoing.  Up To Date on eye exam.  A1c recent with endo 9.4% with endo and urine ALB 18 Dec 2023.  Continue collaboration with endo, recent notes reviewed.

## 2024-06-28 NOTE — Progress Notes (Signed)
 BP (!) 142/76 (BP Location: Left Arm, Patient Position: Sitting)   Pulse (!) 101   Temp 98.6 F (37 C) (Oral)   Resp 18   Ht 5' 7.01 (1.702 m)   Wt 269 lb (122 kg)   SpO2 97%   BMI 42.12 kg/m    Subjective:    Patient ID: Mike Kline, male    DOB: September 07, 1970, 53 y.o.   MRN: [id number]  HPI: Mike Kline is a 53 y.o. male  Chief Complaint  Patient presents with   Diabetes    Home checks and he feels he is doing better but endo wants him on the pump.    Hypertension    Doesn't check it at home often. Doesn't feel good even if sugars are good and he wonders why.    DIABETES (TYPE 1) Saw endocrinology last on 05/23/24, A1c 9.4% and he reported he would try insulin  pump.  Has not heard from person who is supposed to call him for pump. They discussed option of GLP1 for weight loss, but at time refused to go for sleep study. Is starting to get over some of his fear of low levels. Freestyle -- average glucose over 90 days is 197. Time in Target 90 days: Above 58%, In Target 40%, Below 2%.  Has had 43 lows over past 90 days. 53 years old when diagnosed with diabetes.  Vascular last 12/17/22, deep venous insufficiency.  Does not wear compression hose at home, difficulty getting on. Hypoglycemic episodes: as above Polydipsia/polyuria: no Visual disturbance: no Chest pain: no Paresthesias: no Glucose Monitoring: yes  Accucheck frequency: TID  Fasting glucose:  Post prandial:  Evening:  Before meals: Taking Insulin ?: yes  Long acting insulin : Lantus  50 units  Short acting insulin : Humalog  sliding scale Blood Pressure Monitoring: not checking Retinal Examination: Up to Date -- Dr. Mevelyn New Exam: Up to Date Pneumovax: Up to Date Influenza: Not Up to Date Aspirin : no   HYPERTENSION / HYPERLIPIDEMIA Saw cardiology last 05/26/24, recommend sleep study. Was hospitalized at [facility] on 05/12/24 for HF exacerbation. Satisfied with current treatment? yes Duration of hypertension:  chronic BP monitoring frequency: not checking BP range:  BP medication side effects: no Duration of hyperlipidemia: chronic Cholesterol medication side effects: no Cholesterol supplements: none Medication compliance: good compliance Aspirin : no Recent stressors: no Recurrent headaches: no Visual changes: no Palpitations: no Dyspnea: no Chest pain: no Lower extremity edema: no Dizzy/lightheaded: no   ASTHMA Uses Albuterol  as needed. Asthma status: stable Satisfied with current treatment?: yes Albuterol /rescue inhaler frequency: none Dyspnea frequency: no Wheezing frequency: no Cough frequency: no Nocturnal symptom frequency: no Limitation of activity: no Current upper respiratory symptoms: no Triggers: pollen Home peak flows:none Last Spirometry: not recent  Failed/intolerant to following asthma meds: none Asthma meds in past:  Aerochamber/spacer use: no Visits to ER or Urgent Care in past year: no Pneumovax: refuses Influenza: Up to Date   BACK PAIN Chronic issue -- ongoing.  Imaging did note degenerative changes on 06/28/23.  Continues Amitriptyline  for mood and sleep.   Duration: days Mechanism of injury: unknown Location: bilateral and low back Onset: gradual Severity: 10/10 at worst Quality: dull, aching, and throbbing Frequency: intermittent Radiation: none Aggravating factors: lifting, movement, and bending Alleviating factors: rest Status: fluctuating Treatments attempted: rest and APAP, BC Powder Relief with NSAIDs?: No NSAIDs Taken Nighttime pain:  no Paresthesias / decreased sensation:  no Bowel / bladder incontinence:  no Fevers:  no Dysuria / urinary frequency:  no   Relevant past medical, surgical, family and social history reviewed and updated as indicated. Interim medical history since our last visit reviewed. Allergies and medications reviewed and updated.  Review of Systems  Constitutional:  Positive for fatigue. Negative for activity  change, appetite change, diaphoresis and fever.  Respiratory:  Positive for shortness of breath (with long distance). Negative for cough, chest tightness and wheezing.   Cardiovascular:  Positive for palpitations and leg swelling. Negative for chest pain.  Gastrointestinal: Negative.   Endocrine: Negative for polydipsia, polyphagia and polyuria.  Musculoskeletal:  Positive for back pain.  Neurological: Negative.   Psychiatric/Behavioral: Negative.     Per HPI unless specifically indicated above     Objective:    BP (!) 142/76 (BP Location: Left Arm, Patient Position: Sitting)   Pulse (!) 101   Temp 98.6 F (37 C) (Oral)   Resp 18   Ht 5' 7.01 (1.702 m)   Wt 269 lb (122 kg)   SpO2 97%   BMI 42.12 kg/m   Wt Readings from Last 3 Encounters:  06/28/24 269 lb (122 kg)  03/15/24 263 lb 1.6 oz (119.3 kg)  01/25/24 250 lb (113.4 kg)    Physical Exam Vitals and nursing note reviewed.  Constitutional:      General: He is awake. He is not in acute distress.    Appearance: He is well-developed and well-groomed. He is obese. He is not ill-appearing or toxic-appearing.  HENT:     Head: Normocephalic.     Right Ear: Hearing and external ear normal.     Left Ear: Hearing and external ear normal.  Eyes:     General: Lids are normal.     Extraocular Movements: Extraocular movements intact.     Conjunctiva/sclera: Conjunctivae normal.  Neck:     Thyroid : No thyromegaly.     Vascular: No carotid bruit.  Cardiovascular:     Rate and Rhythm: Normal rate and regular rhythm.     Heart sounds: Normal heart sounds. No murmur heard.    No gallop.     Comments: Rubor to both lower extremities and very dry skin. Pulmonary:     Effort: No accessory muscle usage or respiratory distress.     Breath sounds: Normal breath sounds. No decreased breath sounds, wheezing or rales.  Abdominal:     General: Bowel sounds are normal. There is no distension.     Palpations: Abdomen is soft.      Tenderness: There is no abdominal tenderness. There is no right CVA tenderness or left CVA tenderness.  Musculoskeletal:     Cervical back: Full passive range of motion without pain.     Right lower leg: 1+ Pitting Edema present.     Left lower leg: 1+ Pitting Edema present.  Lymphadenopathy:     Cervical: No cervical adenopathy.  Skin:    General: Skin is warm.     Capillary Refill: Capillary refill takes less than 2 seconds.  Neurological:     Mental Status: He is alert and oriented to person, place, and time.     Deep Tendon Reflexes: Reflexes are normal and symmetric.     Reflex Scores:      Brachioradialis reflexes are 2+ on the right side and 2+ on the left side.      Patellar reflexes are 2+ on the right side and 2+ on the left side. Psychiatric:        Attention and Perception: Attention normal.  Mood and Affect: Mood normal.        Speech: Speech normal.        Behavior: Behavior normal. Behavior is cooperative.        Thought Content: Thought content normal.    Diabetic Foot Exam - Simple   Simple Foot Form Visual Inspection See comments: Yes Sensation Testing See comments: Yes Pulse Check See comments: Yes Comments 1+ PT and DP bilateral. Very dry skin BLE and thickened toenails. 6/10 sensation BLE.     Results for orders placed or performed in visit on 06/28/24  Hemoglobin A1C w/out eAG   Collection Time: 04/12/24 12:00 AM  Result Value Ref Range   Hemoglobin-A1c 9.4%       Assessment & Plan:   Problem List Items Addressed This Visit       Cardiovascular and Mediastinum   Type 1 diabetes mellitus with peripheral vascular disease (HCC)   Chronic, ongoing.  A1c recent with endo 9.4% and urine ALB 18 Dec 2023.  Continue collaboration with endo, recent notes reviewed. Continue Amitriptyline  and Gabapentin  as ordered. Discussed with him importance of diabetes control to prevent worsening symptoms or wounds which could lead to loss of limb.  Have highly  recommended he look into insulin  pump placement as endo has recommended and discussed at length the risks of poor diabetes control.  He is going to call and schedule follow-up with vascular, highly recommended this. 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 - ARB and Statin on board - Needs eye exam.  Foot exam up to date - Vaccinations - refuses flu and pneumococcal vaccines      Relevant Medications   metoprolol  succinate (TOPROL -XL) 100 MG 24 hr tablet   nitroGLYCERIN  (NITROSTAT ) 0.4 MG SL tablet   spironolactone  (ALDACTONE ) 25 MG tablet   losartan  (COZAAR ) 50 MG tablet   Hypertension associated with type 1 diabetes mellitus (HCC)   Chronic, ongoing.  BP near goal on recheck today.  Recommend he monitor BP at least a few mornings a week at home and document.  DASH diet at home.  Continue current medication regimen and adjust as needed.  Urine ALB 07 November 2022, continue Losartan  for kidney protection.  Labs today: CBC, CMP.         Relevant Medications   metoprolol  succinate (TOPROL -XL) 100 MG 24 hr tablet   nitroGLYCERIN  (NITROSTAT ) 0.4 MG SL tablet   spironolactone  (ALDACTONE ) 25 MG tablet   losartan  (COZAAR ) 50 MG tablet   Other Relevant Orders   CBC with Differential/Platelet   Chronic heart failure with preserved ejection fraction (HFpEF) (HCC)   Chronic, ongoing. Baseline edema today to BLE. Continue to collaborate with cardiology, may benefit HF Clinic in future. Continue all current medications. Recommend: - Reminded to call for an overnight weight gain of >2 pounds or a weekly weight gain of >5 pounds - not adding salt to food and read food labels. Reviewed the importance of keeping daily sodium intake to 2000mg  daily.  - Avoid Ibuprofen      Relevant Medications   metoprolol  succinate (TOPROL -XL) 100 MG 24 hr tablet   nitroGLYCERIN  (NITROSTAT ) 0.4 MG SL tablet   spironolactone  (ALDACTONE ) 25 MG tablet   losartan  (COZAAR ) 50 MG tablet     Respiratory    Moderate asthma without complication   Chronic, stable.  Continue Albuterol  as needed and in future start maintenance inhaler if worsening symptoms.  Return to pulmonary as needed.          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  A1c recent with endo 9.4% and urine ALB 18 Dec 2023.  Continue collaboration with endo, recent notes reviewed. Continue Amitriptyline  and Gabapentin  as ordered. Discussed with him importance of diabetes control to prevent worsening symptoms or wounds which could lead to loss of limb.  Have highly recommended he look into insulin  pump placement as endo has recommended and discussed at length the risks of poor diabetes control.   - ARB and Statin on board - Needs eye exam.  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  Up To Date on eye exam.  A1c recent with endo 9.4% with endo and urine ALB 18 Dec 2023.  Continue collaboration with endo, recent notes reviewed.      Relevant Medications   losartan  (COZAAR ) 50 MG tablet   Hyperlipidemia due to type 1 diabetes mellitus (HCC)   Chronic, ongoing.  Continue current medication regimen and adjust as needed.  Lipid panel today.      Relevant Medications   metoprolol  succinate (TOPROL -XL) 100 MG 24 hr tablet   nitroGLYCERIN  (NITROSTAT ) 0.4 MG SL tablet   spironolactone  (ALDACTONE ) 25 MG tablet   losartan  (COZAAR ) 50 MG tablet   Other Relevant Orders   Comprehensive metabolic panel with GFR   Lipid Panel w/o Chol/HDL Ratio   DM type 1 with diabetic peripheral neuropathy (HCC) - Primary   Chronic, ongoing.  A1c recent with endo 9.4% and urine ALB 18 Dec 2023.  Continue collaboration with endo, recent notes reviewed. Continue Amitriptyline  and Gabapentin  as ordered,  renal dose as needed. Discussed with him importance of diabetes control to prevent worsening symptoms or wounds which could lead to loss of limb.  Have highly recommended he look into insulin  pump placement as endo has recommended and discussed at length the risks of poor diabetes control.  He is going to call and schedule follow-up with vascular, highly recommended this. 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 - ARB and Statin on board - Needs eye exam.  Foot exam up to date - Vaccinations - refuses flu and pneumococcal vaccines      Relevant Medications   losartan  (COZAAR ) 50 MG tablet     Other   Morbid obesity (HCC)   BMI 42.12 with T1DM and CAD.  Recommended eating smaller high protein, low fat meals more frequently and exercising 30 mins a day 5 times a week with a goal of 10-15lb weight loss in the next 3 months. Patient voiced their understanding and motivation to adhere to these recommendations.       Insomnia   Chronic, ongoing. Continue current medication regimen. He is agreeable to sleep study after lengthy discussion with nurse today, will order home sleep study.      Depression   Chronic, stable.  Denies SI/HI.  Continue Amitriptyline  and consider SSRI in future, although concern for weight gain with these.  Monitor closely.        Other Visit Diagnoses       Snoring       Home sleep study ordered.   Relevant Orders   Ambulatory referral to Sleep Studies        Follow up plan: Return in about 3 months (around  09/28/2024) for T2DM, HTN/HLD, HF, Depression.

## 2024-06-28 NOTE — Assessment & Plan Note (Signed)
 Chronic, ongoing. Baseline edema today to BLE. Continue to collaborate with cardiology, may benefit HF Clinic in future. Continue all current medications. Recommend: - Reminded to call for an overnight weight gain of >2 pounds or a weekly weight gain of >5 pounds - not adding salt to food and read food labels. Reviewed the importance of keeping daily sodium intake to 2000mg  daily.  - Avoid Ibuprofen

## 2024-06-28 NOTE — Assessment & Plan Note (Signed)
 Chronic, ongoing. Continue current medication regimen. He is agreeable to sleep study after lengthy discussion with nurse today, will order home sleep study.

## 2024-06-28 NOTE — Assessment & Plan Note (Signed)
Chronic, stable.  Denies SI/HI.  Continue Amitriptyline and consider SSRI in future, although concern for weight gain with these.  Monitor closely.

## 2024-06-28 NOTE — Assessment & Plan Note (Signed)
Chronic, stable.  Continue Albuterol as needed and in future start maintenance inhaler if worsening symptoms.  Return to pulmonary as needed.

## 2024-06-28 NOTE — Assessment & Plan Note (Signed)
 Chronic, ongoing.  Continue current medication regimen and adjust as needed. Lipid panel today.

## 2024-06-28 NOTE — Assessment & Plan Note (Signed)
 Chronic, ongoing.  A1c recent with endo 9.4% and urine ALB 18 Dec 2023.  Continue collaboration with endo, recent notes reviewed. Continue Amitriptyline  and Gabapentin  as ordered. Discussed with him importance of diabetes control to prevent worsening symptoms or wounds which could lead to loss of limb.  Have highly recommended he look into insulin  pump placement as endo has recommended and discussed at length the risks of poor diabetes control.  He is going to call and schedule follow-up with vascular, highly recommended this. 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 - ARB and Statin on board - Needs eye exam.  Foot exam up to date - Vaccinations - refuses flu and pneumococcal vaccines

## 2024-06-28 NOTE — Assessment & Plan Note (Addendum)
 Chronic, ongoing.  A1c recent with endo 9.4% and urine ALB 18 Dec 2023.  Continue collaboration with endo, recent notes reviewed. Continue Amitriptyline  and Gabapentin  as ordered, renal dose as needed. Discussed with him importance of diabetes control to prevent worsening symptoms or wounds which could lead to loss of limb.  Have highly recommended he look into insulin  pump placement as endo has recommended and discussed at length the risks of poor diabetes control.  He is going to call and schedule follow-up with vascular, highly recommended this. 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 - ARB and Statin on board - Needs eye exam.  Foot exam up to date - Vaccinations - refuses flu and pneumococcal vaccines

## 2024-06-28 NOTE — Assessment & Plan Note (Signed)
 Chronic, ongoing.  A1c recent with endo 9.4% and urine ALB 18 Dec 2023.  Continue collaboration with endo, recent notes reviewed. Continue Amitriptyline  and Gabapentin  as ordered. Discussed with him importance of diabetes control to prevent worsening symptoms or wounds which could lead to loss of limb.  Have highly recommended he look into insulin  pump placement as endo has recommended and discussed at length the risks of poor diabetes control.   - ARB and Statin on board - Needs eye exam.  Foot exam up to date - Vaccinations - refuses flu and pneumococcal vaccines

## 2024-06-28 NOTE — Assessment & Plan Note (Signed)
 Chronic, ongoing.  BP near goal on recheck today.  Recommend he monitor BP at least a few mornings a week at home and document.  DASH diet at home.  Continue current medication regimen and adjust as needed.  Urine ALB 07 November 2022, continue Losartan  for kidney protection.  Labs today: CBC, CMP.

## 2024-06-28 NOTE — Addendum Note (Signed)
 Addended by: Amalie Koran M on: 06/28/2024 03:11 PM   Modules accepted: Orders

## 2024-06-29 ENCOUNTER — Ambulatory Visit: Payer: Self-pay | Admitting: Nurse Practitioner

## 2024-06-29 LAB — CBC WITH DIFFERENTIAL/PLATELET
Basophils Absolute: 0 x10E3/uL (ref 0.0–0.2)
Basos: 1 %
EOS (ABSOLUTE): 0.2 x10E3/uL (ref 0.0–0.4)
Eos: 3 %
Hematocrit: 42.2 % (ref 37.5–51.0)
Hemoglobin: 13.3 g/dL (ref 13.0–17.7)
Immature Grans (Abs): 0.1 x10E3/uL (ref 0.0–0.1)
Immature Granulocytes: 1 %
Lymphocytes Absolute: 1.1 x10E3/uL (ref 0.7–3.1)
Lymphs: 18 %
MCH: 26.7 pg (ref 26.6–33.0)
MCHC: 31.5 g/dL (ref 31.5–35.7)
MCV: 85 fL (ref 79–97)
Monocytes Absolute: 0.5 x10E3/uL (ref 0.1–0.9)
Monocytes: 8 %
Neutrophils Absolute: 4.2 x10E3/uL (ref 1.4–7.0)
Neutrophils: 69 %
Platelets: 235 x10E3/uL (ref 150–450)
RBC: 4.99 x10E6/uL (ref 4.14–5.80)
RDW: 16.2 % — ABNORMAL HIGH (ref 11.6–15.4)
WBC: 6 x10E3/uL (ref 3.4–10.8)

## 2024-06-29 LAB — COMPREHENSIVE METABOLIC PANEL WITH GFR
ALT: 24 IU/L (ref 0–44)
AST: 20 IU/L (ref 0–40)
Albumin: 4.4 g/dL (ref 3.8–4.9)
Alkaline Phosphatase: 133 IU/L — ABNORMAL HIGH (ref 47–123)
BUN/Creatinine Ratio: 15 (ref 9–20)
BUN: 23 mg/dL (ref 6–24)
Bilirubin Total: 0.2 mg/dL (ref 0.0–1.2)
CO2: 22 mmol/L (ref 20–29)
Calcium: 9.2 mg/dL (ref 8.7–10.2)
Chloride: 98 mmol/L (ref 96–106)
Creatinine, Ser: 1.5 mg/dL — ABNORMAL HIGH (ref 0.76–1.27)
Globulin, Total: 2.3 g/dL (ref 1.5–4.5)
Glucose: 258 mg/dL — ABNORMAL HIGH (ref 70–99)
Potassium: 4.9 mmol/L (ref 3.5–5.2)
Sodium: 135 mmol/L (ref 134–144)
Total Protein: 6.7 g/dL (ref 6.0–8.5)
eGFR: 55 mL/min/1.73 — ABNORMAL LOW (ref >59)

## 2024-06-29 LAB — LIPID PANEL W/O CHOL/HDL RATIO
Cholesterol, Total: 127 mg/dL (ref 100–199)
HDL: 40 mg/dL (ref >39)
LDL Chol Calc (NIH): 62 mg/dL (ref 0–99)
Triglycerides: 146 mg/dL (ref 0–149)
VLDL Cholesterol Cal: 25 mg/dL (ref 5–40)

## 2024-06-29 NOTE — Progress Notes (Signed)
 Contacted via MyChart  Good morning Mike Kline, your labs have returned: - Kidney function, creatinine and eGFR, shows Stage 3a kidney disease. We will continue to monitor this at visits. Goal is to attain good diabetes control. Liver function, AST and ALT, is normal. - CBC overall stable with no anemia or infection. - Lipid panel shows levels at goal. Any questions? Keep being amazing!!  Thank you for allowing me to participate in your care.  I appreciate you. Kindest regards, Shelia Magallon

## 2024-07-04 NOTE — Progress Notes (Signed)
 TYDUS SANMIGUEL                                          MRN: [id number]   07/04/2024   The [facility] Quality Team Specialist reviewed this patient medical record for the purposes of chart review for care gap closure. The following were reviewed: chart review for care gap closure-glycemic status assessment.    [facility] Quality Team

## 2024-07-10 ENCOUNTER — Encounter: Payer: Self-pay | Admitting: Nurse Practitioner

## 2024-07-17 NOTE — Progress Notes (Signed)
 GUALBERTO WAHLEN                                          MRN: [id number]   07/17/2024   The [facility] Quality Team Specialist reviewed this patient medical record for the purposes of chart review for care gap closure. The following were reviewed: chart review for care gap closure-glycemic status assessment.    [facility] Quality Team

## 2024-07-26 NOTE — Progress Notes (Signed)
 Mike Kline                                          MRN: [id number]   07/26/2024   The [facility] Quality Team Specialist reviewed this patient medical record for the purposes of chart review for care gap closure. The following were reviewed: chart review for care gap closure-glycemic status assessment.    [facility] Quality Team

## 2024-08-10 ENCOUNTER — Encounter: Payer: Self-pay | Admitting: Nurse Practitioner

## 2024-08-10 ENCOUNTER — Other Ambulatory Visit: Payer: Self-pay | Admitting: Nurse Practitioner

## 2024-08-10 DIAGNOSIS — G4733 Obstructive sleep apnea (adult) (pediatric): Secondary | ICD-10-CM | POA: Insufficient documentation

## 2024-08-11 NOTE — Telephone Encounter (Signed)
 I put in the order for the CPAP, pending providers signature.

## 2024-08-14 NOTE — Progress Notes (Signed)
 Mike Kline                                          MRN: [id number]   08/14/2024   The [facility] Quality Team Specialist reviewed this patient medical record for the purposes of chart review for care gap closure. The following were reviewed: chart review for care gap closure-glycemic status assessment.    [facility] Quality Team

## 2024-09-29 ENCOUNTER — Ambulatory Visit: Admitting: Nurse Practitioner

## 2025-01-30 ENCOUNTER — Ambulatory Visit
